# Patient Record
Sex: Female | Born: 1955 | Race: White | Hispanic: No | Marital: Married | Smoking: Never smoker
Health system: Southern US, Community
[De-identification: ages and names within clinical notes are randomized; demographics above are authoritative.]

## PROBLEM LIST (undated history)

## (undated) DIAGNOSIS — R12 Heartburn: Secondary | ICD-10-CM

## (undated) DIAGNOSIS — E785 Hyperlipidemia, unspecified: Secondary | ICD-10-CM

## (undated) DIAGNOSIS — T7840XA Allergy, unspecified, initial encounter: Secondary | ICD-10-CM

## (undated) DIAGNOSIS — F419 Anxiety disorder, unspecified: Secondary | ICD-10-CM

## (undated) DIAGNOSIS — K5790 Diverticulosis of intestine, part unspecified, without perforation or abscess without bleeding: Secondary | ICD-10-CM

## (undated) DIAGNOSIS — F909 Attention-deficit hyperactivity disorder, unspecified type: Secondary | ICD-10-CM

## (undated) DIAGNOSIS — K635 Polyp of colon: Secondary | ICD-10-CM

## (undated) DIAGNOSIS — K219 Gastro-esophageal reflux disease without esophagitis: Secondary | ICD-10-CM

## (undated) HISTORY — PX: UPPER GI ENDOSCOPY: SHX6162

## (undated) HISTORY — DX: Hyperlipidemia, unspecified: E78.5

## (undated) HISTORY — DX: Anxiety disorder, unspecified: F41.9

## (undated) HISTORY — PX: TOE SURGERY: SHX1073

## (undated) HISTORY — DX: Heartburn: R12

## (undated) HISTORY — DX: Gastro-esophageal reflux disease without esophagitis: K21.9

## (undated) HISTORY — PX: COLONOSCOPY: SHX174

## (undated) HISTORY — DX: Allergy, unspecified, initial encounter: T78.40XA

---

## 2005-02-05 ENCOUNTER — Ambulatory Visit: Payer: Self-pay | Admitting: Internal Medicine

## 2006-05-20 ENCOUNTER — Ambulatory Visit: Payer: Self-pay | Admitting: Internal Medicine

## 2007-10-14 ENCOUNTER — Ambulatory Visit: Payer: Self-pay | Admitting: Internal Medicine

## 2008-04-22 ENCOUNTER — Ambulatory Visit: Payer: Self-pay | Admitting: Unknown Physician Specialty

## 2009-10-04 ENCOUNTER — Ambulatory Visit: Payer: Self-pay | Admitting: Internal Medicine

## 2010-06-18 ENCOUNTER — Ambulatory Visit: Payer: Self-pay | Admitting: Unknown Physician Specialty

## 2010-06-20 LAB — PATHOLOGY REPORT

## 2010-11-05 ENCOUNTER — Ambulatory Visit: Payer: Self-pay

## 2011-02-08 ENCOUNTER — Ambulatory Visit: Payer: Self-pay | Admitting: Internal Medicine

## 2011-03-05 ENCOUNTER — Ambulatory Visit: Payer: Self-pay

## 2011-05-31 ENCOUNTER — Ambulatory Visit: Payer: Self-pay | Admitting: Internal Medicine

## 2011-06-03 ENCOUNTER — Ambulatory Visit: Payer: Self-pay | Admitting: Unknown Physician Specialty

## 2012-03-24 ENCOUNTER — Ambulatory Visit: Payer: Self-pay

## 2012-04-01 ENCOUNTER — Ambulatory Visit: Payer: Self-pay

## 2013-09-03 ENCOUNTER — Ambulatory Visit: Payer: Self-pay | Admitting: Internal Medicine

## 2014-01-07 ENCOUNTER — Ambulatory Visit: Payer: Self-pay

## 2014-05-24 ENCOUNTER — Ambulatory Visit: Payer: Self-pay

## 2017-03-17 ENCOUNTER — Other Ambulatory Visit: Payer: Self-pay | Admitting: Nurse Practitioner

## 2017-03-17 DIAGNOSIS — Z1231 Encounter for screening mammogram for malignant neoplasm of breast: Secondary | ICD-10-CM

## 2017-07-02 ENCOUNTER — Other Ambulatory Visit: Payer: Self-pay | Admitting: Nurse Practitioner

## 2017-07-02 ENCOUNTER — Ambulatory Visit: Payer: Managed Care, Other (non HMO) | Admitting: Internal Medicine

## 2017-07-02 DIAGNOSIS — K219 Gastro-esophageal reflux disease without esophagitis: Secondary | ICD-10-CM

## 2017-07-02 MED ORDER — PANTOPRAZOLE SODIUM 40 MG PO TBEC: 40.0000 mg | DELAYED_RELEASE_TABLET | Freq: Every day | ORAL | 3 refills | Status: DC

## 2017-07-02 NOTE — Progress Notes (Signed)
Added pantoprazole 40mg  daily per [facility] prescription. Sent to [facility].

## 2017-07-11 ENCOUNTER — Ambulatory Visit: Payer: Self-pay | Admitting: Nurse Practitioner

## 2017-07-21 ENCOUNTER — Encounter: Payer: Self-pay | Admitting: Nurse Practitioner

## 2017-07-21 ENCOUNTER — Ambulatory Visit (INDEPENDENT_AMBULATORY_CARE_PROVIDER_SITE_OTHER): Payer: Managed Care, Other (non HMO) | Admitting: Nurse Practitioner

## 2017-07-21 VITALS — BP 133/85 | HR 64 | Resp 16 | Ht 62.0 in | Wt 178.0 lb

## 2017-07-21 DIAGNOSIS — N390 Urinary tract infection, site not specified: Secondary | ICD-10-CM

## 2017-07-21 DIAGNOSIS — R41840 Attention and concentration deficit: Secondary | ICD-10-CM

## 2017-07-21 DIAGNOSIS — F411 Generalized anxiety disorder: Secondary | ICD-10-CM | POA: Diagnosis not present

## 2017-07-21 DIAGNOSIS — R319 Hematuria, unspecified: Secondary | ICD-10-CM | POA: Diagnosis not present

## 2017-07-21 DIAGNOSIS — R300 Dysuria: Secondary | ICD-10-CM

## 2017-07-21 DIAGNOSIS — R5383 Other fatigue: Secondary | ICD-10-CM

## 2017-07-21 DIAGNOSIS — M545 Low back pain, unspecified: Secondary | ICD-10-CM

## 2017-07-21 DIAGNOSIS — N3001 Acute cystitis with hematuria: Secondary | ICD-10-CM

## 2017-07-21 DIAGNOSIS — E782 Mixed hyperlipidemia: Secondary | ICD-10-CM

## 2017-07-21 LAB — POCT URINALYSIS DIPSTICK
BILIRUBIN UA: NEGATIVE
GLUCOSE UA: NEGATIVE
KETONES UA: NEGATIVE
Leukocytes, UA: NEGATIVE
Nitrite, UA: NEGATIVE
SPEC GRAV UA: 1.01 (ref 1.010–1.025)
Urobilinogen, UA: 0.2 E.U./dL
pH, UA: 5 (ref 5.0–8.0)

## 2017-07-21 MED ORDER — FENOFIBRATE 54 MG PO TABS: 54.0000 mg | ORAL_TABLET | Freq: Every day | ORAL | 5 refills | Status: DC

## 2017-07-21 MED ORDER — AMPHETAMINE-DEXTROAMPHETAMINE 5 MG PO TABS
5.0000 mg | ORAL_TABLET | Freq: Two times a day (BID) | ORAL | 0 refills | Status: DC
Start: 2017-07-21 — End: 2017-07-21

## 2017-07-21 MED ORDER — ATORVASTATIN CALCIUM 40 MG PO TABS: 40.0000 mg | ORAL_TABLET | Freq: Every day | ORAL | 5 refills | Status: DC

## 2017-07-21 MED ORDER — TRAMADOL HCL 50 MG PO TABS: 50.0000 mg | ORAL_TABLET | Freq: Four times a day (QID) | ORAL | 0 refills | Status: DC | PRN

## 2017-07-21 MED ORDER — ESCITALOPRAM OXALATE 20 MG PO TABS: 20.0000 mg | ORAL_TABLET | Freq: Every day | ORAL | 5 refills | Status: DC

## 2017-07-21 MED ORDER — AMPHETAMINE-DEXTROAMPHETAMINE 5 MG PO TABS: 5.0000 mg | ORAL_TABLET | Freq: Two times a day (BID) | ORAL | 0 refills | Status: DC

## 2017-07-21 MED ORDER — AMOXICILLIN-POT CLAVULANATE 875-125 MG PO TABS: 1.0000 | ORAL_TABLET | Freq: Two times a day (BID) | ORAL | 0 refills | Status: DC

## 2017-07-21 MED ORDER — CLONAZEPAM 0.5 MG PO TABS: 0.5000 mg | ORAL_TABLET | Freq: Two times a day (BID) | ORAL | 2 refills | Status: DC | PRN

## 2017-07-21 NOTE — Progress Notes (Signed)
[facility] [place], [place] [address]  Internal MEDICINE  Office Visit Note  Patient Name: [name]  [id number]  [id number]  Date of Service: 07/30/2017  Chief Complaint  Patient presents with  . Back Pain  . Urinary Tract Infection    Back Pain  This is a recurrent problem. The current episode started more than 1 month ago. The problem occurs constantly. The problem has been gradually worsening since onset. The pain is present in the lumbar spine. The quality of the pain is described as aching. The pain radiates to the left thigh and right thigh. Exacerbated by: exertion. Stiffness is present in the morning. Associated symptoms include dysuria, headaches, leg pain and paresthesias. Pertinent negatives include no abdominal pain, chest pain or numbness. (Bilateral knee/thigh pain) Risk factors include menopause. She has tried NSAIDs for the symptoms. The treatment provided moderate relief.  Urinary Tract Infection   This is a recurrent problem. The current episode started in the past 7 days. The problem occurs intermittently. The problem has been gradually worsening. There has been no fever. She is sexually active. There is no history of pyelonephritis. Associated symptoms include flank pain, frequency, hesitancy and nausea. Pertinent negatives include no chills or vomiting. She has tried nothing for the symptoms. Her past medical history is significant for recurrent UTIs.    Pt is here for routine follow up.    Current Medication: Outpatient Encounter Medications as of 07/21/2017  Medication Sig  . amphetamine-dextroamphetamine (ADDERALL) 5 MG tablet Take 1 tablet (5 mg total) by mouth 2 (two) times daily with a meal.  . atorvastatin (LIPITOR) 40 MG tablet Take 1 tablet (40 mg total) by mouth daily at 6 PM.  . clonazePAM (KLONOPIN) 0.5 MG tablet Take 1 tablet (0.5 mg total) by mouth 2 (two) times daily as needed for anxiety.  . cyclobenzaprine (FLEXERIL)  10 MG tablet cyclobenzaprine 10 mg tablet  1 POQ 8 hrs prn muscle tightness  . escitalopram (LEXAPRO) 20 MG tablet Take 1 tablet (20 mg total) by mouth daily.  . fenofibrate 54 MG tablet Take 1 tablet (54 mg total) by mouth daily.  . fluticasone (FLONASE) 50 MCG/ACT nasal spray fluticasone 50 mcg/actuation nasal spray,suspension  . pantoprazole (PROTONIX) 40 MG tablet Take 1 tablet (40 mg total) by mouth daily.  . [DISCONTINUED] amphetamine-dextroamphetamine (ADDERALL) 5 MG tablet dextroamphetamine-amphetamine 5 mg tablet  . [DISCONTINUED] amphetamine-dextroamphetamine (ADDERALL) 5 MG tablet Take 1 tablet (5 mg total) by mouth 2 (two) times daily with a meal.  . [DISCONTINUED] amphetamine-dextroamphetamine (ADDERALL) 5 MG tablet Take 1 tablet (5 mg total) by mouth 2 (two) times daily with a meal.  . [DISCONTINUED] atorvastatin (LIPITOR) 40 MG tablet atorvastatin 40 mg tablet  . [DISCONTINUED] clonazePAM (KLONOPIN) 0.5 MG tablet clonazepam 0.5 mg tablet  . [DISCONTINUED] escitalopram (LEXAPRO) 20 MG tablet escitalopram 20 mg tablet  . [DISCONTINUED] fenofibrate 54 MG tablet fenofibrate 54 mg tablet  . amoxicillin-clavulanate (AUGMENTIN) 875-125 MG tablet Take 1 tablet by mouth 2 (two) times daily.  . traMADol (ULTRAM) 50 MG tablet Take 1 tablet (50 mg total) by mouth every 6 (six) hours as needed for moderate pain.   No facility-administered encounter medications on file as of 07/21/2017.     Surgical History: Past Surgical History:  Procedure Laterality Date  . TOE SURGERY  2005,2011    Medical History: Past Medical History:  Diagnosis Date  . Allergy   . Heart burn   . Hyperlipidemia  Family History: Family History  Problem Relation Age of Onset  . Lung cancer Mother   . Heart disease Father   . Pancreatic cancer Sister     Social History   Socioeconomic History  . Marital status: Married    Spouse name: Not on file  . Number of children: Not on file  . Years of  education: Not on file  . Highest education level: Not on file  Social Needs  . Financial resource strain: Not on file  . Food insecurity - worry: Not on file  . Food insecurity - inability: Not on file  . Transportation needs - medical: Not on file  . Transportation needs - non-medical: Not on file  Occupational History  . Not on file  Tobacco Use  . Smoking status: Never Smoker  . Smokeless tobacco: Never Used  Substance and Sexual Activity  . Alcohol use: No    Frequency: Never  . Drug use: No  . Sexual activity: Not on file  Other Topics Concern  . Not on file  Social History Narrative  . Not on file      Review of Systems  Constitutional: Positive for activity change. Negative for chills, fatigue and unexpected weight change.  HENT: Negative for congestion, postnasal drip, rhinorrhea, sneezing and sore throat.   Eyes: Negative for redness.  Respiratory: Negative for cough, chest tightness, shortness of breath and wheezing.   Cardiovascular: Negative for chest pain and palpitations.  Gastrointestinal: Positive for nausea. Negative for abdominal pain, constipation, diarrhea and vomiting.  Genitourinary: Positive for dysuria, flank pain, frequency and hesitancy.  Musculoskeletal: Positive for arthralgias and back pain. Negative for joint swelling and neck pain.       Bilateral knee pain   Skin: Negative for rash.  Neurological: Positive for headaches and paresthesias. Negative for tremors and numbness.  Hematological: Negative for adenopathy. Does not bruise/bleed easily.  Psychiatric/Behavioral: Positive for behavioral problems (Depression) and decreased concentration. Negative for sleep disturbance and suicidal ideas. The patient is nervous/anxious.    Today's Vitals   07/21/17 1520  BP: 133/85  Pulse: 64  Resp: 16  SpO2: 95%  Weight: 178 lb (80.7 kg)  Height: 5\' 2"  (1.575 m)   . Physical Exam  Constitutional: She is oriented to person, place, and time. She  appears well-developed and well-nourished. No distress.  HENT:  Head: Normocephalic and atraumatic.  Mouth/Throat: Oropharynx is clear and moist. No oropharyngeal exudate.  Eyes: EOM are normal. Pupils are equal, round, and reactive to light.  Neck: Normal range of motion. Neck supple. No JVD present. No tracheal deviation present. No thyromegaly present.  Cardiovascular: Normal rate, regular rhythm and normal heart sounds. Exam reveals no gallop and no friction rub.  No murmur heard. Pulmonary/Chest: Effort normal and breath sounds normal. No respiratory distress. She has no wheezes. She has no rales. She exhibits no tenderness.  Abdominal: Soft. Bowel sounds are normal. There is no tenderness.  Genitourinary:  Genitourinary Comments: Urine sample positive for trace WBC and moderate blood.  Musculoskeletal:  Moderate lower back pain, more severe with bending and twisting at the waist. Radiates to the hips. No viaible or palpable abnormalities or deformities noted today.  Lymphadenopathy:    She has no cervical adenopathy.  Neurological: She is alert and oriented to person, place, and time. No cranial nerve deficit.  Skin: Skin is warm and dry. She is not diaphoretic.  Psychiatric: Her behavior is normal. Judgment and thought content normal. Her mood appears anxious.  Cognition and memory are normal.  Nursing note and vitals reviewed.  Assessment/Plan: 1. Urinary tract infection with hematuria, site unspecified Start abx bid for 10 days. Send urine for culture and sensitivity and adjust abx as indicated.  - CULTURE, URINE COMPREHENSIVE  2. Acute cystitis with hematuria - amoxicillin-clavulanate (AUGMENTIN) 875-125 MG tablet; Take 1 tablet by mouth 2 (two) times daily.  Dispense: 20 tablet; Refill: 0  3. Low back pain at multiple sites Likely related to UTI. May take tramadol as needed and as prescribed.  - traMADol (ULTRAM) 50 MG tablet; Take 1 tablet (50 mg total) by mouth every 6  (six) hours as needed for moderate pain.  Dispense: 30 tablet; Refill: 0 - CBC with Differential/Platelet  4. Dysuria - POCT Urinalysis Dipstick  5. Mixed hyperlipidemia - atorvastatin (LIPITOR) 40 MG tablet; Take 1 tablet (40 mg total) by mouth daily at 6 PM.  Dispense: 30 tablet; Refill: 5 - fenofibrate 54 MG tablet; Take 1 tablet (54 mg total) by mouth daily.  Dispense: 30 tablet; Refill: 5 - CBC with Differential/Platelet - Comprehensive metabolic panel - T4, free - Lipid panel  6. Attention and concentration deficit May continue adderall 5mg  bid as needed and as prescribed. Three 30 day prescriptions provided today. Dates are 07/21/2017, 08/18/2017, and 4/16/20169 - amphetamine-dextroamphetamine (ADDERALL) 5 MG tablet; Take 1 tablet (5 mg total) by mouth 2 (two) times daily with a meal.  Dispense: 60 tablet; Refill: 0  7. GAD (generalized anxiety disorder) Stable.  - clonazePAM (KLONOPIN) 0.5 MG tablet; Take 1 tablet (0.5 mg total) by mouth 2 (two) times daily as needed for anxiety.  Dispense: 60 tablet; Refill: 2 - escitalopram (LEXAPRO) 20 MG tablet; Take 1 tablet (20 mg total) by mouth daily.  Dispense: 30 tablet; Refill: 5  8. Other fatigue Check labs  - T4, free - TSH  General Counseling: Tempie verbalizes understanding of the findings of todays visit and agrees with plan of treatment. I have discussed any further diagnostic evaluation that may be needed or ordered today. We also reviewed her medications today. she has been encouraged to call the office with any questions or concerns that should arise related to todays visit.   This patient was seen by Leretha Pol, FNP- C in Collaboration with Dr Lavera Guise as a part of collaborative care agreement    Orders Placed This Encounter  Procedures  . CULTURE, URINE COMPREHENSIVE  . CBC with Differential/Platelet  . Comprehensive metabolic panel  . T4, free  . TSH  . Lipid panel  . POCT Urinalysis Dipstick    Meds  ordered this encounter  Medications  . DISCONTD: amphetamine-dextroamphetamine (ADDERALL) 5 MG tablet    Sig: Take 1 tablet (5 mg total) by mouth 2 (two) times daily with a meal.    Dispense:  60 tablet    Refill:  0    Order Specific Question:   Supervising Provider    Answer:   Lavera Guise [place]  . clonazePAM (KLONOPIN) 0.5 MG tablet    Sig: Take 1 tablet (0.5 mg total) by mouth 2 (two) times daily as needed for anxiety.    Dispense:  60 tablet    Refill:  2    Order Specific Question:   Supervising Provider    Answer:   Lavera Guise [[id number]]  . atorvastatin (LIPITOR) 40 MG tablet    Sig: Take 1 tablet (40 mg total) by mouth daily at 6 PM.    Dispense:  30  tablet    Refill:  5    Order Specific Question:   Supervising Provider    Answer:   Lavera Guise [[id number]]  . escitalopram (LEXAPRO) 20 MG tablet    Sig: Take 1 tablet (20 mg total) by mouth daily.    Dispense:  30 tablet    Refill:  5    Order Specific Question:   Supervising Provider    Answer:   Lavera Guise [[id number]]  . fenofibrate 54 MG tablet    Sig: Take 1 tablet (54 mg total) by mouth daily.    Dispense:  30 tablet    Refill:  5    Order Specific Question:   Supervising Provider    Answer:   Lavera Guise [[id number]]  . amoxicillin-clavulanate (AUGMENTIN) 875-125 MG tablet    Sig: Take 1 tablet by mouth 2 (two) times daily.    Dispense:  20 tablet    Refill:  0    Order Specific Question:   Supervising Provider    Answer:   Lavera Guise [[id number]]  . traMADol (ULTRAM) 50 MG tablet    Sig: Take 1 tablet (50 mg total) by mouth every 6 (six) hours as needed for moderate pain.    Dispense:  30 tablet    Refill:  0    Order Specific Question:   Supervising Provider    Answer:   Lavera Guise [[id number]]  . DISCONTD: amphetamine-dextroamphetamine (ADDERALL) 5 MG tablet    Sig: Take 1 tablet (5 mg total) by mouth 2 (two) times daily with a meal.    Dispense:  60 tablet    Refill:  0    Fill after 08/18/2017    Order Specific  Question:   Supervising Provider    Answer:   Lavera Guise [place]  . amphetamine-dextroamphetamine (ADDERALL) 5 MG tablet    Sig: Take 1 tablet (5 mg total) by mouth 2 (two) times daily with a meal.    Dispense:  60 tablet    Refill:  0    Fill after 09/16/2017    Order Specific Question:   Supervising Provider    Answer:   Lavera Guise [[id number]]    Time spent: 25 Minutes     Dr Lavera Guise Internal medicine

## 2017-07-25 LAB — CULTURE, URINE COMPREHENSIVE

## 2017-07-30 ENCOUNTER — Encounter: Payer: Self-pay | Admitting: Nurse Practitioner

## 2017-07-30 DIAGNOSIS — M545 Low back pain, unspecified: Secondary | ICD-10-CM | POA: Insufficient documentation

## 2017-07-30 DIAGNOSIS — F411 Generalized anxiety disorder: Secondary | ICD-10-CM | POA: Insufficient documentation

## 2017-07-30 DIAGNOSIS — N3001 Acute cystitis with hematuria: Secondary | ICD-10-CM | POA: Insufficient documentation

## 2017-07-30 DIAGNOSIS — R300 Dysuria: Secondary | ICD-10-CM | POA: Insufficient documentation

## 2017-07-30 DIAGNOSIS — E782 Mixed hyperlipidemia: Secondary | ICD-10-CM | POA: Insufficient documentation

## 2017-07-30 DIAGNOSIS — R319 Hematuria, unspecified: Secondary | ICD-10-CM

## 2017-07-30 DIAGNOSIS — N390 Urinary tract infection, site not specified: Secondary | ICD-10-CM | POA: Insufficient documentation

## 2017-07-30 DIAGNOSIS — R41840 Attention and concentration deficit: Secondary | ICD-10-CM | POA: Insufficient documentation

## 2017-07-30 DIAGNOSIS — R5383 Other fatigue: Secondary | ICD-10-CM | POA: Insufficient documentation

## 2017-08-26 LAB — COMPREHENSIVE METABOLIC PANEL
ALBUMIN: 4.3 g/dL (ref 3.6–4.8)
ALT: 23 IU/L (ref 0–32)
AST: 20 IU/L (ref 0–40)
Albumin/Globulin Ratio: 2 (ref 1.2–2.2)
Alkaline Phosphatase: 83 IU/L (ref 39–117)
BUN/Creatinine Ratio: 22 (ref 12–28)
BUN: 14 mg/dL (ref 8–27)
CALCIUM: 9.1 mg/dL (ref 8.7–10.3)
CHLORIDE: 104 mmol/L (ref 96–106)
CO2: 22 mmol/L (ref 20–29)
CREATININE: 0.65 mg/dL (ref 0.57–1.00)
GFR, EST AFRICAN AMERICAN: 110 mL/min/{1.73_m2} (ref >59)
GFR, EST NON AFRICAN AMERICAN: 95 mL/min/{1.73_m2} (ref >59)
GLUCOSE: 101 mg/dL — AB (ref 65–99)
Globulin, Total: 2.1 g/dL (ref 1.5–4.5)
Potassium: 4.4 mmol/L (ref 3.5–5.2)
Sodium: 140 mmol/L (ref 134–144)
TOTAL PROTEIN: 6.4 g/dL (ref 6.0–8.5)

## 2017-08-26 LAB — LIPID PANEL
Chol/HDL Ratio: 4.3 ratio (ref 0.0–4.4)
Cholesterol, Total: 188 mg/dL (ref 100–199)
HDL: 44 mg/dL (ref >39)
LDL CALC: 86 mg/dL (ref 0–99)
TRIGLYCERIDES: 291 mg/dL — AB (ref 0–149)
VLDL CHOLESTEROL CAL: 58 mg/dL — AB (ref 5–40)

## 2017-08-26 LAB — CBC WITH DIFFERENTIAL/PLATELET
BASOS ABS: 0 10*3/uL (ref 0.0–0.2)
Basos: 0 %
EOS (ABSOLUTE): 0.2 10*3/uL (ref 0.0–0.4)
Eos: 3 %
HEMOGLOBIN: 14.3 g/dL (ref 11.1–15.9)
Hematocrit: 41.5 % (ref 34.0–46.6)
IMMATURE GRANS (ABS): 0 10*3/uL (ref 0.0–0.1)
IMMATURE GRANULOCYTES: 1 %
LYMPHS: 39 %
Lymphocytes Absolute: 2.6 10*3/uL (ref 0.7–3.1)
MCH: 31.3 pg (ref 26.6–33.0)
MCHC: 34.5 g/dL (ref 31.5–35.7)
MCV: 91 fL (ref 79–97)
MONOCYTES: 8 %
Monocytes Absolute: 0.5 10*3/uL (ref 0.1–0.9)
NEUTROS ABS: 3.4 10*3/uL (ref 1.4–7.0)
NEUTROS PCT: 49 %
PLATELETS: 300 10*3/uL (ref 150–379)
RBC: 4.57 x10E6/uL (ref 3.77–5.28)
RDW: 13.8 % (ref 12.3–15.4)
WBC: 6.8 10*3/uL (ref 3.4–10.8)

## 2017-08-26 LAB — TSH: TSH: 0.978 u[IU]/mL (ref 0.450–4.500)

## 2017-08-26 LAB — T4, FREE: Free T4: 0.93 ng/dL (ref 0.82–1.77)

## 2017-09-02 ENCOUNTER — Other Ambulatory Visit: Payer: Self-pay | Admitting: Nurse Practitioner

## 2017-09-02 DIAGNOSIS — R112 Nausea with vomiting, unspecified: Secondary | ICD-10-CM

## 2017-09-08 ENCOUNTER — Ambulatory Visit: Payer: Managed Care, Other (non HMO)

## 2017-10-20 ENCOUNTER — Ambulatory Visit (INDEPENDENT_AMBULATORY_CARE_PROVIDER_SITE_OTHER): Payer: Managed Care, Other (non HMO) | Admitting: Nurse Practitioner

## 2017-10-20 VITALS — BP 129/85 | HR 66 | Resp 16 | Ht 62.0 in | Wt 179.2 lb

## 2017-10-20 DIAGNOSIS — F411 Generalized anxiety disorder: Secondary | ICD-10-CM | POA: Diagnosis not present

## 2017-10-20 DIAGNOSIS — K219 Gastro-esophageal reflux disease without esophagitis: Secondary | ICD-10-CM

## 2017-10-20 DIAGNOSIS — E782 Mixed hyperlipidemia: Secondary | ICD-10-CM | POA: Diagnosis not present

## 2017-10-20 DIAGNOSIS — R41840 Attention and concentration deficit: Secondary | ICD-10-CM | POA: Diagnosis not present

## 2017-10-20 MED ORDER — ESCITALOPRAM OXALATE 20 MG PO TABS: 20.0000 mg | ORAL_TABLET | Freq: Every day | ORAL | 5 refills | Status: DC

## 2017-10-20 MED ORDER — AMPHETAMINE-DEXTROAMPHETAMINE 5 MG PO TABS: 5.0000 mg | ORAL_TABLET | Freq: Two times a day (BID) | ORAL | 0 refills | Status: DC

## 2017-10-20 MED ORDER — CLONAZEPAM 0.5 MG PO TABS: 0.5000 mg | ORAL_TABLET | Freq: Two times a day (BID) | ORAL | 2 refills | Status: DC | PRN

## 2017-10-20 MED ORDER — ATORVASTATIN CALCIUM 40 MG PO TABS: 40.0000 mg | ORAL_TABLET | Freq: Every day | ORAL | 5 refills | Status: DC

## 2017-10-20 MED ORDER — AMPHETAMINE-DEXTROAMPHETAMINE 5 MG PO TABS
5.0000 mg | ORAL_TABLET | Freq: Two times a day (BID) | ORAL | 0 refills | Status: DC
Start: 2017-10-20 — End: 2018-01-30

## 2017-10-20 NOTE — Progress Notes (Signed)
[facility] [place], [place] [address]  Internal MEDICINE  Office Visit Note  Patient Name: [name]  [id number]  [id number]  Date of Service: 11/12/2017   Pt is here for routine follow up.   Chief Complaint  Patient presents with  . Hyperlipidemia  . Anxiety    The patient has long history of anxiety/panic attacks. She is currently taking lexapro 20mg  daily and will use clonazpeam twice daily as needed for acute anxiety. She takes this mostly in the evening if needed.  She recently had labs done. Her triglycerides were elevated. She admits she has not been taking her medications to control cholesterol  Hyperlipidemia  This is a chronic problem. The current episode started more than 1 year ago. The problem is resistant. Recent lipid tests were reviewed and are high. There are no known factors aggravating her hyperlipidemia. Pertinent negatives include no chest pain or shortness of breath. Current antihyperlipidemic treatment includes fibric acid derivatives and statins. The current treatment provides mild improvement of lipids. Compliance problems include adherence to diet and adherence to exercise.  Risk factors for coronary artery disease include dyslipidemia and post-menopausal.  Anxiety  Presents for follow-up visit. Symptoms include decreased concentration and nervous/anxious behavior. Patient reports no chest pain, nausea, palpitations, shortness of breath or suicidal ideas.         Current Medication: Outpatient Encounter Medications as of 10/20/2017  Medication Sig  . amphetamine-dextroamphetamine (ADDERALL) 5 MG tablet Take 1 tablet (5 mg total) by mouth 2 (two) times daily with a meal.  . atorvastatin (LIPITOR) 40 MG tablet Take 1 tablet (40 mg total) by mouth daily at 6 PM.  . clonazePAM (KLONOPIN) 0.5 MG tablet Take 1 tablet (0.5 mg total) by mouth 2 (two) times daily as needed for anxiety.  Marland Kitchen escitalopram (LEXAPRO) 20 MG tablet Take 1  tablet (20 mg total) by mouth daily.  . fenofibrate 54 MG tablet Take 1 tablet (54 mg total) by mouth daily.  . fluticasone (FLONASE) 50 MCG/ACT nasal spray fluticasone 50 mcg/actuation nasal spray,suspension  . [DISCONTINUED] amphetamine-dextroamphetamine (ADDERALL) 5 MG tablet Take 1 tablet (5 mg total) by mouth 2 (two) times daily with a meal.  . [DISCONTINUED] amphetamine-dextroamphetamine (ADDERALL) 5 MG tablet Take 1 tablet (5 mg total) by mouth 2 (two) times daily with a meal.  . [DISCONTINUED] amphetamine-dextroamphetamine (ADDERALL) 5 MG tablet Take 1 tablet (5 mg total) by mouth 2 (two) times daily with a meal.  . [DISCONTINUED] atorvastatin (LIPITOR) 40 MG tablet Take 1 tablet (40 mg total) by mouth daily at 6 PM.  . [DISCONTINUED] clonazePAM (KLONOPIN) 0.5 MG tablet Take 1 tablet (0.5 mg total) by mouth 2 (two) times daily as needed for anxiety.  . [DISCONTINUED] escitalopram (LEXAPRO) 20 MG tablet Take 1 tablet (20 mg total) by mouth daily.  Marland Kitchen amoxicillin-clavulanate (AUGMENTIN) 875-125 MG tablet Take 1 tablet by mouth 2 (two) times daily. (Patient not taking: Reported on 10/20/2017)  . cyclobenzaprine (FLEXERIL) 10 MG tablet cyclobenzaprine 10 mg tablet  1 POQ 8 hrs prn muscle tightness  . pantoprazole (PROTONIX) 40 MG tablet Take 1 tablet (40 mg total) by mouth daily. (Patient not taking: Reported on 10/20/2017)  . traMADol (ULTRAM) 50 MG tablet Take 1 tablet (50 mg total) by mouth every 6 (six) hours as needed for moderate pain. (Patient not taking: Reported on 10/20/2017)   No facility-administered encounter medications on file as of 10/20/2017.     Surgical History: Past Surgical History:  Procedure  Laterality Date  . TOE SURGERY  2005,2011    Medical History: Past Medical History:  Diagnosis Date  . Allergy   . Anxiety   . GERD (gastroesophageal reflux disease)   . Heart burn   . Hyperlipidemia     Family History: Family History  Problem Relation Age of Onset  .  Lung cancer Mother   . Heart disease Father   . Pancreatic cancer Sister     Social History   Socioeconomic History  . Marital status: Married    Spouse name: Not on file  . Number of children: Not on file  . Years of education: Not on file  . Highest education level: Not on file  Occupational History  . Not on file  Social Needs  . Financial resource strain: Not on file  . Food insecurity:    Worry: Not on file    Inability: Not on file  . Transportation needs:    Medical: Not on file    Non-medical: Not on file  Tobacco Use  . Smoking status: Never Smoker  . Smokeless tobacco: Never Used  Substance and Sexual Activity  . Alcohol use: No    Frequency: Never  . Drug use: No  . Sexual activity: Not on file  Lifestyle  . Physical activity:    Days per week: Not on file    Minutes per session: Not on file  . Stress: Not on file  Relationships  . Social connections:    Talks on phone: Not on file    Gets together: Not on file    Attends religious service: Not on file    Active member of club or organization: Not on file    Attends meetings of clubs or organizations: Not on file    Relationship status: Not on file  . Intimate partner violence:    Fear of current or ex partner: Not on file    Emotionally abused: Not on file    Physically abused: Not on file    Forced sexual activity: Not on file  Other Topics Concern  . Not on file  Social History Narrative  . Not on file      Review of Systems  Constitutional: Negative for activity change, chills, fatigue and unexpected weight change.  HENT: Negative for congestion, postnasal drip, rhinorrhea, sneezing and sore throat.   Eyes: Negative.  Negative for redness.  Respiratory: Negative for cough, chest tightness, shortness of breath and wheezing.   Cardiovascular: Negative for chest pain and palpitations.  Gastrointestinal: Negative for abdominal pain, constipation, diarrhea, nausea and vomiting.  Endocrine:  Negative for cold intolerance, heat intolerance, polydipsia, polyphagia and polyuria.  Genitourinary: Negative for dysuria, flank pain and frequency.  Musculoskeletal: Positive for arthralgias. Negative for back pain, joint swelling and neck pain.       Bilateral knee pain   Skin: Negative for rash.  Allergic/Immunologic: Negative for environmental allergies.  Neurological: Negative for tremors, numbness and headaches.  Hematological: Negative for adenopathy. Does not bruise/bleed easily.  Psychiatric/Behavioral: Positive for behavioral problems (Depression) and decreased concentration. Negative for sleep disturbance and suicidal ideas. The patient is nervous/anxious.     Today's Vitals   10/20/17 1536  BP: 129/85  Pulse: 66  Resp: 16  SpO2: 95%  Weight: 179 lb 3.2 oz (81.3 kg)  Height: 5\' 2"  (1.575 m)    Physical Exam  Constitutional: She is oriented to person, place, and time. She appears well-developed and well-nourished. No distress.  HENT:  Head: Normocephalic and atraumatic.  Mouth/Throat: Oropharynx is clear and moist. No oropharyngeal exudate.  Eyes: Pupils are equal, round, and reactive to light. EOM are normal.  Neck: Normal range of motion. Neck supple. No JVD present. Carotid bruit is not present. No tracheal deviation present. No thyromegaly present.  Cardiovascular: Normal rate, regular rhythm and normal heart sounds. Exam reveals no gallop and no friction rub.  No murmur heard. Pulmonary/Chest: Effort normal and breath sounds normal. No respiratory distress. She has no wheezes. She has no rales. She exhibits no tenderness.  Abdominal: Soft. Bowel sounds are normal. There is no tenderness.  Musculoskeletal:  Moderate lower back pain, more severe with bending and twisting at the waist. Radiates to the hips. No viaible or palpable abnormalities or deformities noted today.  Lymphadenopathy:    She has no cervical adenopathy.  Neurological: She is alert and oriented to  person, place, and time. No cranial nerve deficit.  Skin: Skin is warm and dry. She is not diaphoretic.  Psychiatric: Her behavior is normal. Judgment and thought content normal. Her mood appears anxious. Cognition and memory are normal.  Nursing note and vitals reviewed.  Assessment/Plan: 1. Mixed hyperlipidemia Will have her restart 40mg  every evening. Continue fenofibrate as prescribed. Recheck levels after next visit.  - atorvastatin (LIPITOR) 40 MG tablet; Take 1 tablet (40 mg total) by mouth daily at 6 PM.  Dispense: 30 tablet; Refill: 5  2. Gastroesophageal reflux disease without esophagitis Continue pantoprazole as prescribed   3. Attention and concentration deficit May continue adderall 5mg  twice daily if needed for focus/concentration. Three 30 day prescriptions were sent to her pharmacy. Dates are 10/20/2017, 11/18/2017, and 12/16/2017 - amphetamine-dextroamphetamine (ADDERALL) 5 MG tablet; Take 1 tablet (5 mg total) by mouth 2 (two) times daily with a meal.  Dispense: 60 tablet; Refill: 0  4. GAD (generalized anxiety disorder) Well managed. Continue lexapro 20mg  daily. May use clonazepam 0.5mg  twice daily if needed for acute anxiety.  - clonazePAM (KLONOPIN) 0.5 MG tablet; Take 1 tablet (0.5 mg total) by mouth 2 (two) times daily as needed for anxiety.  Dispense: 60 tablet; Refill: 2 - escitalopram (LEXAPRO) 20 MG tablet; Take 1 tablet (20 mg total) by mouth daily.  Dispense: 30 tablet; Refill: 5  General Counseling: [name] verbalizes understanding of the findings of todays visit and agrees with plan of treatment. I have discussed any further diagnostic evaluation that may be needed or ordered today. We also reviewed her medications today. she has been encouraged to call the office with any questions or concerns that should arise related to todays visit.    Counseling:  This patient was seen by Leretha Pol, FNP- C in Collaboration with Dr Lavera Guise as a part of collaborative  care agreement  Meds ordered this encounter  Medications  . DISCONTD: amphetamine-dextroamphetamine (ADDERALL) 5 MG tablet    Sig: Take 1 tablet (5 mg total) by mouth 2 (two) times daily with a meal.    Dispense:  60 tablet    Refill:  0    Order Specific Question:   Supervising Provider    Answer:   Lavera Guise [place]  . atorvastatin (LIPITOR) 40 MG tablet    Sig: Take 1 tablet (40 mg total) by mouth daily at 6 PM.    Dispense:  30 tablet    Refill:  5    Order Specific Question:   Supervising Provider    Answer:   Lavera Guise [place]  . clonazePAM (  KLONOPIN) 0.5 MG tablet    Sig: Take 1 tablet (0.5 mg total) by mouth 2 (two) times daily as needed for anxiety.    Dispense:  60 tablet    Refill:  2    Order Specific Question:   Supervising Provider    Answer:   Lavera Guise [[id number]]  . escitalopram (LEXAPRO) 20 MG tablet    Sig: Take 1 tablet (20 mg total) by mouth daily.    Dispense:  30 tablet    Refill:  5    Order Specific Question:   Supervising Provider    Answer:   Lavera Guise [[id number]]  . DISCONTD: amphetamine-dextroamphetamine (ADDERALL) 5 MG tablet    Sig: Take 1 tablet (5 mg total) by mouth 2 (two) times daily with a meal.    Dispense:  60 tablet    Refill:  0    Fill after 11/18/2017    Order Specific Question:   Supervising Provider    Answer:   Lavera Guise [place]  . amphetamine-dextroamphetamine (ADDERALL) 5 MG tablet    Sig: Take 1 tablet (5 mg total) by mouth 2 (two) times daily with a meal.    Dispense:  60 tablet    Refill:  0    Fill after 10/16/2017    Order Specific Question:   Supervising Provider    Answer:   Lavera Guise [[id number]]    Time spent: 8 Minutes          Dr Lavera Guise Internal medicine

## 2017-11-12 DIAGNOSIS — K219 Gastro-esophageal reflux disease without esophagitis: Secondary | ICD-10-CM | POA: Insufficient documentation

## 2018-01-16 ENCOUNTER — Other Ambulatory Visit: Payer: Self-pay | Admitting: Nurse Practitioner

## 2018-01-16 DIAGNOSIS — F411 Generalized anxiety disorder: Secondary | ICD-10-CM

## 2018-01-16 MED ORDER — CLONAZEPAM 0.5 MG PO TABS: 0.5000 mg | ORAL_TABLET | Freq: Two times a day (BID) | ORAL | 2 refills | Status: DC | PRN

## 2018-01-30 ENCOUNTER — Ambulatory Visit: Payer: Managed Care, Other (non HMO) | Admitting: Nurse Practitioner

## 2018-01-30 ENCOUNTER — Encounter: Payer: Self-pay | Admitting: Nurse Practitioner

## 2018-01-30 VITALS — BP 132/75 | HR 74 | Resp 16 | Ht 62.0 in | Wt 179.4 lb

## 2018-01-30 DIAGNOSIS — Z1231 Encounter for screening mammogram for malignant neoplasm of breast: Secondary | ICD-10-CM

## 2018-01-30 DIAGNOSIS — E782 Mixed hyperlipidemia: Secondary | ICD-10-CM

## 2018-01-30 DIAGNOSIS — K219 Gastro-esophageal reflux disease without esophagitis: Secondary | ICD-10-CM | POA: Diagnosis not present

## 2018-01-30 DIAGNOSIS — Z1283 Encounter for screening for malignant neoplasm of skin: Secondary | ICD-10-CM

## 2018-01-30 DIAGNOSIS — R41840 Attention and concentration deficit: Secondary | ICD-10-CM

## 2018-01-30 DIAGNOSIS — Z1239 Encounter for other screening for malignant neoplasm of breast: Secondary | ICD-10-CM

## 2018-01-30 MED ORDER — AMPHETAMINE-DEXTROAMPHETAMINE 5 MG PO TABS: 5.0000 mg | ORAL_TABLET | Freq: Two times a day (BID) | ORAL | 0 refills | Status: DC

## 2018-01-30 MED ORDER — AMPHETAMINE-DEXTROAMPHETAMINE 5 MG PO TABS
5.0000 mg | ORAL_TABLET | Freq: Two times a day (BID) | ORAL | 0 refills | Status: DC
Start: 2018-01-30 — End: 2018-01-30

## 2018-01-30 NOTE — Progress Notes (Signed)
[facility] [place], [place] [address]  Internal MEDICINE  Office Visit Note  Patient Name: [name]  [id number]  [id number]  Date of Service: 02/09/2018  Chief Complaint  Patient presents with  . Medical Management of Chronic Issues    routine 3 month follow up  . Hyperlipidemia  . Letter for School/Work    [employer] BMI form    The patient has long history of anxiety/panic attacks. She is currently taking lexapro 20mg  daily and will use clonazpeam twice daily as needed for acute anxiety. She takes this mostly in the evening if needed. Overall, she feels as though her anxiety is well managed.  She also takes adderall 5mg  twice daily to help with focus and concentration while at work. This helps to keep her on track and productive. She has no negative side effects to report, She does need to have refills for her medication today.  The patient states that she has multiple moles and skin tags, widely spread all over the body. She used to see a dermatologist once a year to have a general skin check and would like to have this again.   Hyperlipidemia  This is a chronic problem. The current episode started more than 1 year ago. The problem is resistant. Recent lipid tests were reviewed and are high. There are no known factors aggravating her hyperlipidemia. Pertinent negatives include no chest pain or shortness of breath. Current antihyperlipidemic treatment includes fibric acid derivatives and statins. The current treatment provides mild improvement of lipids. Compliance problems include adherence to diet and adherence to exercise.  Risk factors for coronary artery disease include dyslipidemia and post-menopausal.  Anxiety  Presents for follow-up visit. Symptoms include decreased concentration and nervous/anxious behavior. Patient reports no chest pain, dizziness, nausea, palpitations, shortness of breath or suicidal ideas.         Current  Medication: Outpatient Encounter Medications as of 01/30/2018  Medication Sig  . amphetamine-dextroamphetamine (ADDERALL) 5 MG tablet Take 1 tablet (5 mg total) by mouth 2 (two) times daily with a meal.  . atorvastatin (LIPITOR) 40 MG tablet Take 1 tablet (40 mg total) by mouth daily at 6 PM.  . clonazePAM (KLONOPIN) 0.5 MG tablet Take 1 tablet (0.5 mg total) by mouth 2 (two) times daily as needed for anxiety.  Marland Kitchen escitalopram (LEXAPRO) 20 MG tablet Take 1 tablet (20 mg total) by mouth daily.  Marland Kitchen etodolac (LODINE) 200 MG capsule Take 200 mg by mouth every 8 (eight) hours.  . fenofibrate 54 MG tablet Take 1 tablet (54 mg total) by mouth daily.  . fluticasone (FLONASE) 50 MCG/ACT nasal spray fluticasone 50 mcg/actuation nasal spray,suspension  . methocarbamol (ROBAXIN) 500 MG tablet Take 500 mg by mouth 4 (four) times daily. Take one tablet twice daily  . [DISCONTINUED] amphetamine-dextroamphetamine (ADDERALL) 5 MG tablet Take 1 tablet (5 mg total) by mouth 2 (two) times daily with a meal.  . [DISCONTINUED] amphetamine-dextroamphetamine (ADDERALL) 5 MG tablet Take 1 tablet (5 mg total) by mouth 2 (two) times daily with a meal.  . [DISCONTINUED] amphetamine-dextroamphetamine (ADDERALL) 5 MG tablet Take 1 tablet (5 mg total) by mouth 2 (two) times daily with a meal.  . amoxicillin-clavulanate (AUGMENTIN) 875-125 MG tablet Take 1 tablet by mouth 2 (two) times daily. (Patient not taking: Reported on 10/20/2017)  . cyclobenzaprine (FLEXERIL) 10 MG tablet cyclobenzaprine 10 mg tablet  1 POQ 8 hrs prn muscle tightness  . pantoprazole (PROTONIX) 40 MG tablet Take 1 tablet (40  mg total) by mouth daily. (Patient not taking: Reported on 10/20/2017)  . traMADol (ULTRAM) 50 MG tablet Take 1 tablet (50 mg total) by mouth every 6 (six) hours as needed for moderate pain. (Patient not taking: Reported on 10/20/2017)   No facility-administered encounter medications on file as of 01/30/2018.     Surgical History: Past  Surgical History:  Procedure Laterality Date  . TOE SURGERY  2005,2011    Medical History: Past Medical History:  Diagnosis Date  . Allergy   . Anxiety   . GERD (gastroesophageal reflux disease)   . Heart burn   . Hyperlipidemia     Family History: Family History  Problem Relation Age of Onset  . Lung cancer Mother   . Heart disease Father   . Pancreatic cancer Sister     Social History   Socioeconomic History  . Marital status: Married    Spouse name: Not on file  . Number of children: Not on file  . Years of education: Not on file  . Highest education level: Not on file  Occupational History  . Not on file  Social Needs  . Financial resource strain: Not on file  . Food insecurity:    Worry: Not on file    Inability: Not on file  . Transportation needs:    Medical: Not on file    Non-medical: Not on file  Tobacco Use  . Smoking status: Never Smoker  . Smokeless tobacco: Never Used  Substance and Sexual Activity  . Alcohol use: No    Frequency: Never  . Drug use: No  . Sexual activity: Not on file  Lifestyle  . Physical activity:    Days per week: Not on file    Minutes per session: Not on file  . Stress: Not on file  Relationships  . Social connections:    Talks on phone: Not on file    Gets together: Not on file    Attends religious service: Not on file    Active member of club or organization: Not on file    Attends meetings of clubs or organizations: Not on file    Relationship status: Not on file  . Intimate partner violence:    Fear of current or ex partner: Not on file    Emotionally abused: Not on file    Physically abused: Not on file    Forced sexual activity: Not on file  Other Topics Concern  . Not on file  Social History Narrative  . Not on file      Review of Systems  Constitutional: Negative for activity change, chills, fatigue and unexpected weight change.  HENT: Negative for congestion, postnasal drip, rhinorrhea, sneezing  and sore throat.   Eyes: Negative.  Negative for redness.  Respiratory: Negative for cough, chest tightness, shortness of breath and wheezing.   Cardiovascular: Negative for chest pain and palpitations.  Gastrointestinal: Negative for abdominal pain, constipation, diarrhea, nausea and vomiting.  Endocrine: Negative for cold intolerance, heat intolerance, polydipsia, polyphagia and polyuria.  Genitourinary: Negative for dysuria, flank pain and frequency.  Musculoskeletal: Negative for arthralgias, back pain, joint swelling and neck pain.  Skin: Negative for rash.  Allergic/Immunologic: Negative for environmental allergies.  Neurological: Negative for dizziness, tremors, numbness and headaches.  Hematological: Negative for adenopathy. Does not bruise/bleed easily.  Psychiatric/Behavioral: Positive for behavioral problems (Depression) and decreased concentration. Negative for sleep disturbance and suicidal ideas. The patient is nervous/anxious.     Today's Vitals   01/30/18  1510  BP: 132/75  Pulse: 74  Resp: 16  SpO2: 96%  Weight: 179 lb 6.4 oz (81.4 kg)  Height: 5\' 2"  (1.575 m)    Physical Exam  Constitutional: She is oriented to person, place, and time. She appears well-developed and well-nourished. No distress.  HENT:  Head: Normocephalic and atraumatic.  Nose: Nose normal.  Mouth/Throat: Oropharynx is clear and moist. No oropharyngeal exudate.  Eyes: Pupils are equal, round, and reactive to light. Conjunctivae and EOM are normal.  Neck: Normal range of motion. Neck supple. No JVD present. Carotid bruit is not present. No tracheal deviation present. No thyromegaly present.  Cardiovascular: Normal rate, regular rhythm and normal heart sounds. Exam reveals no gallop and no friction rub.  No murmur heard. Pulmonary/Chest: Effort normal and breath sounds normal. No respiratory distress. She has no wheezes. She has no rales. She exhibits no tenderness.  Abdominal: Soft. Bowel sounds  are normal. There is no tenderness.  Musculoskeletal:  Moderate lower back pain, more severe with bending and twisting at the waist. Radiates to the hips. No viaible or palpable abnormalities or deformities noted today.  Lymphadenopathy:    She has no cervical adenopathy.  Neurological: She is alert and oriented to person, place, and time. No cranial nerve deficit.  Skin: Skin is warm and dry. She is not diaphoretic.  Psychiatric: Her behavior is normal. Judgment and thought content normal. Her mood appears anxious. Cognition and memory are normal.  Nursing note and vitals reviewed.  Assessment/Plan: 1. Gastroesophageal reflux disease without esophagitis Stable. Continue pantoprazole as prescribed.   2. Mixed hyperlipidemia Stable. Continue atorvastatin and fenofibrate as prescribed.   3. Attention and concentration deficit May continue adderall 5mg  twice daily as needed. Three 30 day prescription provided today. Dates are 01/30/2018, 02/28/2018, and 03/28/2018 - amphetamine-dextroamphetamine (ADDERALL) 5 MG tablet; Take 1 tablet (5 mg total) by mouth 2 (two) times daily with a meal.  Dispense: 60 tablet; Refill: 0  4. Screening for breast cancer - MM DIGITAL SCREENING BILATERAL; Future  5. Screening for skin cancer Refer to dermatology for general skin check.  - Ambulatory referral to Dermatology  General Counseling: Chrystian verbalizes understanding of the findings of todays visit and agrees with plan of treatment. I have discussed any further diagnostic evaluation that may be needed or ordered today. We also reviewed her medications today. she has been encouraged to call the office with any questions or concerns that should arise related to todays visit.  This patient was seen by Leretha Pol FNP Collaboration with Dr Lavera Guise as a part of collaborative care agreement  Orders Placed This Encounter  Procedures  . MM DIGITAL SCREENING BILATERAL  . Ambulatory referral to  Dermatology    Meds ordered this encounter  Medications  . DISCONTD: amphetamine-dextroamphetamine (ADDERALL) 5 MG tablet    Sig: Take 1 tablet (5 mg total) by mouth 2 (two) times daily with a meal.    Dispense:  60 tablet    Refill:  0    Order Specific Question:   Supervising Provider    Answer:   Lavera Guise [place]  . DISCONTD: amphetamine-dextroamphetamine (ADDERALL) 5 MG tablet    Sig: Take 1 tablet (5 mg total) by mouth 2 (two) times daily with a meal.    Dispense:  60 tablet    Refill:  0    Fill after 02/28/2018    Order Specific Question:   Supervising Provider    Answer:   Lavera Guise [  [id number]]  . amphetamine-dextroamphetamine (ADDERALL) 5 MG tablet    Sig: Take 1 tablet (5 mg total) by mouth 2 (two) times daily with a meal.    Dispense:  60 tablet    Refill:  0    Fill after 03/28/2018    Order Specific Question:   Supervising Provider    Answer:   Lavera Guise [[id number]]    Time spent: 56 Minutes      Dr Lavera Guise Internal medicine

## 2018-02-09 DIAGNOSIS — Z124 Encounter for screening for malignant neoplasm of cervix: Secondary | ICD-10-CM | POA: Insufficient documentation

## 2018-02-09 DIAGNOSIS — Z1239 Encounter for other screening for malignant neoplasm of breast: Secondary | ICD-10-CM | POA: Insufficient documentation

## 2018-03-16 ENCOUNTER — Other Ambulatory Visit: Payer: Self-pay

## 2018-03-16 DIAGNOSIS — E782 Mixed hyperlipidemia: Secondary | ICD-10-CM

## 2018-03-16 MED ORDER — FENOFIBRATE 54 MG PO TABS: 54.0000 mg | ORAL_TABLET | Freq: Every day | ORAL | 5 refills | Status: DC

## 2018-04-22 ENCOUNTER — Telehealth: Payer: Self-pay

## 2018-04-22 DIAGNOSIS — F411 Generalized anxiety disorder: Secondary | ICD-10-CM

## 2018-04-22 MED ORDER — ESCITALOPRAM OXALATE 20 MG PO TABS: 20.0000 mg | ORAL_TABLET | Freq: Every day | ORAL | 5 refills | Status: DC

## 2018-04-22 NOTE — Telephone Encounter (Signed)
lmom that we can fill her clonazepam on her visit

## 2018-04-27 ENCOUNTER — Other Ambulatory Visit: Payer: Self-pay | Admitting: Nurse Practitioner

## 2018-04-29 ENCOUNTER — Other Ambulatory Visit: Payer: Self-pay | Admitting: Nurse Practitioner

## 2018-04-29 DIAGNOSIS — F411 Generalized anxiety disorder: Secondary | ICD-10-CM

## 2018-04-29 MED ORDER — CLONAZEPAM 0.5 MG PO TABS: 0.5000 mg | ORAL_TABLET | Freq: Two times a day (BID) | ORAL | 0 refills | Status: DC | PRN

## 2018-04-29 NOTE — Progress Notes (Signed)
Refilled #15 clonazepam 0.5mg  twice daily as needed and sent to [facility]

## 2018-05-11 ENCOUNTER — Encounter: Payer: Self-pay | Admitting: Nurse Practitioner

## 2018-05-11 ENCOUNTER — Ambulatory Visit (INDEPENDENT_AMBULATORY_CARE_PROVIDER_SITE_OTHER): Payer: Managed Care, Other (non HMO) | Admitting: Nurse Practitioner

## 2018-05-11 VITALS — BP 125/80 | HR 68 | Resp 16 | Ht 62.5 in | Wt 177.0 lb

## 2018-05-11 DIAGNOSIS — Z0001 Encounter for general adult medical examination with abnormal findings: Secondary | ICD-10-CM | POA: Diagnosis not present

## 2018-05-11 DIAGNOSIS — Z124 Encounter for screening for malignant neoplasm of cervix: Secondary | ICD-10-CM

## 2018-05-11 DIAGNOSIS — M545 Low back pain, unspecified: Secondary | ICD-10-CM

## 2018-05-11 DIAGNOSIS — R41840 Attention and concentration deficit: Secondary | ICD-10-CM

## 2018-05-11 DIAGNOSIS — E782 Mixed hyperlipidemia: Secondary | ICD-10-CM | POA: Diagnosis not present

## 2018-05-11 DIAGNOSIS — F411 Generalized anxiety disorder: Secondary | ICD-10-CM

## 2018-05-11 DIAGNOSIS — Z79899 Other long term (current) drug therapy: Secondary | ICD-10-CM | POA: Diagnosis not present

## 2018-05-11 LAB — POCT URINE DRUG SCREEN
POC AMPHETAMINE UR: NOT DETECTED
POC BARBITURATE UR: NOT DETECTED
POC BENZODIAZEPINES UR: NOT DETECTED
POC Cocaine UR: NOT DETECTED
POC Ecstasy UR: NOT DETECTED
POC METHAMPHETAMINE UR: NOT DETECTED
POC Marijuana UR: POSITIVE — AB
POC Methadone UR: NOT DETECTED
POC Opiate Ur: NOT DETECTED
POC Oxycodone UR: NOT DETECTED
POC PHENCYCLIDINE UR: NOT DETECTED
POC TRICYCLICS UR: NOT DETECTED

## 2018-05-11 MED ORDER — ESCITALOPRAM OXALATE 20 MG PO TABS: 20.0000 mg | ORAL_TABLET | Freq: Every day | ORAL | 5 refills | Status: DC

## 2018-05-11 MED ORDER — BUSPIRONE HCL 5 MG PO TABS: ORAL_TABLET | ORAL | 2 refills | Status: DC

## 2018-05-11 MED ORDER — AMPHETAMINE-DEXTROAMPHETAMINE 5 MG PO TABS: 5.0000 mg | ORAL_TABLET | Freq: Two times a day (BID) | ORAL | 0 refills | Status: DC

## 2018-05-11 MED ORDER — CLONAZEPAM 0.5 MG PO TABS: 0.5000 mg | ORAL_TABLET | Freq: Two times a day (BID) | ORAL | 2 refills | Status: DC | PRN

## 2018-05-11 MED ORDER — ETODOLAC 200 MG PO CAPS: 200.0000 mg | ORAL_CAPSULE | Freq: Three times a day (TID) | ORAL | 2 refills | Status: DC

## 2018-05-11 NOTE — Progress Notes (Signed)
[facility] [place], [place] [address]  Internal MEDICINE  Office Visit Note  Patient Name: [name]  [id number]  [id number]  Date of Service: 05/13/2018  Chief Complaint  Patient presents with  . Annual Exam  . Anxiety  . Hyperlipidemia     Patient arrives to the clinic for her annual wellness check up. She feels more anxious and depressed around the holiday season. She takes her medication for anxiety and depression but wondering if she can use something additional to help her through the holiday season. Patient also reports some stress incontinence with sneezing or coughing. She was advised to perform advised to some Kegel exercises each day. Patient does not want any pharmacological intervention at this point. Patient denies any chest pain, shortness of breath, fever, unexpected weight loss. She does not have additional questions or concerns.the patient had mammogram ordered at her last visit. She still needs to schedule this appointment.   Pt is here for routine health maintenance examination  Current Medication: Outpatient Encounter Medications as of 05/11/2018  Medication Sig  . amphetamine-dextroamphetamine (ADDERALL) 5 MG tablet Take 1 tablet (5 mg total) by mouth 2 (two) times daily with a meal.  . atorvastatin (LIPITOR) 40 MG tablet Take 1 tablet (40 mg total) by mouth daily at 6 PM.  . clonazePAM (KLONOPIN) 0.5 MG tablet Take 1 tablet (0.5 mg total) by mouth 2 (two) times daily as needed for anxiety.  Marland Kitchen escitalopram (LEXAPRO) 20 MG tablet Take 1 tablet (20 mg total) by mouth daily.  Marland Kitchen etodolac (LODINE) 200 MG capsule Take 1 capsule (200 mg total) by mouth every 8 (eight) hours.  . fenofibrate 54 MG tablet Take 1 tablet (54 mg total) by mouth daily.  . fluticasone (FLONASE) 50 MCG/ACT nasal spray fluticasone 50 mcg/actuation nasal spray,suspension  . methocarbamol (ROBAXIN) 500 MG tablet Take 500 mg by mouth 4 (four) times daily. Take one  tablet twice daily  . [DISCONTINUED] amphetamine-dextroamphetamine (ADDERALL) 5 MG tablet Take 1 tablet (5 mg total) by mouth 2 (two) times daily with a meal.  . [DISCONTINUED] amphetamine-dextroamphetamine (ADDERALL) 5 MG tablet Take 1 tablet (5 mg total) by mouth 2 (two) times daily with a meal.  . [DISCONTINUED] clonazePAM (KLONOPIN) 0.5 MG tablet Take 1 tablet (0.5 mg total) by mouth 2 (two) times daily as needed for anxiety.  . [DISCONTINUED] escitalopram (LEXAPRO) 20 MG tablet Take 1 tablet (20 mg total) by mouth daily.  . [DISCONTINUED] etodolac (LODINE) 200 MG capsule Take 200 mg by mouth every 8 (eight) hours.  . busPIRone (BUSPAR) 5 MG tablet Take 1 tablet po BID prn anxiety  . [DISCONTINUED] amoxicillin-clavulanate (AUGMENTIN) 875-125 MG tablet Take 1 tablet by mouth 2 (two) times daily. (Patient not taking: Reported on 10/20/2017)  . [DISCONTINUED] cyclobenzaprine (FLEXERIL) 10 MG tablet cyclobenzaprine 10 mg tablet  1 POQ 8 hrs prn muscle tightness  . [DISCONTINUED] pantoprazole (PROTONIX) 40 MG tablet Take 1 tablet (40 mg total) by mouth daily. (Patient not taking: Reported on 10/20/2017)  . [DISCONTINUED] traMADol (ULTRAM) 50 MG tablet Take 1 tablet (50 mg total) by mouth every 6 (six) hours as needed for moderate pain. (Patient not taking: Reported on 10/20/2017)   No facility-administered encounter medications on file as of 05/11/2018.     Surgical History: Past Surgical History:  Procedure Laterality Date  . TOE SURGERY  2005,2011    Medical History: Past Medical History:  Diagnosis Date  . Allergy   . Anxiety   .  GERD (gastroesophageal reflux disease)   . Heart burn   . Hyperlipidemia     Family History: Family History  Problem Relation Age of Onset  . Lung cancer Mother   . Heart disease Father   . Pancreatic cancer Sister       Review of Systems  Constitutional: Negative for activity change, appetite change, chills, fatigue and unexpected weight change.   HENT: Positive for postnasal drip and sinus pressure. Negative for ear discharge, ear pain, facial swelling, hearing loss, mouth sores, nosebleeds, rhinorrhea, sinus pain, sneezing, sore throat, trouble swallowing and voice change.   Eyes: Positive for itching. Negative for photophobia, pain, discharge, redness and visual disturbance.  Respiratory: Negative for apnea, cough, chest tightness, shortness of breath, wheezing and stridor.   Cardiovascular: Positive for leg swelling. Negative for chest pain and palpitations.       Mild swelling in LE calves  Gastrointestinal: Negative for abdominal distention, abdominal pain, blood in stool, constipation, diarrhea, nausea and vomiting.  Endocrine: Negative for cold intolerance, heat intolerance, polydipsia, polyphagia and polyuria.  Genitourinary: Positive for frequency and urgency. Negative for decreased urine volume, difficulty urinating, dysuria, flank pain, hematuria, pelvic pain, vaginal bleeding, vaginal discharge and vaginal pain.  Musculoskeletal: Positive for back pain and joint swelling.       Chronic back pain  Skin: Negative for color change, pallor, rash and wound.  Allergic/Immunologic: Positive for environmental allergies.  Neurological: Negative for dizziness, tremors, seizures, syncope, facial asymmetry, speech difficulty, weakness, light-headedness, numbness and headaches.  Hematological: Negative for adenopathy. Does not bruise/bleed easily.  Psychiatric/Behavioral: Negative for agitation, behavioral problems, confusion, decreased concentration, dysphoric mood, self-injury and suicidal ideas. The patient is nervous/anxious. The patient is not hyperactive.      Today's Vitals   05/11/18 1123  BP: 125/80  Pulse: 68  Resp: 16  SpO2: 97%  Weight: 177 lb (80.3 kg)  Height: 5' 2.5" (1.588 m)    Physical Exam  Constitutional: She is oriented to person, place, and time. She appears well-developed and well-nourished. No distress.   HENT:  Head: Normocephalic and atraumatic.  Right Ear: External ear normal.  Left Ear: External ear normal.  Nose: Nose normal.  Mouth/Throat: Oropharynx is clear and moist. No oropharyngeal exudate.  Right ear fluid  Eyes: Pupils are equal, round, and reactive to light. Conjunctivae and EOM are normal. Right eye exhibits no discharge. Left eye exhibits no discharge. No scleral icterus.  Neck: Normal range of motion. Neck supple. Carotid bruit is not present. No tracheal deviation present. No thyromegaly present.  Cardiovascular: Normal rate, regular rhythm, normal heart sounds and intact distal pulses. Exam reveals no gallop and no friction rub.  No murmur heard. Pulmonary/Chest: Effort normal and breath sounds normal. No respiratory distress. She has no wheezes. She has no rales. She exhibits no mass, no tenderness, no laceration, no crepitus, no edema, no deformity, no swelling and no retraction. Right breast exhibits no inverted nipple, no mass, no nipple discharge, no skin change and no tenderness. Left breast exhibits no inverted nipple, no mass, no nipple discharge, no skin change and no tenderness. No breast swelling, tenderness, discharge or bleeding. Breasts are symmetrical.  Abdominal: Soft. Bowel sounds are normal. She exhibits no distension. There is no tenderness. There is no guarding.  Genitourinary: Vagina normal and uterus normal. No breast swelling, tenderness, discharge or bleeding. No vaginal discharge found.  Genitourinary Comments: Mild tenderness with insertion of speculum and during bimanual exam.   Musculoskeletal: Normal range of motion.  She exhibits edema and tenderness.  Knee joints  Lymphadenopathy:    She has no cervical adenopathy.  Neurological: She is alert and oriented to person, place, and time.  Skin: Skin is warm and dry. Capillary refill takes less than 2 seconds. No rash noted. She is not diaphoretic. No erythema. No pallor.  Psychiatric: She has a normal  mood and affect. Her behavior is normal. Judgment and thought content normal.  Nursing note and vitals reviewed.    LABS: Recent Results (from the past 2160 hour(s))  UA/M w/rflx Culture, Routine     Status: Abnormal (Preliminary result)   Collection Time: 05/11/18 11:24 AM  Result Value Ref Range   Specific Gravity, UA 1.012 1.005 - 1.030   pH, UA 5.5 5.0 - 7.5   Color, UA Yellow Yellow   Appearance Ur Clear Clear   Leukocytes, UA Trace (A) Negative   Protein, UA Negative Negative/Trace   Glucose, UA Negative Negative   Ketones, UA Negative Negative   RBC, UA Negative Negative   Bilirubin, UA Negative Negative   Urobilinogen, Ur 0.2 0.2 - 1.0 mg/dL   Nitrite, UA Negative Negative   Microscopic Examination See below:     Comment: Microscopic was indicated and was performed.   Urinalysis Reflex Comment     Comment: This specimen has reflexed to a Urine Culture.  Microscopic Examination     Status: None   Collection Time: 05/11/18 11:24 AM  Result Value Ref Range   WBC, UA 0-5 0 - 5 /hpf   RBC, UA None seen 0 - 2 /hpf   Epithelial Cells (non renal) 0-10 0 - 10 /hpf   Casts None seen None seen /lpf   Mucus, UA Present Not Estab.   Bacteria, UA Few None seen/Few  Urine Culture, Reflex     Status: Abnormal (Preliminary result)   Collection Time: 05/11/18 11:24 AM  Result Value Ref Range   Urine Culture, Routine Preliminary report (A)    Organism ID, Bacteria Gram negative rods (A)     Comment: 50,000-100,000 colony forming units per mL  POCT Urine Drug Screen     Status: Abnormal   Collection Time: 05/11/18 12:15 PM  Result Value Ref Range   POC METHAMPHETAMINE UR None Detected None Detected    Comment: Repeat again on next visit    POC Opiate Ur None Detected None Detected   POC Barbiturate UR None Detected None Detected   POC Amphetamine UR None Detected None Detected   POC Oxycodone UR None Detected None Detected   POC Cocaine UR None Detected None Detected   POC  Ecstasy UR None Detected None Detected   POC TRICYCLICS UR None Detected None Detected   POC PHENCYCLIDINE UR None Detected None Detected   POC MARIJUANA UR Positive (A) None Detected   POC METHADONE UR None Detected None Detected   POC BENZODIAZEPINES UR None Detected None Detected   URINE TEMPERATURE     POC DRUG SCREEN OXIDANTS URINE     POC SPECIFIC GRAVITY URINE     POC PH URINE     Methylenedioxyamphetamine      Assessment/Plan: 1. Encounter for general adult medical examination with abnormal findings Annual health maintenance exam today with pap smear.  - UA/M w/rflx Culture, Routine  2. Mixed hyperlipidemia Most recent fasting lipid panel stable. Continue simvastatin and fenofibrate as prescribed. Recheck 08/2018 and adjust medications as indicated.  3. Low back pain at multiple sites Patient may use lodine 200mg  up  to three times daily as needed to reduce pain and inflammation.  - etodolac (LODINE) 200 MG capsule; Take 1 capsule (200 mg total) by mouth every 8 (eight) hours.  Dispense: 90 capsule; Refill: 2  4. GAD (generalized anxiety disorder) Worsening. Add buspirone 5mg  up to twice daily as needed for acute anxiety. Continue lexapro 20mg  daily and may use clonazepam 0.5mg  twice daily if needed for acute anxiety.  - clonazePAM (KLONOPIN) 0.5 MG tablet; Take 1 tablet (0.5 mg total) by mouth 2 (two) times daily as needed for anxiety.  Dispense: 45 tablet; Refill: 2 - escitalopram (LEXAPRO) 20 MG tablet; Take 1 tablet (20 mg total) by mouth daily.  Dispense: 30 tablet; Refill: 5 - busPIRone (BUSPAR) 5 MG tablet; Take 1 tablet po BID prn anxiety  Dispense: 60 tablet; Refill: 2  5. Attention and concentration deficit May continue use of adderall 5mg  tablets up to twice daily as needed for focus and concentration. Three 30 day prescriptions were sent to her pharmacy. Dates are 05/11/2018, 06/09/2018, and 07/08/2018 - amphetamine-dextroamphetamine (ADDERALL) 5 MG tablet; Take 1 tablet  (5 mg total) by mouth 2 (two) times daily with a meal.  Dispense: 60 tablet; Refill: 0  6. Encounter for long-term (current) use of medications - POCT Urine Drug Screen positive only for THC. Discuss about results with patient. States that she did go to concert this past weekend and did smoke weed. I explained the risks of smoking marijuana and taking clonazepam simultaneously. She states that smoking marijuana was truly a social thing only and she would not continue. Will repeat her drug screen at next visit. If it continues ot be positive for [facility] will no longer prescribe clonazepam. She voiced understanding.   7. Routine cervical smear - Pap IG and HPV (high risk) DNA detection   General Counseling: Danaja verbalizes understanding of the findings of todays visit and agrees with plan of treatment. I have discussed any further diagnostic evaluation that may be needed or ordered today. We also reviewed her medications today. she has been encouraged to call the office with any questions or concerns that should arise related to todays visit.    Counseling:  Reviewed risks and possible side effects associated with taking opiates, benzodiazepines and other CNS depressants. Combination of these could cause dizziness and drowsiness. Advised patient not to drive or operate machinery when taking these medications, as patient's and other's life can be at risk and will have consequences. Patient verbalized understanding in this matter. Dependence and abuse for these drugs will be monitored closely. A Controlled substance policy and procedure is on file which allows [facility] to order a urine drug screen test at any visit. Patient understands and agrees with the plan  This patient was seen by Leretha Pol FNP Collaboration with Dr Lavera Guise as a part of collaborative care agreement  Orders Placed This Encounter  Procedures  . Microscopic Examination  . Urine Culture, Reflex  . UA/M  w/rflx Culture, Routine  . POCT Urine Drug Screen    Meds ordered this encounter  Medications  . DISCONTD: amphetamine-dextroamphetamine (ADDERALL) 5 MG tablet    Sig: Take 1 tablet (5 mg total) by mouth 2 (two) times daily with a meal.    Dispense:  60 tablet    Refill:  0    Order Specific Question:   Supervising Provider    Answer:   Lavera Guise [place]  . clonazePAM (KLONOPIN) 0.5 MG tablet    Sig: Take  1 tablet (0.5 mg total) by mouth 2 (two) times daily as needed for anxiety.    Dispense:  45 tablet    Refill:  2    Order Specific Question:   Supervising Provider    Answer:   Lavera Guise [[id number]]  . escitalopram (LEXAPRO) 20 MG tablet    Sig: Take 1 tablet (20 mg total) by mouth daily.    Dispense:  30 tablet    Refill:  5    Order Specific Question:   Supervising Provider    Answer:   Lavera Guise [[id number]]  . busPIRone (BUSPAR) 5 MG tablet    Sig: Take 1 tablet po BID prn anxiety    Dispense:  60 tablet    Refill:  2    Order Specific Question:   Supervising Provider    Answer:   Lavera Guise [[id number]]  . amphetamine-dextroamphetamine (ADDERALL) 5 MG tablet    Sig: Take 1 tablet (5 mg total) by mouth 2 (two) times daily with a meal.    Dispense:  60 tablet    Refill:  0    Fill after 07/04/2018    Order Specific Question:   Supervising Provider    Answer:   Lavera Guise [place]  . etodolac (LODINE) 200 MG capsule    Sig: Take 1 capsule (200 mg total) by mouth every 8 (eight) hours.    Dispense:  90 capsule    Refill:  2    Order Specific Question:   Supervising Provider    Answer:   Lavera Guise [[id number]]    Time spent: [place], MD  Internal Medicine

## 2018-05-13 ENCOUNTER — Other Ambulatory Visit: Payer: Self-pay | Admitting: Nurse Practitioner

## 2018-05-13 DIAGNOSIS — N390 Urinary tract infection, site not specified: Secondary | ICD-10-CM

## 2018-05-13 DIAGNOSIS — R319 Hematuria, unspecified: Principal | ICD-10-CM

## 2018-05-13 MED ORDER — SULFAMETHOXAZOLE-TRIMETHOPRIM 800-160 MG PO TABS: 1.0000 | ORAL_TABLET | Freq: Two times a day (BID) | ORAL | 0 refills | Status: DC

## 2018-05-13 NOTE — Progress Notes (Signed)
Urine sample from physical positive for infection. Started bactrim DS bid for 7 days. New prescription sent to her pharmacy.

## 2018-05-14 LAB — MICROSCOPIC EXAMINATION
Casts: NONE SEEN /lpf
RBC, UA: NONE SEEN /hpf (ref 0–2)

## 2018-05-14 LAB — UA/M W/RFLX CULTURE, ROUTINE
Bilirubin, UA: NEGATIVE
Glucose, UA: NEGATIVE
Ketones, UA: NEGATIVE
Nitrite, UA: NEGATIVE
PH UA: 5.5 (ref 5.0–7.5)
PROTEIN UA: NEGATIVE
RBC, UA: NEGATIVE
Specific Gravity, UA: 1.012 (ref 1.005–1.030)
UUROB: 0.2 mg/dL (ref 0.2–1.0)

## 2018-05-14 LAB — URINE CULTURE, REFLEX

## 2018-05-14 LAB — PAP IG AND HPV HIGH-RISK: HPV, HIGH-RISK: NEGATIVE

## 2018-07-24 ENCOUNTER — Ambulatory Visit: Payer: Self-pay | Admitting: Nurse Practitioner

## 2018-09-22 ENCOUNTER — Other Ambulatory Visit: Payer: Self-pay | Admitting: Nurse Practitioner

## 2018-09-22 DIAGNOSIS — F411 Generalized anxiety disorder: Secondary | ICD-10-CM

## 2018-09-22 MED ORDER — ESCITALOPRAM OXALATE 20 MG PO TABS: 20.0000 mg | ORAL_TABLET | Freq: Every day | ORAL | 0 refills | Status: DC

## 2018-09-22 MED ORDER — FLUTICASONE PROPIONATE 50 MCG/ACT NA SUSP: 2.0000 | Freq: Every day | NASAL | 3 refills | Status: DC

## 2018-09-22 MED ORDER — BUSPIRONE HCL 5 MG PO TABS: ORAL_TABLET | ORAL | 0 refills | Status: DC

## 2018-11-18 ENCOUNTER — Other Ambulatory Visit: Payer: Self-pay

## 2018-11-18 DIAGNOSIS — E782 Mixed hyperlipidemia: Secondary | ICD-10-CM

## 2018-11-18 MED ORDER — ATORVASTATIN CALCIUM 40 MG PO TABS: 40.0000 mg | ORAL_TABLET | Freq: Every day | ORAL | 0 refills | Status: DC

## 2018-11-24 ENCOUNTER — Other Ambulatory Visit: Payer: Self-pay

## 2018-11-24 DIAGNOSIS — F411 Generalized anxiety disorder: Secondary | ICD-10-CM

## 2018-11-24 MED ORDER — ESCITALOPRAM OXALATE 20 MG PO TABS: 20.0000 mg | ORAL_TABLET | Freq: Every day | ORAL | 0 refills | Status: DC

## 2018-11-24 NOTE — Telephone Encounter (Signed)
Spoke with pt need appt for further refills

## 2019-01-01 ENCOUNTER — Other Ambulatory Visit: Payer: Self-pay

## 2019-01-01 ENCOUNTER — Encounter: Payer: Self-pay | Admitting: Nurse Practitioner

## 2019-01-01 ENCOUNTER — Ambulatory Visit: Payer: Managed Care, Other (non HMO) | Admitting: Nurse Practitioner

## 2019-01-01 DIAGNOSIS — F411 Generalized anxiety disorder: Secondary | ICD-10-CM | POA: Diagnosis not present

## 2019-01-01 DIAGNOSIS — M545 Low back pain, unspecified: Secondary | ICD-10-CM

## 2019-01-01 DIAGNOSIS — R41840 Attention and concentration deficit: Secondary | ICD-10-CM | POA: Diagnosis not present

## 2019-01-01 DIAGNOSIS — E782 Mixed hyperlipidemia: Secondary | ICD-10-CM | POA: Diagnosis not present

## 2019-01-01 MED ORDER — BUSPIRONE HCL 5 MG PO TABS: ORAL_TABLET | ORAL | 1 refills | Status: DC

## 2019-01-01 MED ORDER — AMPHETAMINE-DEXTROAMPHETAMINE 5 MG PO TABS: 5.0000 mg | ORAL_TABLET | Freq: Two times a day (BID) | ORAL | 0 refills | Status: DC

## 2019-01-01 MED ORDER — ESCITALOPRAM OXALATE 20 MG PO TABS: 20.0000 mg | ORAL_TABLET | Freq: Every day | ORAL | 3 refills | Status: DC

## 2019-01-01 MED ORDER — ATORVASTATIN CALCIUM 40 MG PO TABS: 40.0000 mg | ORAL_TABLET | Freq: Every day | ORAL | 3 refills | Status: DC

## 2019-01-01 MED ORDER — CLONAZEPAM 0.5 MG PO TABS: 0.5000 mg | ORAL_TABLET | Freq: Two times a day (BID) | ORAL | 3 refills | Status: DC | PRN

## 2019-01-01 MED ORDER — ETODOLAC 200 MG PO CAPS: 200.0000 mg | ORAL_CAPSULE | Freq: Two times a day (BID) | ORAL | 2 refills | Status: DC

## 2019-01-01 MED ORDER — FENOFIBRATE 54 MG PO TABS: 54.0000 mg | ORAL_TABLET | Freq: Every day | ORAL | 3 refills | Status: DC

## 2019-01-01 NOTE — Progress Notes (Signed)
[facility] [place], [place] [address]  Internal MEDICINE  Telephone Visit  Patient Name: [name]  [id number]  [id number]  Date of Service: 01/13/2019  I connected with the patient at 1:46pm by telephone and verified the patients identity using two identifiers.   I discussed the limitations, risks, security and privacy concerns of performing an evaluation and management service by telephone and the availability of in person appointments. I also discussed with the patient that there may be a patient responsible charge related to the service.  The patient expressed understanding and agrees to proceed.    Chief Complaint  Patient presents with  . Telephone Assessment  . Telephone Screen  . Medication Refill    adderall and clonozepam    The patient has been contacted via telephone for follow up visit due to concerns for spread of novel coronavirus. The patient states that she is doing very well. She has started working out with a Clinical research associate. Has been doing this for about 5 weeks. She continues to have anxiety and depression. Does well managing this with her current medications. She also takes adderall 5mg  twice daily while working. She has been taking this ore often, recently, as work demands have increased as have the hours and working days. She is able to take this, stay on track and focused. She has no negative side effects from taking this. She does need to have refills for her routine medications today.       Current Medication: Outpatient Encounter Medications as of 01/01/2019  Medication Sig  . amphetamine-dextroamphetamine (ADDERALL) 5 MG tablet Take 1 tablet (5 mg total) by mouth 2 (two) times daily with a meal.  . atorvastatin (LIPITOR) 40 MG tablet Take 1 tablet (40 mg total) by mouth daily at 6 PM.  . busPIRone (BUSPAR) 5 MG tablet Take 1 tablet po BID prn anxiety  . clonazePAM (KLONOPIN) 0.5 MG tablet Take 1 tablet (0.5 mg total) by mouth 2  (two) times daily as needed for anxiety.  Marland Kitchen escitalopram (LEXAPRO) 20 MG tablet Take 1 tablet (20 mg total) by mouth daily.  Marland Kitchen etodolac (LODINE) 200 MG capsule Take 1 capsule (200 mg total) by mouth 2 (two) times daily.  . fenofibrate 54 MG tablet Take 1 tablet (54 mg total) by mouth daily.  . fluticasone (FLONASE) 50 MCG/ACT nasal spray Place 2 sprays into both nostrils daily.  . methocarbamol (ROBAXIN) 500 MG tablet Take 500 mg by mouth 4 (four) times daily. Take one tablet twice daily  . [DISCONTINUED] amphetamine-dextroamphetamine (ADDERALL) 5 MG tablet Take 1 tablet (5 mg total) by mouth 2 (two) times daily with a meal.  . [DISCONTINUED] amphetamine-dextroamphetamine (ADDERALL) 5 MG tablet Take 1 tablet (5 mg total) by mouth 2 (two) times daily with a meal.  . [DISCONTINUED] amphetamine-dextroamphetamine (ADDERALL) 5 MG tablet Take 1 tablet (5 mg total) by mouth 2 (two) times daily with a meal.  . [DISCONTINUED] atorvastatin (LIPITOR) 40 MG tablet Take 1 tablet (40 mg total) by mouth daily at 6 PM.  . [DISCONTINUED] busPIRone (BUSPAR) 5 MG tablet Take 1 tablet po BID prn anxiety  . [DISCONTINUED] clonazePAM (KLONOPIN) 0.5 MG tablet Take 1 tablet (0.5 mg total) by mouth 2 (two) times daily as needed for anxiety.  . [DISCONTINUED] escitalopram (LEXAPRO) 20 MG tablet Take 1 tablet (20 mg total) by mouth daily.  . [DISCONTINUED] etodolac (LODINE) 200 MG capsule Take 1 capsule (200 mg total) by mouth every 8 (eight) hours.  . [  DISCONTINUED] fenofibrate 54 MG tablet Take 1 tablet (54 mg total) by mouth daily.  Marland Kitchen sulfamethoxazole-trimethoprim (BACTRIM DS,SEPTRA DS) 800-160 MG tablet Take 1 tablet by mouth 2 (two) times daily. (Patient not taking: Reported on 01/01/2019)   No facility-administered encounter medications on file as of 01/01/2019.     Surgical History: Past Surgical History:  Procedure Laterality Date  . TOE SURGERY  2005,2011    Medical History: Past Medical History:  Diagnosis  Date  . Allergy   . Anxiety   . GERD (gastroesophageal reflux disease)   . Heart burn   . Hyperlipidemia     Family History: Family History  Problem Relation Age of Onset  . Lung cancer Mother   . Heart disease Father   . Pancreatic cancer Sister     Social History   Socioeconomic History  . Marital status: Married    Spouse name: Not on file  . Number of children: Not on file  . Years of education: Not on file  . Highest education level: Not on file  Occupational History  . Not on file  Social Needs  . Financial resource strain: Not on file  . Food insecurity    Worry: Not on file    Inability: Not on file  . Transportation needs    Medical: Not on file    Non-medical: Not on file  Tobacco Use  . Smoking status: Never Smoker  . Smokeless tobacco: Never Used  Substance and Sexual Activity  . Alcohol use: No    Frequency: Never  . Drug use: No  . Sexual activity: Not on file  Lifestyle  . Physical activity    Days per week: Not on file    Minutes per session: Not on file  . Stress: Not on file  Relationships  . Social Herbalist on phone: Not on file    Gets together: Not on file    Attends religious service: Not on file    Active member of club or organization: Not on file    Attends meetings of clubs or organizations: Not on file    Relationship status: Not on file  . Intimate partner violence    Fear of current or ex partner: Not on file    Emotionally abused: Not on file    Physically abused: Not on file    Forced sexual activity: Not on file  Other Topics Concern  . Not on file  Social History Narrative  . Not on file      Review of Systems  Constitutional: Positive for fatigue. Negative for activity change, chills and unexpected weight change.  HENT: Negative for congestion, postnasal drip, rhinorrhea, sneezing and sore throat.   Respiratory: Negative for cough, chest tightness, shortness of breath and wheezing.   Cardiovascular:  Negative for chest pain and palpitations.  Gastrointestinal: Negative for abdominal pain, constipation, diarrhea, nausea and vomiting.  Endocrine: Negative for cold intolerance, heat intolerance, polydipsia and polyuria.  Musculoskeletal: Positive for back pain. Negative for arthralgias, joint swelling and neck pain.       Chronic low back pain with muscle aches.   Skin: Negative for rash.  Allergic/Immunologic: Negative for environmental allergies.  Neurological: Negative for dizziness, tremors, numbness and headaches.  Hematological: Negative for adenopathy. Does not bruise/bleed easily.  Psychiatric/Behavioral: Positive for behavioral problems (Depression) and decreased concentration. Negative for sleep disturbance and suicidal ideas. The patient is nervous/anxious.     Today's Vitals   01/01/19 1339  Weight: 175 lb (79.4 kg)  Height: 5\' 2"  (1.575 m)   Body mass index is 32.01 kg/m.  Observation/Objective:   The patient is alert and oriented. She is pleasant and answers all questions appropriately. Breathing is non-labored. She is in no acute distress at this time.    Assessment/Plan:  1. Mixed hyperlipidemia Check fasting labs. Adjust dosing of lipitor and fenofibrate as indicated.  - atorvastatin (LIPITOR) 40 MG tablet; Take 1 tablet (40 mg total) by mouth daily at 6 PM.  Dispense: 90 tablet; Refill: 3 - fenofibrate 54 MG tablet; Take 1 tablet (54 mg total) by mouth daily.  Dispense: 90 tablet; Refill: 3  2. Low back pain at multiple sites May continue totake etodolac 200mg  twice daily if needed for pain/inflammation.  - etodolac (LODINE) 200 MG capsule; Take 1 capsule (200 mg total) by mouth 2 (two) times daily.  Dispense: 60 capsule; Refill: 2  3. GAD (generalized anxiety disorder) Well managed. Continue lexapro 20mg  daily. May take buspirone 5mg  twice daily as needed for acute anxiety. Clonazepam 0.5mg  may be taken twice daily as needed for acute anxiety if not working or  going to bed.  - clonazePAM (KLONOPIN) 0.5 MG tablet; Take 1 tablet (0.5 mg total) by mouth 2 (two) times daily as needed for anxiety.  Dispense: 45 tablet; Refill: 3 - busPIRone (BUSPAR) 5 MG tablet; Take 1 tablet po BID prn anxiety  Dispense: 180 tablet; Refill: 1 - escitalopram (LEXAPRO) 20 MG tablet; Take 1 tablet (20 mg total) by mouth daily.  Dispense: 90 tablet; Refill: 3  4. Attention and concentration deficit May continue to take adderall 5mg  tablets twice daily if needed for focus and concentration. Single, thirty day prescription sent to her pharmacy . - amphetamine-dextroamphetamine (ADDERALL) 5 MG tablet; Take 1 tablet (5 mg total) by mouth 2 (two) times daily with a meal.  Dispense: 60 tablet; Refill: 0  General Counseling: Kamdyn verbalizes understanding of the findings of today's phone visit and agrees with plan of treatment. I have discussed any further diagnostic evaluation that may be needed or ordered today. We also reviewed her medications today. she has been encouraged to call the office with any questions or concerns that should arise related to todays visit.   This patient was seen by [place] with Dr Lavera Guise as a part of collaborative care agreement  Meds ordered this encounter  Medications  . DISCONTD: amphetamine-dextroamphetamine (ADDERALL) 5 MG tablet    Sig: Take 1 tablet (5 mg total) by mouth 2 (two) times daily with a meal.    Dispense:  60 tablet    Refill:  0    Order Specific Question:   Supervising Provider    Answer:   Lavera Guise [place]  . clonazePAM (KLONOPIN) 0.5 MG tablet    Sig: Take 1 tablet (0.5 mg total) by mouth 2 (two) times daily as needed for anxiety.    Dispense:  45 tablet    Refill:  3    Order Specific Question:   Supervising Provider    Answer:   Lavera Guise [[id number]]  . DISCONTD: amphetamine-dextroamphetamine (ADDERALL) 5 MG tablet    Sig: Take 1 tablet (5 mg total) by mouth 2 (two) times daily with a  meal.    Dispense:  60 tablet    Refill:  0    Fill after 01/30/2019    Order Specific Question:   Supervising Provider    Answer:   Clayborn Bigness  M [[id number]]  . amphetamine-dextroamphetamine (ADDERALL) 5 MG tablet    Sig: Take 1 tablet (5 mg total) by mouth 2 (two) times daily with a meal.    Dispense:  60 tablet    Refill:  0    Fill after 02/28/2019    Order Specific Question:   Supervising Provider    Answer:   Lavera Guise [place]  . atorvastatin (LIPITOR) 40 MG tablet    Sig: Take 1 tablet (40 mg total) by mouth daily at 6 PM.    Dispense:  90 tablet    Refill:  3    Pt needs appointment for further refills    Order Specific Question:   Supervising Provider    Answer:   Lavera Guise [place]  . busPIRone (BUSPAR) 5 MG tablet    Sig: Take 1 tablet po BID prn anxiety    Dispense:  180 tablet    Refill:  1    Order Specific Question:   Supervising Provider    Answer:   Lavera Guise [[id number]]  . escitalopram (LEXAPRO) 20 MG tablet    Sig: Take 1 tablet (20 mg total) by mouth daily.    Dispense:  90 tablet    Refill:  3    Pt needs follow up before anymore refills    Order Specific Question:   Supervising Provider    Answer:   Lavera Guise [[id number]]  . fenofibrate 54 MG tablet    Sig: Take 1 tablet (54 mg total) by mouth daily.    Dispense:  90 tablet    Refill:  3    Order Specific Question:   Supervising Provider    Answer:   Lavera Guise [[id number]]  . etodolac (LODINE) 200 MG capsule    Sig: Take 1 capsule (200 mg total) by mouth 2 (two) times daily.    Dispense:  60 capsule    Refill:  2    Order Specific Question:   Supervising Provider    Answer:   Lavera Guise [[id number]]    Time spent: 75 Minutes    Dr Lavera Guise Internal medicine

## 2019-02-25 ENCOUNTER — Ambulatory Visit: Payer: Managed Care, Other (non HMO) | Admitting: Adult Health

## 2019-03-30 ENCOUNTER — Encounter: Payer: Self-pay | Admitting: Nurse Practitioner

## 2019-03-30 ENCOUNTER — Other Ambulatory Visit: Payer: Self-pay

## 2019-03-30 ENCOUNTER — Ambulatory Visit: Payer: Managed Care, Other (non HMO) | Admitting: Nurse Practitioner

## 2019-03-30 VITALS — BP 141/84 | HR 74 | Temp 97.6°F | Resp 16 | Ht 62.0 in | Wt 174.0 lb

## 2019-03-30 DIAGNOSIS — Z79899 Other long term (current) drug therapy: Secondary | ICD-10-CM

## 2019-03-30 DIAGNOSIS — R41840 Attention and concentration deficit: Secondary | ICD-10-CM | POA: Diagnosis not present

## 2019-03-30 DIAGNOSIS — E782 Mixed hyperlipidemia: Secondary | ICD-10-CM

## 2019-03-30 DIAGNOSIS — R300 Dysuria: Secondary | ICD-10-CM | POA: Diagnosis not present

## 2019-03-30 LAB — POCT URINE DRUG SCREEN
POC Amphetamine UR: NOT DETECTED
POC BENZODIAZEPINES UR: NOT DETECTED
POC Barbiturate UR: NOT DETECTED
POC Cocaine UR: NOT DETECTED
POC Ecstasy UR: NOT DETECTED
POC Marijuana UR: POSITIVE — AB
POC Methadone UR: NOT DETECTED
POC Methamphetamine UR: NOT DETECTED
POC Opiate Ur: NOT DETECTED
POC Oxycodone UR: NOT DETECTED
POC PHENCYCLIDINE UR: NOT DETECTED
POC TRICYCLICS UR: NOT DETECTED

## 2019-03-30 LAB — POCT URINALYSIS DIPSTICK
Bilirubin, UA: NEGATIVE
Blood, UA: NEGATIVE
Glucose, UA: NEGATIVE
Ketones, UA: NEGATIVE
Leukocytes, UA: NEGATIVE
Nitrite, UA: NEGATIVE
Protein, UA: NEGATIVE
Spec Grav, UA: 1.01 (ref 1.010–1.025)
Urobilinogen, UA: 0.2 E.U./dL
pH, UA: 6.5 (ref 5.0–8.0)

## 2019-03-30 MED ORDER — AMPHETAMINE-DEXTROAMPHETAMINE 5 MG PO TABS: 5.0000 mg | ORAL_TABLET | Freq: Two times a day (BID) | ORAL | 0 refills | Status: DC

## 2019-03-30 NOTE — Progress Notes (Signed)
[facility] [place], [place] [address]  Internal MEDICINE  Office Visit Note  Patient Name: [name]  [id number]  [id number]  Date of Service: 04/17/2019  Chief Complaint  Patient presents with  . ADD  . Hyperlipidemia  . Gastroesophageal Reflux    The patient is here for routine follow up. The patient states that she is doing very well. She recently got a promotion at work. Is working longer hours and has increased responsibility.  Does well managing this with her current medications. She also takes adderall 5mg  twice daily while working. She has been taking this ore often, recently, as work demands have increased as have the hours and working days. She is able to take this, stay on track and focused. She has no negative side effects from taking this. She does need to have refills for her routine medications today. She admits that her UDS will likely be positive for THC. She recently participated in social use of marijuana.        Current Medication: Outpatient Encounter Medications as of 03/30/2019  Medication Sig  . amphetamine-dextroamphetamine (ADDERALL) 5 MG tablet Take 1 tablet (5 mg total) by mouth 2 (two) times daily with a meal.  . atorvastatin (LIPITOR) 40 MG tablet Take 1 tablet (40 mg total) by mouth daily at 6 PM.  . busPIRone (BUSPAR) 5 MG tablet Take 1 tablet po BID prn anxiety  . clonazePAM (KLONOPIN) 0.5 MG tablet Take 1 tablet (0.5 mg total) by mouth 2 (two) times daily as needed for anxiety.  Marland Kitchen escitalopram (LEXAPRO) 20 MG tablet Take 1 tablet (20 mg total) by mouth daily.  Marland Kitchen etodolac (LODINE) 200 MG capsule Take 1 capsule (200 mg total) by mouth 2 (two) times daily.  . fenofibrate 54 MG tablet Take 1 tablet (54 mg total) by mouth daily.  . fluticasone (FLONASE) 50 MCG/ACT nasal spray Place 2 sprays into both nostrils daily.  . methocarbamol (ROBAXIN) 500 MG tablet Take 500 mg by mouth 4 (four) times daily. Take one tablet twice  daily  . [DISCONTINUED] amphetamine-dextroamphetamine (ADDERALL) 5 MG tablet Take 1 tablet (5 mg total) by mouth 2 (two) times daily with a meal.  . [DISCONTINUED] amphetamine-dextroamphetamine (ADDERALL) 5 MG tablet Take 1 tablet (5 mg total) by mouth 2 (two) times daily with a meal.  . [DISCONTINUED] amphetamine-dextroamphetamine (ADDERALL) 5 MG tablet Take 1 tablet (5 mg total) by mouth 2 (two) times daily with a meal.  . [DISCONTINUED] sulfamethoxazole-trimethoprim (BACTRIM DS,SEPTRA DS) 800-160 MG tablet Take 1 tablet by mouth 2 (two) times daily. (Patient not taking: Reported on 03/30/2019)   No facility-administered encounter medications on file as of 03/30/2019.     Surgical History: Past Surgical History:  Procedure Laterality Date  . TOE SURGERY  2005,2011    Medical History: Past Medical History:  Diagnosis Date  . Allergy   . Anxiety   . GERD (gastroesophageal reflux disease)   . Heart burn   . Hyperlipidemia     Family History: Family History  Problem Relation Age of Onset  . Lung cancer Mother   . Heart disease Father   . Pancreatic cancer Sister     Social History   Socioeconomic History  . Marital status: Married    Spouse name: Not on file  . Number of children: Not on file  . Years of education: Not on file  . Highest education level: Not on file  Occupational History  . Not on file  Social Needs  . Financial resource strain: Not on file  . Food insecurity    Worry: Not on file    Inability: Not on file  . Transportation needs    Medical: Not on file    Non-medical: Not on file  Tobacco Use  . Smoking status: Never Smoker  . Smokeless tobacco: Never Used  Substance and Sexual Activity  . Alcohol use: Yes    Frequency: Never    Comment: ocassionally   . Drug use: No  . Sexual activity: Not on file  Lifestyle  . Physical activity    Days per week: Not on file    Minutes per session: Not on file  . Stress: Not on file  Relationships  .  Social Herbalist on phone: Not on file    Gets together: Not on file    Attends religious service: Not on file    Active member of club or organization: Not on file    Attends meetings of clubs or organizations: Not on file    Relationship status: Not on file  . Intimate partner violence    Fear of current or ex partner: Not on file    Emotionally abused: Not on file    Physically abused: Not on file    Forced sexual activity: Not on file  Other Topics Concern  . Not on file  Social History Narrative  . Not on file      Review of Systems  Constitutional: Negative for activity change, chills, fatigue and unexpected weight change.  HENT: Negative for congestion, postnasal drip, rhinorrhea, sneezing and sore throat.   Respiratory: Negative for cough, chest tightness, shortness of breath and wheezing.   Cardiovascular: Negative for chest pain and palpitations.  Gastrointestinal: Negative for abdominal pain, constipation, diarrhea, nausea and vomiting.  Endocrine: Negative for cold intolerance, heat intolerance, polydipsia and polyuria.  Musculoskeletal: Positive for back pain. Negative for arthralgias, joint swelling and neck pain.       Chronic low back pain with muscle aches.   Skin: Negative for rash.  Allergic/Immunologic: Negative for environmental allergies.  Neurological: Negative for dizziness, tremors, numbness and headaches.  Hematological: Negative for adenopathy. Does not bruise/bleed easily.  Psychiatric/Behavioral: Positive for behavioral problems (Depression) and decreased concentration. Negative for sleep disturbance and suicidal ideas. The patient is nervous/anxious.        Well managed with current medication.    Today's Vitals   03/30/19 1617  BP: (!) 141/84  Pulse: 74  Resp: 16  Temp: 97.6 F (36.4 C)  SpO2: 97%  Weight: 174 lb (78.9 kg)  Height: 5\' 2"  (1.575 m)   Body mass index is 31.83 kg/m.  Physical Exam Vitals signs and nursing note  reviewed.  Constitutional:      General: She is not in acute distress.    Appearance: Normal appearance. She is well-developed. She is not diaphoretic.  HENT:     Head: Normocephalic and atraumatic.     Nose: Nose normal.     Mouth/Throat:     Pharynx: No oropharyngeal exudate.  Eyes:     Conjunctiva/sclera: Conjunctivae normal.     Pupils: Pupils are equal, round, and reactive to light.  Neck:     Musculoskeletal: Normal range of motion and neck supple.     Thyroid: No thyromegaly.     Vascular: No carotid bruit or JVD.     Trachea: No tracheal deviation.  Cardiovascular:     Rate and Rhythm:  Normal rate and regular rhythm.     Heart sounds: Normal heart sounds. No murmur. No friction rub. No gallop.   Pulmonary:     Effort: Pulmonary effort is normal. No respiratory distress.     Breath sounds: Normal breath sounds. No wheezing or rales.  Chest:     Chest wall: No tenderness.  Abdominal:     Palpations: Abdomen is soft.  Musculoskeletal:     Comments: Moderate lower back pain, more severe with bending and twisting at the waist. Radiates to the hips. No viaible or palpable abnormalities or deformities noted today.  Lymphadenopathy:     Cervical: No cervical adenopathy.  Skin:    General: Skin is warm and dry.  Neurological:     Mental Status: She is alert and oriented to person, place, and time.     Cranial Nerves: No cranial nerve deficit.  Psychiatric:        Mood and Affect: Mood is anxious.        Behavior: Behavior normal.        Thought Content: Thought content normal.        Judgment: Judgment normal.   Assessment/Plan: 1. Mixed hyperlipidemia Continue cholesterol medication as prescribed   2. Attention and concentration deficit May continue to take adderall 5mg  twice daily if needed for focus. Three 30 day prescriptions sent to her pharmacy. Dates are 03/30/2019, 04/28/2019, and 05/26/2019 - amphetamine-dextroamphetamine (ADDERALL) 5 MG tablet; Take 1 tablet  (5 mg total) by mouth 2 (two) times daily with a meal.  Dispense: 60 tablet; Refill: 0  3. Encounter for long-term (current) use of medications - POCT Urine Drug Screen positive for THC. Discussed this with patient and she reports social use of marijuana. This is rare. Advised her I would not be able to prescribe clonazepam as long as UDS is positive for THC. She voices understanding.    4. Dysuria - POCT Urinalysis Dipstick negative for infection or other abnormalities.   General Counseling: Mikeyla verbalizes understanding of the findings of todays visit and agrees with plan of treatment. I have discussed any further diagnostic evaluation that may be needed or ordered today. We also reviewed her medications today. she has been encouraged to call the office with any questions or concerns that should arise related to todays visit.  Refilled Controlled medications today. Reviewed risks and possible side effects associated with taking Stimulants. Combination of these drugs with other psychotropic medications could cause dizziness and drowsiness. Pt needs to Monitor symptoms and exercise caution in driving and operating heavy machinery to avoid damages to oneself, to others and to the surroundings. Patient verbalized understanding in this matter. Dependence and abuse for these drugs will be monitored closely. A Controlled substance policy and procedure is on file which allows [facility] to order a urine drug screen test at any visit. Patient understands and agrees with the plan..  This patient was seen by Leretha Pol FNP Collaboration with Dr Lavera Guise as a part of collaborative care agreement  Orders Placed This Encounter  Procedures  . POCT Urinalysis Dipstick  . POCT Urine Drug Screen    Meds ordered this encounter  Medications  . DISCONTD: amphetamine-dextroamphetamine (ADDERALL) 5 MG tablet    Sig: Take 1 tablet (5 mg total) by mouth 2 (two) times daily with a meal.     Dispense:  60 tablet    Refill:  0    Order Specific Question:   Supervising Provider    Answer:  KHAN, FOZIA M [id number]  . DISCONTD: amphetamine-dextroamphetamine (ADDERALL) 5 MG tablet    Sig: Take 1 tablet (5 mg total) by mouth 2 (two) times daily with a meal.    Dispense:  60 tablet    Refill:  0    Fill after 04/28/2019    Order Specific Question:   Supervising Provider    Answer:   Lavera Guise [place]  . amphetamine-dextroamphetamine (ADDERALL) 5 MG tablet    Sig: Take 1 tablet (5 mg total) by mouth 2 (two) times daily with a meal.    Dispense:  60 tablet    Refill:  0    Fill after 05/26/2019    Order Specific Question:   Supervising Provider    Answer:   Lavera Guise [[id number]]    Time spent: 25 Minutes      Dr Lavera Guise Internal medicine

## 2019-04-17 DIAGNOSIS — Z79899 Other long term (current) drug therapy: Secondary | ICD-10-CM | POA: Insufficient documentation

## 2019-05-11 ENCOUNTER — Telehealth: Payer: Self-pay

## 2019-05-11 NOTE — Telephone Encounter (Signed)
LMOM FOR PATIENT TO CONFIRM 05-13-19 OV.

## 2019-05-13 ENCOUNTER — Encounter: Payer: Managed Care, Other (non HMO) | Admitting: Nurse Practitioner

## 2019-05-14 ENCOUNTER — Other Ambulatory Visit: Payer: Self-pay

## 2019-05-14 ENCOUNTER — Ambulatory Visit (INDEPENDENT_AMBULATORY_CARE_PROVIDER_SITE_OTHER): Payer: Managed Care, Other (non HMO) | Admitting: Nurse Practitioner

## 2019-05-14 ENCOUNTER — Encounter: Payer: Self-pay | Admitting: Nurse Practitioner

## 2019-05-14 VITALS — BP 142/94 | HR 75 | Temp 97.5°F | Resp 16 | Ht 62.0 in | Wt 175.6 lb

## 2019-05-14 DIAGNOSIS — K219 Gastro-esophageal reflux disease without esophagitis: Secondary | ICD-10-CM

## 2019-05-14 DIAGNOSIS — E782 Mixed hyperlipidemia: Secondary | ICD-10-CM

## 2019-05-14 DIAGNOSIS — Z1231 Encounter for screening mammogram for malignant neoplasm of breast: Secondary | ICD-10-CM

## 2019-05-14 DIAGNOSIS — R300 Dysuria: Secondary | ICD-10-CM

## 2019-05-14 DIAGNOSIS — Z0001 Encounter for general adult medical examination with abnormal findings: Secondary | ICD-10-CM | POA: Diagnosis not present

## 2019-05-14 DIAGNOSIS — F411 Generalized anxiety disorder: Secondary | ICD-10-CM | POA: Diagnosis not present

## 2019-05-14 MED ORDER — CLONAZEPAM 0.5 MG PO TABS: 0.5000 mg | ORAL_TABLET | Freq: Two times a day (BID) | ORAL | 2 refills | Status: DC | PRN

## 2019-05-14 NOTE — Progress Notes (Signed)
[facility] [place], [place] [address]  Internal MEDICINE  Office Visit Note  Patient Name: [name]  [id number]  [id number]  Date of Service: 05/16/2019   Pt is here for routine health maintenance examination  Chief Complaint  Patient presents with  . Annual Exam  . Hyperlipidemia  . Gastroesophageal Reflux     The patient is here for health maintenance exam. She does not have concerns or complaints. She is due to have routine, fasting labs. She does have lab slip, just needs to ave labs drawn. She is also due to have screening mammogram. She needs to have a flu shot.     Current Medication: Outpatient Encounter Medications as of 05/14/2019  Medication Sig  . amphetamine-dextroamphetamine (ADDERALL) 5 MG tablet Take 1 tablet (5 mg total) by mouth 2 (two) times daily with a meal.  . atorvastatin (LIPITOR) 40 MG tablet Take 1 tablet (40 mg total) by mouth daily at 6 PM.  . busPIRone (BUSPAR) 5 MG tablet Take 1 tablet po BID prn anxiety  . clonazePAM (KLONOPIN) 0.5 MG tablet Take 1 tablet (0.5 mg total) by mouth 2 (two) times daily as needed for anxiety.  Marland Kitchen escitalopram (LEXAPRO) 20 MG tablet Take 1 tablet (20 mg total) by mouth daily.  Marland Kitchen etodolac (LODINE) 200 MG capsule Take 1 capsule (200 mg total) by mouth 2 (two) times daily.  . fenofibrate 54 MG tablet Take 1 tablet (54 mg total) by mouth daily.  . fluticasone (FLONASE) 50 MCG/ACT nasal spray Place 2 sprays into both nostrils daily.  . methocarbamol (ROBAXIN) 500 MG tablet Take 500 mg by mouth 4 (four) times daily. Take one tablet twice daily  . [DISCONTINUED] clonazePAM (KLONOPIN) 0.5 MG tablet Take 1 tablet (0.5 mg total) by mouth 2 (two) times daily as needed for anxiety.   No facility-administered encounter medications on file as of 05/14/2019.    Surgical History: Past Surgical History:  Procedure Laterality Date  . TOE SURGERY  2005,2011    Medical History: Past Medical  History:  Diagnosis Date  . Allergy   . Anxiety   . GERD (gastroesophageal reflux disease)   . Heart burn   . Hyperlipidemia     Family History: Family History  Problem Relation Age of Onset  . Lung cancer Mother   . Heart disease Father   . Pancreatic cancer Sister       Review of Systems  Constitutional: Negative for activity change, chills, fatigue and unexpected weight change.  HENT: Negative for congestion, postnasal drip, rhinorrhea, sneezing and sore throat.   Respiratory: Negative for cough, chest tightness, shortness of breath and wheezing.   Cardiovascular: Negative for chest pain and palpitations.  Gastrointestinal: Negative for abdominal pain, constipation, diarrhea, nausea and vomiting.  Endocrine: Negative for cold intolerance, heat intolerance, polydipsia and polyuria.  Musculoskeletal: Positive for back pain. Negative for arthralgias, joint swelling and neck pain.       Chronic low back pain with muscle aches.   Skin: Negative for rash.  Allergic/Immunologic: Negative for environmental allergies.  Neurological: Negative for dizziness, tremors, numbness and headaches.  Hematological: Negative for adenopathy. Does not bruise/bleed easily.  Psychiatric/Behavioral: Positive for behavioral problems (Depression) and decreased concentration. Negative for sleep disturbance and suicidal ideas. The patient is nervous/anxious.        Well managed with current medication.     Today's Vitals   05/14/19 1118 05/14/19 1133  BP: (!) 135/102 (!) 142/94  Pulse: 77 75  Resp: 16   Temp: (!) 97.5 F (36.4 C)   SpO2: 98%   Weight: 175 lb 9.6 oz (79.7 kg)   Height: 5\' 2"  (1.575 m)    Body mass index is 32.12 kg/m.  Physical Exam Vitals and nursing note reviewed.  Constitutional:      General: She is not in acute distress.    Appearance: Normal appearance. She is well-developed. She is not diaphoretic.  HENT:     Head: Normocephalic and atraumatic.     Nose: Nose  normal.     Mouth/Throat:     Pharynx: No oropharyngeal exudate.  Eyes:     Pupils: Pupils are equal, round, and reactive to light.  Neck:     Thyroid: No thyromegaly.     Vascular: No carotid bruit or JVD.     Trachea: No tracheal deviation.  Cardiovascular:     Rate and Rhythm: Normal rate and regular rhythm.     Pulses: Normal pulses.     Heart sounds: Normal heart sounds. No murmur. No friction rub. No gallop.   Pulmonary:     Effort: Pulmonary effort is normal. No respiratory distress.     Breath sounds: Normal breath sounds. No wheezing or rales.  Chest:     Chest wall: No tenderness.     Breasts:        Right: Normal. No swelling, bleeding, inverted nipple, mass, nipple discharge, skin change or tenderness.        Left: Normal. No swelling, bleeding, inverted nipple, mass, nipple discharge, skin change or tenderness.  Abdominal:     General: Bowel sounds are normal.     Palpations: Abdomen is soft.     Tenderness: There is no abdominal tenderness.  Musculoskeletal:        General: Normal range of motion.     Cervical back: Normal range of motion and neck supple.  Lymphadenopathy:     Cervical: No cervical adenopathy.     Upper Body:     Right upper body: No axillary adenopathy.     Left upper body: No axillary adenopathy.  Skin:    General: Skin is warm and dry.  Neurological:     Mental Status: She is alert and oriented to person, place, and time.     Cranial Nerves: No cranial nerve deficit.  Psychiatric:        Mood and Affect: Mood normal.        Behavior: Behavior normal.        Thought Content: Thought content normal.        Judgment: Judgment normal.    LABS: Recent Results (from the past 2160 hour(s))  POCT Urinalysis Dipstick     Status: None   Collection Time: 03/30/19  4:33 PM  Result Value Ref Range   Color, UA     Clarity, UA     Glucose, UA Negative Negative   Bilirubin, UA Negative    Ketones, UA Negative    Spec Grav, UA 1.010 1.010 -  1.025   Blood, UA Negative    pH, UA 6.5 5.0 - 8.0   Protein, UA Negative Negative   Urobilinogen, UA 0.2 0.2 or 1.0 E.U./dL   Nitrite, UA Negative    Leukocytes, UA Negative Negative   Appearance     Odor    POCT Urine Drug Screen     Status: Abnormal   Collection Time: 03/30/19  4:33 PM  Result Value Ref Range   POC METHAMPHETAMINE UR  None Detected None Detected   POC Opiate Ur None Detected None Detected   POC Barbiturate UR None Detected None Detected   POC Amphetamine UR None Detected None Detected   POC Oxycodone UR None Detected None Detected   POC Cocaine UR None Detected None Detected   POC Ecstasy UR None Detected None Detected   POC TRICYCLICS UR None Detected None Detected   POC PHENCYCLIDINE UR None Detected None Detected   POC MARIJUANA UR Positive (A) None Detected   POC METHADONE UR None Detected None Detected   POC BENZODIAZEPINES UR None Detected None Detected   URINE TEMPERATURE     POC DRUG SCREEN OXIDANTS URINE     POC SPECIFIC GRAVITY URINE     POC PH URINE     Methylenedioxyamphetamine    UA/M w/rflx Culture, Routine     Status: Abnormal   Collection Time: 05/14/19 11:20 AM   Specimen: Urine   URINE  Result Value Ref Range   Specific Gravity, UA 1.023 1.005 - 1.030   pH, UA 5.5 5.0 - 7.5   Color, UA Yellow Yellow   Appearance Ur Clear Clear   Leukocytes,UA Negative Negative   Protein,UA Negative Negative/Trace   Glucose, UA Negative Negative   Ketones, UA Trace (A) Negative   RBC, UA Negative Negative   Bilirubin, UA Negative Negative   Urobilinogen, Ur 1.0 0.2 - 1.0 mg/dL   Nitrite, UA Negative Negative   Microscopic Examination Comment     Comment: Microscopic follows if indicated.   Microscopic Examination See below:     Comment: Microscopic was indicated and was performed.   Urinalysis Reflex Comment     Comment: This specimen will not reflex to a Urine Culture.  Microscopic Examination     Status: None   Collection Time: 05/14/19 11:20  AM   URINE  Result Value Ref Range   WBC, UA 0-5 0 - 5 /hpf   RBC 0-2 0 - 2 /hpf   Epithelial Cells (non renal) 0-10 0 - 10 /hpf   Casts None seen None seen /lpf   Mucus, UA Present Not Estab.   Bacteria, UA None seen None seen/Few    Assessment/Plan:  1. Encounter for general adult medical examination with abnormal findings Annual health maintenance exam today.   2. Gastroesophageal reflux disease without esophagitis May take OTC pepcid as needed and as indicated.   3. Mixed hyperlipidemia Fasting lipid panel ordered. Adjust cholesterol lowering medication as indicated.   4. GAD (generalized anxiety disorder) Continue lexapro daily. May take clonazepam 0.5mg  as needed and as prescribed. A new prescription was sent to her pharamcy today.  - clonazePAM (KLONOPIN) 0.5 MG tablet; Take 1 tablet (0.5 mg total) by mouth 2 (two) times daily as needed for anxiety.  Dispense: 45 tablet; Refill: 2  5. Encounter for screening mammogram for malignant neoplasm of breast - scrrening mammo; Future  6. Dysuria - UA/M w/rflx Culture, Routine   General Counseling: Lyzbeth verbalizes understanding of the findings of todays visit and agrees with plan of treatment. I have discussed any further diagnostic evaluation that may be needed or ordered today. We also reviewed her medications today. she has been encouraged to call the office with any questions or concerns that should arise related to todays visit.    Counseling:  This patient was seen by Leretha Pol FNP Collaboration with Dr Lavera Guise as a part of collaborative care agreement  Orders Placed This Encounter  Procedures  . Microscopic  Examination  . scrrening mammo  . UA/M w/rflx Culture, Routine    Meds ordered this encounter  Medications  . clonazePAM (KLONOPIN) 0.5 MG tablet    Sig: Take 1 tablet (0.5 mg total) by mouth 2 (two) times daily as needed for anxiety.    Dispense:  45 tablet    Refill:  2    Order Specific  Question:   Supervising Provider    Answer:   Lavera Guise [id number]    Time spent: Leona, MD  Internal Medicine

## 2019-05-15 LAB — MICROSCOPIC EXAMINATION
Bacteria, UA: NONE SEEN
Casts: NONE SEEN /lpf

## 2019-05-15 LAB — UA/M W/RFLX CULTURE, ROUTINE
Bilirubin, UA: NEGATIVE
Glucose, UA: NEGATIVE
Leukocytes,UA: NEGATIVE
Nitrite, UA: NEGATIVE
Protein,UA: NEGATIVE
RBC, UA: NEGATIVE
Specific Gravity, UA: 1.023 (ref 1.005–1.030)
Urobilinogen, Ur: 1 mg/dL (ref 0.2–1.0)
pH, UA: 5.5 (ref 5.0–7.5)

## 2019-05-16 DIAGNOSIS — Z0001 Encounter for general adult medical examination with abnormal findings: Secondary | ICD-10-CM | POA: Insufficient documentation

## 2019-06-16 ENCOUNTER — Other Ambulatory Visit: Payer: Self-pay | Admitting: Nurse Practitioner

## 2019-06-16 DIAGNOSIS — Z1231 Encounter for screening mammogram for malignant neoplasm of breast: Secondary | ICD-10-CM

## 2019-06-17 ENCOUNTER — Ambulatory Visit
Admission: RE | Admit: 2019-06-17 | Discharge: 2019-06-17 | Disposition: A | Discharge status: Home / Self Care | Payer: Managed Care, Other (non HMO) | Source: Ambulatory Visit | Attending: Nurse Practitioner | Admitting: Nurse Practitioner

## 2019-06-17 DIAGNOSIS — Z1231 Encounter for screening mammogram for malignant neoplasm of breast: Secondary | ICD-10-CM | POA: Diagnosis present

## 2019-06-17 NOTE — Progress Notes (Signed)
Negative mammogram

## 2019-07-05 ENCOUNTER — Ambulatory Visit: Payer: Managed Care, Other (non HMO) | Admitting: Nurse Practitioner

## 2019-07-15 ENCOUNTER — Other Ambulatory Visit: Payer: Self-pay

## 2019-07-15 DIAGNOSIS — M545 Low back pain, unspecified: Secondary | ICD-10-CM

## 2019-07-15 MED ORDER — ETODOLAC 200 MG PO CAPS: 200.0000 mg | ORAL_CAPSULE | Freq: Two times a day (BID) | ORAL | 2 refills | Status: DC

## 2019-08-09 ENCOUNTER — Other Ambulatory Visit: Payer: Self-pay | Admitting: Nurse Practitioner

## 2019-08-10 LAB — TSH: TSH: 1.25 u[IU]/mL (ref 0.450–4.500)

## 2019-08-10 LAB — COMPREHENSIVE METABOLIC PANEL
ALT: 17 IU/L (ref 0–32)
AST: 23 IU/L (ref 0–40)
Albumin/Globulin Ratio: 1.9 (ref 1.2–2.2)
Albumin: 4.4 g/dL (ref 3.8–4.8)
Alkaline Phosphatase: 77 IU/L (ref 39–117)
BUN/Creatinine Ratio: 18 (ref 12–28)
BUN: 14 mg/dL (ref 8–27)
Bilirubin Total: 0.3 mg/dL (ref 0.0–1.2)
CO2: 23 mmol/L (ref 20–29)
Calcium: 9.3 mg/dL (ref 8.7–10.3)
Chloride: 102 mmol/L (ref 96–106)
Creatinine, Ser: 0.76 mg/dL (ref 0.57–1.00)
GFR calc Af Amer: 96 mL/min/{1.73_m2} (ref >59)
GFR calc non Af Amer: 83 mL/min/{1.73_m2} (ref >59)
Globulin, Total: 2.3 g/dL (ref 1.5–4.5)
Glucose: 100 mg/dL — ABNORMAL HIGH (ref 65–99)
Potassium: 4.4 mmol/L (ref 3.5–5.2)
Sodium: 139 mmol/L (ref 134–144)
Total Protein: 6.7 g/dL (ref 6.0–8.5)

## 2019-08-10 LAB — CBC
Hematocrit: 40.1 % (ref 34.0–46.6)
Hemoglobin: 13.9 g/dL (ref 11.1–15.9)
MCH: 31.7 pg (ref 26.6–33.0)
MCHC: 34.7 g/dL (ref 31.5–35.7)
MCV: 91 fL (ref 79–97)
Platelets: 299 10*3/uL (ref 150–450)
RBC: 4.39 x10E6/uL (ref 3.77–5.28)
RDW: 12.4 % (ref 11.7–15.4)
WBC: 6.4 10*3/uL (ref 3.4–10.8)

## 2019-08-10 LAB — LIPID PANEL W/O CHOL/HDL RATIO
Cholesterol, Total: 168 mg/dL (ref 100–199)
HDL: 51 mg/dL (ref >39)
LDL Chol Calc (NIH): 76 mg/dL (ref 0–99)
Triglycerides: 253 mg/dL — ABNORMAL HIGH (ref 0–149)
VLDL Cholesterol Cal: 41 mg/dL — ABNORMAL HIGH (ref 5–40)

## 2019-08-10 LAB — VITAMIN D 25 HYDROXY (VIT D DEFICIENCY, FRACTURES): Vit D, 25-Hydroxy: 22 ng/mL — ABNORMAL LOW (ref 30.0–100.0)

## 2019-08-10 LAB — T4, FREE: Free T4: 0.98 ng/dL (ref 0.82–1.77)

## 2019-08-12 ENCOUNTER — Telehealth: Payer: Self-pay

## 2019-08-12 NOTE — Telephone Encounter (Signed)
LMOM FOR PATIENT TO CONFIRM AND SCREEN FOR 08-16-19 OV. 

## 2019-08-16 ENCOUNTER — Encounter: Payer: Self-pay | Admitting: Nurse Practitioner

## 2019-08-16 ENCOUNTER — Ambulatory Visit: Payer: Managed Care, Other (non HMO) | Admitting: Nurse Practitioner

## 2019-08-16 ENCOUNTER — Other Ambulatory Visit: Payer: Self-pay

## 2019-08-16 VITALS — BP 144/78 | HR 63 | Temp 97.2°F | Resp 16 | Ht 62.0 in | Wt 177.6 lb

## 2019-08-16 DIAGNOSIS — E782 Mixed hyperlipidemia: Secondary | ICD-10-CM | POA: Diagnosis not present

## 2019-08-16 DIAGNOSIS — F411 Generalized anxiety disorder: Secondary | ICD-10-CM | POA: Diagnosis not present

## 2019-08-16 DIAGNOSIS — J300 Vasomotor rhinitis: Secondary | ICD-10-CM

## 2019-08-16 DIAGNOSIS — R41840 Attention and concentration deficit: Secondary | ICD-10-CM | POA: Diagnosis not present

## 2019-08-16 MED ORDER — AMPHETAMINE-DEXTROAMPHETAMINE 5 MG PO TABS: 5.0000 mg | ORAL_TABLET | Freq: Two times a day (BID) | ORAL | 0 refills | Status: DC

## 2019-08-16 MED ORDER — FLUTICASONE PROPIONATE 50 MCG/ACT NA SUSP: 2.0000 | Freq: Every day | NASAL | 3 refills | Status: DC

## 2019-08-16 MED ORDER — CLONAZEPAM 0.5 MG PO TABS: 0.5000 mg | ORAL_TABLET | Freq: Two times a day (BID) | ORAL | 2 refills | Status: DC | PRN

## 2019-08-16 NOTE — Progress Notes (Signed)
[facility] [place], [place] [address]  Internal MEDICINE  Office Visit Note  Patient Name: [name]  [id number]  [id number]  Date of Service: 08/22/2019  Chief Complaint  Patient presents with  . Hyperlipidemia  . Gastroesophageal Reflux  . ADD  . Follow-up    review labs     he patient is here for routine follow up. The patient states that she is doing very well. She recently got a promotion at work. Is working longer hours and has increased responsibility.  Does well managing this with her current medications. She also takes adderall 5mg  twice daily while working. She has been taking this ore often, recently, as work demands have increased as have the hours and working days. She is able to take this, stay on track and focused. She has no negative side effects from taking this. She does need to have refills for her routine medications today. She did have routine, fasting labs done prior to this visit. She has moderate elevation of triglyceride levels. Her other labs were unremarkable. Screening mammogram was done 06/17/2019 which was negative.       Current Medication: Outpatient Encounter Medications as of 08/16/2019  Medication Sig  . amphetamine-dextroamphetamine (ADDERALL) 5 MG tablet Take 1 tablet (5 mg total) by mouth 2 (two) times daily with a meal.  . atorvastatin (LIPITOR) 40 MG tablet Take 1 tablet (40 mg total) by mouth daily at 6 PM.  . busPIRone (BUSPAR) 5 MG tablet Take 1 tablet po BID prn anxiety  . clonazePAM (KLONOPIN) 0.5 MG tablet Take 1 tablet (0.5 mg total) by mouth 2 (two) times daily as needed for anxiety.  Marland Kitchen escitalopram (LEXAPRO) 20 MG tablet Take 1 tablet (20 mg total) by mouth daily.  . fenofibrate 54 MG tablet Take 1 tablet (54 mg total) by mouth daily.  . fluticasone (FLONASE) 50 MCG/ACT nasal spray Place 2 sprays into both nostrils daily.  . meloxicam (MOBIC) 15 MG tablet Take 15 mg by mouth daily.  . methocarbamol  (ROBAXIN) 500 MG tablet Take 500 mg by mouth 4 (four) times daily. Take one tablet twice daily  . [DISCONTINUED] amphetamine-dextroamphetamine (ADDERALL) 5 MG tablet Take 1 tablet (5 mg total) by mouth 2 (two) times daily with a meal.  . [DISCONTINUED] amphetamine-dextroamphetamine (ADDERALL) 5 MG tablet Take 1 tablet (5 mg total) by mouth 2 (two) times daily with a meal.  . [DISCONTINUED] amphetamine-dextroamphetamine (ADDERALL) 5 MG tablet Take 1 tablet (5 mg total) by mouth 2 (two) times daily with a meal.  . [DISCONTINUED] clonazePAM (KLONOPIN) 0.5 MG tablet Take 1 tablet (0.5 mg total) by mouth 2 (two) times daily as needed for anxiety.  . [DISCONTINUED] fluticasone (FLONASE) 50 MCG/ACT nasal spray Place 2 sprays into both nostrils daily.  . [DISCONTINUED] etodolac (LODINE) 200 MG capsule Take 1 capsule (200 mg total) by mouth 2 (two) times daily. (Patient not taking: Reported on 08/16/2019)   No facility-administered encounter medications on file as of 08/16/2019.    Surgical History: Past Surgical History:  Procedure Laterality Date  . TOE SURGERY  2005,2011    Medical History: Past Medical History:  Diagnosis Date  . Allergy   . Anxiety   . GERD (gastroesophageal reflux disease)   . Heart burn   . Hyperlipidemia     Family History: Family History  Problem Relation Age of Onset  . Lung cancer Mother   . Heart disease Father   . Pancreatic cancer Sister  Social History   Socioeconomic History  . Marital status: Married    Spouse name: Not on file  . Number of children: Not on file  . Years of education: Not on file  . Highest education level: Not on file  Occupational History  . Not on file  Tobacco Use  . Smoking status: Never Smoker  . Smokeless tobacco: Never Used  Substance and Sexual Activity  . Alcohol use: Yes    Comment: ocassionally   . Drug use: Yes    Types: Marijuana  . Sexual activity: Not on file  Other Topics Concern  . Not on file  Social  History Narrative  . Not on file   Social Determinants of Health   Financial Resource Strain:   . Difficulty of Paying Living Expenses:   Food Insecurity:   . Worried About Charity fundraiser in the Last Year:   . Arboriculturist in the Last Year:   Transportation Needs:   . Film/video editor (Medical):   Marland Kitchen Lack of Transportation (Non-Medical):   Physical Activity:   . Days of Exercise per Week:   . Minutes of Exercise per Session:   Stress:   . Feeling of Stress :   Social Connections:   . Frequency of Communication with Friends and Family:   . Frequency of Social Gatherings with Friends and Family:   . Attends Religious Services:   . Active Member of Clubs or Organizations:   . Attends Archivist Meetings:   Marland Kitchen Marital Status:   Intimate Partner Violence:   . Fear of Current or Ex-Partner:   . Emotionally Abused:   Marland Kitchen Physically Abused:   . Sexually Abused:       Review of Systems  Constitutional: Negative for activity change, chills, fatigue and unexpected weight change.  HENT: Negative for congestion, postnasal drip, rhinorrhea, sneezing and sore throat.   Respiratory: Negative for cough, chest tightness, shortness of breath and wheezing.   Cardiovascular: Negative for chest pain and palpitations.  Gastrointestinal: Negative for abdominal pain, constipation, diarrhea, nausea and vomiting.  Endocrine: Negative for cold intolerance, heat intolerance, polydipsia and polyuria.  Musculoskeletal: Positive for back pain. Negative for arthralgias, joint swelling and neck pain.       Chronic low back pain with muscle aches.   Skin: Negative for rash.  Allergic/Immunologic: Negative for environmental allergies.  Neurological: Negative for dizziness, tremors, numbness and headaches.  Hematological: Negative for adenopathy. Does not bruise/bleed easily.  Psychiatric/Behavioral: Positive for behavioral problems (Depression) and decreased concentration. Negative for  sleep disturbance and suicidal ideas. The patient is nervous/anxious.        Well managed with current medication.    Today's Vitals   08/16/19 1044  BP: (!) 144/78  Pulse: 63  Resp: 16  Temp: (!) 97.2 F (36.2 C)  SpO2: 99%  Weight: 177 lb 9.6 oz (80.6 kg)  Height: 5\' 2"  (1.575 m)   Body mass index is 32.48 kg/m.  Physical Exam Vitals and nursing note reviewed.  Constitutional:      General: She is not in acute distress.    Appearance: Normal appearance. She is well-developed. She is not diaphoretic.  HENT:     Head: Normocephalic and atraumatic.     Nose: Nose normal.     Mouth/Throat:     Pharynx: No oropharyngeal exudate.  Eyes:     Conjunctiva/sclera: Conjunctivae normal.     Pupils: Pupils are equal, round, and reactive to light.  Neck:     Thyroid: No thyromegaly.     Vascular: No carotid bruit or JVD.     Trachea: No tracheal deviation.  Cardiovascular:     Rate and Rhythm: Normal rate and regular rhythm.     Heart sounds: Normal heart sounds. No murmur. No friction rub. No gallop.   Pulmonary:     Effort: Pulmonary effort is normal. No respiratory distress.     Breath sounds: Normal breath sounds. No wheezing or rales.  Chest:     Chest wall: No tenderness.  Abdominal:     Palpations: Abdomen is soft.  Musculoskeletal:     Cervical back: Normal range of motion and neck supple.     Comments: Moderate lower back pain, more severe with bending and twisting at the waist. Radiates to the hips. No viaible or palpable abnormalities or deformities noted today.  Lymphadenopathy:     Cervical: No cervical adenopathy.  Skin:    General: Skin is warm and dry.  Neurological:     Mental Status: She is alert and oriented to person, place, and time.     Cranial Nerves: No cranial nerve deficit.  Psychiatric:        Mood and Affect: Mood is anxious.        Behavior: Behavior normal.        Thought Content: Thought content normal.        Judgment: Judgment normal.    Assessment/Plan: 1. Mixed hyperlipidemia Moderate elevation of triglyceride levels, though improved from most recent check. Continue cholesterol lowering medication as prescribed. Heart healthy diet recommended   2. Vasomotor rhinitis Add back flonase nasal spray. Use two sprays in both nostrils daily.  - fluticasone (FLONASE) 50 MCG/ACT nasal spray; Place 2 sprays into both nostrils daily.  Dispense: 48 g; Refill: 3  3. GAD (generalized anxiety disorder) Continue lexapro daily. May take clonazepam 0.5mg  twice daily as needed for acute anxiety. New prescription sent to her pharmacy today.  - clonazePAM (KLONOPIN) 0.5 MG tablet; Take 1 tablet (0.5 mg total) by mouth 2 (two) times daily as needed for anxiety.  Dispense: 45 tablet; Refill: 2  4. Attention and concentration deficit May continue adderall 5mg  twice daily as needed for focus and concentration. Three 30 day prescriptions sent to her pharmacy today. Dates arer 08/16/2019, 09/14/2019, and 11/12/2019 - amphetamine-dextroamphetamine (ADDERALL) 5 MG tablet; Take 1 tablet (5 mg total) by mouth 2 (two) times daily with a meal.  Dispense: 60 tablet; Refill: 0  General Counseling: Paulette verbalizes understanding of the findings of todays visit and agrees with plan of treatment. I have discussed any further diagnostic evaluation that may be needed or ordered today. We also reviewed her medications today. she has been encouraged to call the office with any questions or concerns that should arise related to todays visit.   Refilled Controlled medications today. Reviewed risks and possible side effects associated with taking Stimulants. Combination of these drugs with other psychotropic medications could cause dizziness and drowsiness. Pt needs to Monitor symptoms and exercise caution in driving and operating heavy machinery to avoid damages to oneself, to others and to the surroundings. Patient verbalized understanding in this matter. Dependence and  abuse for these drugs will be monitored closely. A Controlled substance policy and procedure is on file which allows [facility] to order a urine drug screen test at any visit. Patient understands and agrees with the plan..  This patient was seen by Leretha Pol FNP Collaboration with Dr  Lavera Guise as a part of collaborative care agreement  Meds ordered this encounter  Medications  . DISCONTD: amphetamine-dextroamphetamine (ADDERALL) 5 MG tablet    Sig: Take 1 tablet (5 mg total) by mouth 2 (two) times daily with a meal.    Dispense:  60 tablet    Refill:  0    Order Specific Question:   Supervising Provider    Answer:   Lavera Guise [place]  . DISCONTD: amphetamine-dextroamphetamine (ADDERALL) 5 MG tablet    Sig: Take 1 tablet (5 mg total) by mouth 2 (two) times daily with a meal.    Dispense:  60 tablet    Refill:  0    Fill after 09/14/2019    Order Specific Question:   Supervising Provider    Answer:   Lavera Guise [place]  . amphetamine-dextroamphetamine (ADDERALL) 5 MG tablet    Sig: Take 1 tablet (5 mg total) by mouth 2 (two) times daily with a meal.    Dispense:  60 tablet    Refill:  0    Fill after 10/12/2019    Order Specific Question:   Supervising Provider    Answer:   Lavera Guise [place]  . fluticasone (FLONASE) 50 MCG/ACT nasal spray    Sig: Place 2 sprays into both nostrils daily.    Dispense:  48 g    Refill:  3    This is for 90 day prescription.    Order Specific Question:   Supervising Provider    Answer:   Lavera Guise [id number]  . clonazePAM (KLONOPIN) 0.5 MG tablet    Sig: Take 1 tablet (0.5 mg total) by mouth 2 (two) times daily as needed for anxiety.    Dispense:  45 tablet    Refill:  2    Order Specific Question:   Supervising Provider    Answer:   Lavera Guise [id number]    Total time spent: 30 Minutes  Time spent includes review of chart, medications, test results, and follow up plan with the patient.      Dr Lavera Guise Internal medicine

## 2019-08-16 NOTE — Progress Notes (Signed)
Reviewed with patient during visit

## 2019-08-22 DIAGNOSIS — J300 Vasomotor rhinitis: Secondary | ICD-10-CM | POA: Insufficient documentation

## 2019-10-27 ENCOUNTER — Other Ambulatory Visit: Payer: Self-pay

## 2019-10-27 DIAGNOSIS — E782 Mixed hyperlipidemia: Secondary | ICD-10-CM

## 2019-10-27 MED ORDER — FENOFIBRATE 54 MG PO TABS: 54.0000 mg | ORAL_TABLET | Freq: Every day | ORAL | 3 refills | Status: DC

## 2019-11-12 ENCOUNTER — Other Ambulatory Visit: Payer: Self-pay | Admitting: Specialist

## 2019-11-12 ENCOUNTER — Telehealth: Payer: Self-pay

## 2019-11-12 NOTE — Telephone Encounter (Signed)
Lmom to confirm and screen for 11-16-19 ov. 

## 2019-11-15 ENCOUNTER — Other Ambulatory Visit: Payer: Self-pay

## 2019-11-15 ENCOUNTER — Encounter
Admission: RE | Admit: 2019-11-15 | Discharge: 2019-11-15 | Disposition: A | Discharge status: Home / Self Care | Payer: Managed Care, Other (non HMO) | Source: Ambulatory Visit | Attending: Specialist | Admitting: Specialist

## 2019-11-15 HISTORY — DX: Diverticulosis of intestine, part unspecified, without perforation or abscess without bleeding: K57.90

## 2019-11-15 HISTORY — DX: Attention-deficit hyperactivity disorder, unspecified type: F90.9

## 2019-11-15 HISTORY — DX: Polyp of colon: K63.5

## 2019-11-15 NOTE — Patient Instructions (Addendum)
COVID TESTING Date: Thursday, June 17 Testing site:  [facility] [address] Thru Hours:  0:93 am - 1:00 pm Once you are tested, you are asked to stay quarantined (avoiding public places) until after your surgery.   Your procedure is scheduled on:  Monday, June 21 Report to Day Surgery on the 2nd floor of the [facility]. To find out your arrival time, please call [phone] between 1PM - 3PM on: Friday, June 18  REMEMBER: Instructions that are not followed completely may result in serious medical risk, up to and including death; or upon the discretion of your surgeon and anesthesiologist your surgery may need to be rescheduled.  Do not eat food after midnight the night before surgery.  No gum chewing, lozengers or hard candies.  You may however, drink CLEAR liquids up to 2 hours before you are scheduled to arrive for your surgery. Do not drink anything within 2 hours of your scheduled arrival time.  Clear liquids include: - water  - apple juice without pulp - gatorade (not RED) - black coffee or tea (Do NOT add milk or creamers to the coffee or tea) Do NOT drink anything that is not on this list.  TAKE THESE MEDICATIONS THE MORNING OF SURGERY WITH A SIP OF WATER:  1.  Lexapro 2.  Flonase nasal spray 3.  Omeprazole - (take one the night before and one on the morning of surgery - helps to prevent nausea after surgery.)  Stop Anti-inflammatories (NSAIDS) such as Advil, Aleve, Ibuprofen, Motrin, Naproxen, Naprosyn and Aspirin based products such as Excedrin, Goodys Powder, BC Powder. (May take Tylenol or Acetaminophen if needed.)  Stop ANY OVER THE COUNTER supplements until after surgery.  No Alcohol for 24 hours before or after surgery.  Do not use any "recreational" drugs for at least a week prior to your surgery.  Please be advised that the combination of cocaine and anesthesia may have negative outcomes, up to and including death. If you  test positive for cocaine, your surgery will be cancelled.  On the morning of surgery brush your teeth with toothpaste and water, you may rinse your mouth with mouthwash if you wish. Do not swallow any toothpaste or mouthwash.  Do not wear jewelry, make-up, hairpins, clips or nail polish.  Do not wear lotions, powders, or perfumes.   Do not shave 48 hours prior to surgery.   Do not bring valuables to the hospital. [facility] is not responsible for any missing/lost belongings or valuables.   Use CHG Soap as directed on instruction sheet.  Notify your doctor if there is any change in your medical condition (cold, fever, infection).  Wear comfortable clothing (specific to your surgery type) to the hospital.  Plan for stool softeners for home use; pain medications have a tendency to cause constipation. You can also help prevent constipation by eating foods high in fiber such as fruits and vegetables and drinking plenty of fluids as your diet allows.  After surgery, you can help prevent lung complications by doing breathing exercises.  Take deep breaths and cough every 1-2 hours. Your doctor may order a device called an Incentive Spirometer to help you take deep breaths.  If you are being discharged the day of surgery, you will not be allowed to drive home. You will need a responsible adult (18 years or older) to drive you home and stay with you that night.   If you are taking public transportation, you will need  to have a responsible adult (18 years or older) with you. Please confirm with your physician that it is acceptable to use public transportation.   Please call the [facility] Dept. at [phone] if you have any questions about these instructions.  Visitation Policy:  Patients undergoing a surgery or procedure may have one family member or support person with them as long as that person is not COVID-19 positive or experiencing its symptoms.  That person may  remain in the waiting area during the procedure.  As a reminder, masks are still required for all [facility] team members, patients and visitors in all [facility] facilities.   Systemwide, no visitors 17 or younger.

## 2019-11-16 ENCOUNTER — Ambulatory Visit: Payer: Managed Care, Other (non HMO) | Admitting: Nurse Practitioner

## 2019-11-18 ENCOUNTER — Other Ambulatory Visit
Admission: RE | Admit: 2019-11-18 | Discharge: 2019-11-18 | Disposition: A | Discharge status: Home / Self Care | Payer: Managed Care, Other (non HMO) | Source: Ambulatory Visit | Attending: Specialist | Admitting: Specialist

## 2019-11-18 ENCOUNTER — Other Ambulatory Visit: Payer: Self-pay

## 2019-11-18 DIAGNOSIS — Z20822 Contact with and (suspected) exposure to covid-19: Secondary | ICD-10-CM | POA: Insufficient documentation

## 2019-11-18 DIAGNOSIS — Z01812 Encounter for preprocedural laboratory examination: Secondary | ICD-10-CM | POA: Diagnosis present

## 2019-11-18 LAB — SARS CORONAVIRUS 2 (TAT 6-24 HRS): SARS Coronavirus 2: NEGATIVE

## 2019-11-22 ENCOUNTER — Ambulatory Visit: Payer: Managed Care, Other (non HMO) | Admitting: Certified Registered Nurse Anesthetist

## 2019-11-22 ENCOUNTER — Other Ambulatory Visit: Payer: Self-pay

## 2019-11-22 ENCOUNTER — Ambulatory Visit
Admission: RE | Admit: 2019-11-22 | Discharge: 2019-11-22 | Disposition: A | Discharge status: Home / Self Care | Payer: Managed Care, Other (non HMO) | Source: Ambulatory Visit | Attending: Specialist | Admitting: Specialist

## 2019-11-22 ENCOUNTER — Encounter
Admission: RE | Disposition: A | Discharge status: Home / Self Care | Payer: Self-pay | Source: Ambulatory Visit | Attending: Specialist

## 2019-11-22 ENCOUNTER — Encounter: Payer: Self-pay | Admitting: Specialist

## 2019-11-22 DIAGNOSIS — F419 Anxiety disorder, unspecified: Secondary | ICD-10-CM | POA: Diagnosis not present

## 2019-11-22 DIAGNOSIS — K219 Gastro-esophageal reflux disease without esophagitis: Secondary | ICD-10-CM | POA: Diagnosis not present

## 2019-11-22 DIAGNOSIS — G5601 Carpal tunnel syndrome, right upper limb: Secondary | ICD-10-CM | POA: Insufficient documentation

## 2019-11-22 HISTORY — PX: CARPAL TUNNEL RELEASE: SHX101

## 2019-11-22 LAB — URINE DRUG SCREEN, QUALITATIVE (ARMC ONLY)
Amphetamines, Ur Screen: NOT DETECTED
Barbiturates, Ur Screen: NOT DETECTED
Benzodiazepine, Ur Scrn: NOT DETECTED
Cannabinoid 50 Ng, Ur ~~LOC~~: POSITIVE — AB
Cocaine Metabolite,Ur ~~LOC~~: NOT DETECTED
MDMA (Ecstasy)Ur Screen: NOT DETECTED
Methadone Scn, Ur: NOT DETECTED
Opiate, Ur Screen: NOT DETECTED
Phencyclidine (PCP) Ur S: NOT DETECTED
Tricyclic, Ur Screen: NOT DETECTED

## 2019-11-22 SURGERY — CARPAL TUNNEL RELEASE
Anesthesia: General | Laterality: Right

## 2019-11-22 MED ORDER — MELOXICAM 7.5 MG PO TABS: 15.0000 mg | ORAL_TABLET | ORAL | Status: AC

## 2019-11-22 MED ORDER — FENTANYL CITRATE (PF) 100 MCG/2ML IJ SOLN
INTRAMUSCULAR | Status: AC
  Filled 2019-11-22: qty 2

## 2019-11-22 MED ORDER — FENTANYL CITRATE (PF) 100 MCG/2ML IJ SOLN
INTRAMUSCULAR | Status: DC | PRN
  Administered 2019-11-22 (×3): 25 ug via INTRAVENOUS

## 2019-11-22 MED ORDER — DEXAMETHASONE SODIUM PHOSPHATE 10 MG/ML IJ SOLN
INTRAMUSCULAR | Status: DC | PRN
  Administered 2019-11-22: 10 mg via INTRAVENOUS

## 2019-11-22 MED ORDER — ONDANSETRON HCL 4 MG/2ML IJ SOLN: 4.0000 mg | Freq: Once | INTRAMUSCULAR | Status: DC | PRN

## 2019-11-22 MED ORDER — CHLORHEXIDINE GLUCONATE CLOTH 2 % EX PADS: 6.0000 | MEDICATED_PAD | Freq: Once | CUTANEOUS | Status: DC

## 2019-11-22 MED ORDER — CLINDAMYCIN PHOSPHATE 600 MG/50ML IV SOLN
600.0000 mg | INTRAVENOUS | Status: AC
  Administered 2019-11-22: 600 mg via INTRAVENOUS

## 2019-11-22 MED ORDER — GABAPENTIN 400 MG PO CAPS
400.0000 mg | ORAL_CAPSULE | Freq: Three times a day (TID) | ORAL | 3 refills | Status: DC
Start: 2019-11-22 — End: 2020-11-20

## 2019-11-22 MED ORDER — FENTANYL CITRATE (PF) 100 MCG/2ML IJ SOLN: 25.0000 ug | INTRAMUSCULAR | Status: DC | PRN

## 2019-11-22 MED ORDER — CHLORHEXIDINE GLUCONATE 0.12 % MT SOLN: 15.0000 mL | Freq: Once | OROMUCOSAL | Status: AC

## 2019-11-22 MED ORDER — CLINDAMYCIN PHOSPHATE 600 MG/50ML IV SOLN
INTRAVENOUS | Status: AC
  Filled 2019-11-22: qty 50

## 2019-11-22 MED ORDER — LACTATED RINGERS IV SOLN: INTRAVENOUS | Status: DC

## 2019-11-22 MED ORDER — GABAPENTIN 300 MG PO CAPS
ORAL_CAPSULE | ORAL | Status: AC
  Administered 2019-11-22: 300 mg via ORAL
  Filled 2019-11-22: qty 1

## 2019-11-22 MED ORDER — GABAPENTIN 300 MG PO CAPS: 300.0000 mg | ORAL_CAPSULE | ORAL | Status: AC

## 2019-11-22 MED ORDER — BUPIVACAINE HCL (PF) 0.5 % IJ SOLN
INTRAMUSCULAR | Status: AC
  Filled 2019-11-22: qty 30

## 2019-11-22 MED ORDER — HYDRALAZINE HCL 20 MG/ML IJ SOLN
INTRAMUSCULAR | Status: AC
  Filled 2019-11-22: qty 1

## 2019-11-22 MED ORDER — ONDANSETRON HCL 4 MG/2ML IJ SOLN
INTRAMUSCULAR | Status: DC | PRN
  Administered 2019-11-22: 4 mg via INTRAVENOUS

## 2019-11-22 MED ORDER — MIDAZOLAM HCL 2 MG/2ML IJ SOLN
INTRAMUSCULAR | Status: DC | PRN
  Administered 2019-11-22: 2 mg via INTRAVENOUS

## 2019-11-22 MED ORDER — LIDOCAINE HCL (CARDIAC) PF 100 MG/5ML IV SOSY
PREFILLED_SYRINGE | INTRAVENOUS | Status: DC | PRN
  Administered 2019-11-22: 80 mg via INTRAVENOUS

## 2019-11-22 MED ORDER — HYDROCODONE-ACETAMINOPHEN 5-325 MG PO TABS
ORAL_TABLET | ORAL | Status: AC
  Filled 2019-11-22: qty 1

## 2019-11-22 MED ORDER — CEFAZOLIN SODIUM-DEXTROSE 2-4 GM/100ML-% IV SOLN
2.0000 g | INTRAVENOUS | Status: AC
  Administered 2019-11-22: 2 g via INTRAVENOUS

## 2019-11-22 MED ORDER — MELOXICAM 15 MG PO TABS
15.0000 mg | ORAL_TABLET | Freq: Every day | ORAL | 3 refills | Status: DC
Start: 2019-11-22 — End: 2020-03-24

## 2019-11-22 MED ORDER — PROPOFOL 10 MG/ML IV BOLUS
INTRAVENOUS | Status: DC | PRN
  Administered 2019-11-22: 50 mg via INTRAVENOUS

## 2019-11-22 MED ORDER — MELOXICAM 7.5 MG PO TABS
ORAL_TABLET | ORAL | Status: AC
  Administered 2019-11-22: 15 mg via ORAL
  Filled 2019-11-22: qty 2

## 2019-11-22 MED ORDER — MIDAZOLAM HCL 2 MG/2ML IJ SOLN
INTRAMUSCULAR | Status: AC
  Filled 2019-11-22: qty 2

## 2019-11-22 MED ORDER — PROPOFOL 10 MG/ML IV BOLUS
INTRAVENOUS | Status: AC
  Filled 2019-11-22: qty 20

## 2019-11-22 MED ORDER — ORAL CARE MOUTH RINSE: 15.0000 mL | Freq: Once | OROMUCOSAL | Status: AC

## 2019-11-22 MED ORDER — HYDROCODONE-ACETAMINOPHEN 5-325 MG PO TABS
1.0000 | ORAL_TABLET | Freq: Once | ORAL | Status: AC
  Administered 2019-11-22: 1 via ORAL

## 2019-11-22 MED ORDER — HYDRALAZINE HCL 20 MG/ML IJ SOLN
10.0000 mg | Freq: Once | INTRAMUSCULAR | Status: AC
  Administered 2019-11-22: 10 mg via INTRAVENOUS

## 2019-11-22 MED ORDER — CEFAZOLIN SODIUM-DEXTROSE 2-4 GM/100ML-% IV SOLN
INTRAVENOUS | Status: AC
  Filled 2019-11-22: qty 100

## 2019-11-22 MED ORDER — HYDROCODONE-ACETAMINOPHEN 5-325 MG PO TABS: 1.0000 | ORAL_TABLET | Freq: Four times a day (QID) | ORAL | 0 refills | Status: DC | PRN

## 2019-11-22 MED ORDER — BUPIVACAINE HCL 0.5 % IJ SOLN
INTRAMUSCULAR | Status: DC | PRN
  Administered 2019-11-22: 17 mL

## 2019-11-22 MED ORDER — CHLORHEXIDINE GLUCONATE 0.12 % MT SOLN
OROMUCOSAL | Status: AC
  Administered 2019-11-22: 15 mL via OROMUCOSAL
  Filled 2019-11-22: qty 15

## 2019-11-22 SURGICAL SUPPLY — 28 items
BLADE SURG MINI STRL (BLADE) ×3 IMPLANT
BNDG ESMARK 4X12 TAN STRL LF (GAUZE/BANDAGES/DRESSINGS) ×3 IMPLANT
CANISTER SUCT 1200ML W/VALVE (MISCELLANEOUS) ×3 IMPLANT
CHLORAPREP W/TINT 26 (MISCELLANEOUS) ×3 IMPLANT
COVER WAND RF STERILE (DRAPES) ×3 IMPLANT
CUFF TOURN SGL QUICK 18X4 (TOURNIQUET CUFF) ×3 IMPLANT
DRSG GAUZE FLUFF 36X18 (GAUZE/BANDAGES/DRESSINGS) ×6 IMPLANT
ELECT REM PT RETURN 9FT ADLT (ELECTROSURGICAL) ×3
ELECTRODE REM PT RTRN 9FT ADLT (ELECTROSURGICAL) ×1 IMPLANT
GAUZE XEROFORM 1X8 LF (GAUZE/BANDAGES/DRESSINGS) ×3 IMPLANT
GLOVE BIO SURGEON STRL SZ8 (GLOVE) ×3 IMPLANT
GOWN STRL REUS W/ TWL LRG LVL3 (GOWN DISPOSABLE) ×1 IMPLANT
GOWN STRL REUS W/TWL LRG LVL3 (GOWN DISPOSABLE) ×2
GOWN STRL REUS W/TWL LRG LVL4 (GOWN DISPOSABLE) ×3 IMPLANT
KIT TURNOVER KIT A (KITS) ×3 IMPLANT
NS IRRIG 500ML POUR BTL (IV SOLUTION) ×3 IMPLANT
PACK EXTREMITY (MISCELLANEOUS) ×3 IMPLANT
PAD PREP 24X41 OB/GYN DISP (PERSONAL CARE ITEMS) ×3 IMPLANT
PADDING CAST 4IN STRL (MISCELLANEOUS) ×2
PADDING CAST BLEND 4X4 STRL (MISCELLANEOUS) ×1 IMPLANT
SPLINT CAST 1 STEP 3X12 (MISCELLANEOUS) ×3 IMPLANT
STOCKINETTE BIAS CUT 4 980044 (GAUZE/BANDAGES/DRESSINGS) ×3 IMPLANT
STOCKINETTE STRL 4IN 9604848 (GAUZE/BANDAGES/DRESSINGS) ×3 IMPLANT
STOCKINETTE STRL 6IN 960660 (GAUZE/BANDAGES/DRESSINGS) ×3 IMPLANT
SUT ETHILON 4-0 (SUTURE)
SUT ETHILON 4-0 FS2 18XMFL BLK (SUTURE)
SUT ETHILON 5-0 FS-2 18 BLK (SUTURE) ×3 IMPLANT
SUTURE ETHLN 4-0 FS2 18XMF BLK (SUTURE) IMPLANT

## 2019-11-22 NOTE — Anesthesia Postprocedure Evaluation (Signed)
Anesthesia Post Note  Patient: [name]  Procedure(s) Performed: CARPAL TUNNEL RELEASE (Right )  Patient location during evaluation: PACU Anesthesia Type: General Level of consciousness: awake and alert Pain management: pain level controlled Vital Signs Assessment: post-procedure vital signs reviewed and stable Respiratory status: spontaneous breathing and respiratory function stable Cardiovascular status: stable Anesthetic complications: no   No complications documented.   Last Vitals:  Vitals:   11/22/19 0851 11/22/19 1133  BP: (!) 158/113 (!) 205/108  Pulse: 70 (!) 58  Resp: 18 14  Temp: 36.6 C (!) 36.1 C  SpO2: 100% 99%    Last Pain:  Vitals:   11/22/19 1133  TempSrc:   PainSc: Asleep                 Kayra Crowell K

## 2019-11-22 NOTE — Anesthesia Procedure Notes (Signed)
Procedure Name: LMA Insertion Date/Time: 11/22/2019 10:48 AM Performed by: Willette Alma, CRNA Pre-anesthesia Checklist: Patient identified, Patient being monitored, Timeout performed, Emergency Drugs available and Suction available Patient Re-evaluated:Patient Re-evaluated prior to induction Oxygen Delivery Method: Circle system utilized Preoxygenation: Pre-oxygenation with 100% oxygen Induction Type: IV induction Ventilation: Mask ventilation without difficulty LMA: LMA inserted LMA Size: 5.0 Tube type: Oral Number of attempts: 1 Placement Confirmation: positive ETCO2 and breath sounds checked- equal and bilateral Tube secured with: Tape Dental Injury: Teeth and Oropharynx as per pre-operative assessment

## 2019-11-22 NOTE — Op Note (Signed)
11/22/2019  11:26 AM  PATIENT:  [name]    PRE-OPERATIVE DIAGNOSIS: RIGHT CARPAL TUNNEL SYNDROME POST-OPERATIVE DIAGNOSIS: RIGHT CARPAL TUNNEL SYNDROME  PROCEDURE:  RIGHT CARPAL TUNNEL RELEASE  SURGEON: Park Breed, MD    ANESTHESIA:   General  TOURNIQUET TIME: 41   MIN  PREOPERATIVE INDICATIONS:  [name] is a  64 y.o. female with a diagnosis of right carpal tunnel syndrome who failed conservative measures and elected for surgical management.    The risks benefits and alternatives were discussed with the patient preoperatively including but not limited to the risks of infection, bleeding, nerve injury, incomplete relief of symptoms, pillar pain, cardiopulmonary complications, the need for revision surgery, among others, and the patient was willing to proceed.  OPERATIVE FINDINGS: Thickened volar ligament and nerve compression.  OPERATIVE PROCEDURE: The patient is brought to the operating room placed in the supine position. General anesthesia was administered. The right upper extremity was prepped and draped in usual sterile fashion. Time out was performed. The arm was elevated and exsanguinated and the tourniquet was inflated. Incision was made in line with the radial border of the ring finger. The carpal tunnel transverse fascia was identified, cleaned, and incised sharply. The common sensory branches were visualized along with the superficial palmar arch and protected.  The median nerve was protected below  A Kelly clamp was  placed underneath the transverse carpal ligament, protecting the nerve. I released the ligament completely, and then released the proximal distal volar forearm fascia. The nerve was identified, and visualized, and protected throughout the case. The motor branch was intact upon inspection.  No masses or abnormalities were identified in ulnar bursa.  The wounds were irrigated copiously, and the skin closed with nylon. The wound was injected with 1/2%  marcaine followed by a sterile dressing and  volar splint .  Tourniquet was deflated with good return of blood flow to all fingers. Sponge and needle counts were correct.  The patient tolerated this well, with no complications. The patient was awakened and taken to recovery in good condition.

## 2019-11-22 NOTE — H&P (Signed)
THE PATIENT WAS SEEN PRIOR TO SURGERY TODAY.  HISTORY, ALLERGIES, HOME MEDICATIONS AND OPERATIVE PROCEDURE WERE REVIEWED. RISKS AND BENEFITS OF SURGERY DISCUSSED WITH PATIENT AGAIN.  NO CHANGES FROM INITIAL HISTORY AND PHYSICAL NOTED.    

## 2019-11-22 NOTE — Discharge Instructions (Signed)

## 2019-11-22 NOTE — Anesthesia Preprocedure Evaluation (Addendum)
Anesthesia Evaluation  Patient identified by MRN, date of birth, ID band Patient awake    Reviewed: Allergy & Precautions, NPO status , Patient's Chart, lab work & pertinent test results  History of Anesthesia Complications Negative for: history of anesthetic complications  Airway Mallampati: II       Dental   Pulmonary neg sleep apnea, neg COPD, Not current smoker,           Cardiovascular (-) hypertension(-) Past MI and (-) CHF (-) dysrhythmias (-) Valvular Problems/Murmurs     Neuro/Psych neg Seizures Anxiety    GI/Hepatic GERD  Medicated,(+)     substance abuse  marijuana use,   Endo/Other  neg diabetes  Renal/GU negative Renal ROS     Musculoskeletal   Abdominal   Peds  Hematology   Anesthesia Other Findings   Reproductive/Obstetrics                            Anesthesia Physical Anesthesia Plan  ASA: II  Anesthesia Plan: General   Post-op Pain Management:    Induction: Intravenous  PONV Risk Score and Plan: 3 and Ondansetron  Airway Management Planned: LMA  Additional Equipment:   Intra-op Plan:   Post-operative Plan:   Informed Consent: I have reviewed the patients History and Physical, chart, labs and discussed the procedure including the risks, benefits and alternatives for the proposed anesthesia with the patient or authorized representative who has indicated his/her understanding and acceptance.       Plan Discussed with:   Anesthesia Plan Comments:        Anesthesia Quick Evaluation

## 2019-11-22 NOTE — Transfer of Care (Signed)
Immediate Anesthesia Transfer of Care Note  Patient: [name]  Procedure(s) Performed: CARPAL TUNNEL RELEASE (Right )  Patient Location: PACU  Anesthesia Type:General  Level of Consciousness: awake, alert  and oriented  Airway & Oxygen Therapy: Patient Spontanous Breathing and Patient connected to face mask oxygen  Post-op Assessment: Report given to RN and Post -op Vital signs reviewed and stable  Post vital signs: Reviewed and stable  Last Vitals:  Vitals Value Taken Time  BP 205/108 11/22/19 1132  Temp    Pulse 67 11/22/19 1134  Resp 16 11/22/19 1134  SpO2 98 % 11/22/19 1134  Vitals shown include unvalidated device data.  Last Pain:  Vitals:   11/22/19 0851  TempSrc: Oral  PainSc: 0-No pain         Complications: No complications documented.

## 2019-11-23 ENCOUNTER — Encounter: Payer: Self-pay | Admitting: Specialist

## 2019-11-25 ENCOUNTER — Telehealth: Payer: Self-pay

## 2019-11-25 NOTE — Telephone Encounter (Signed)
LEFT MESSAGE REGARDING MISSED APPT 11/16/19

## 2019-12-28 ENCOUNTER — Other Ambulatory Visit: Payer: Self-pay | Admitting: Student

## 2019-12-28 DIAGNOSIS — R110 Nausea: Secondary | ICD-10-CM

## 2020-01-31 ENCOUNTER — Telehealth: Payer: Self-pay

## 2020-01-31 NOTE — Telephone Encounter (Signed)
Authorization approved for Amphetamine-Dextroamphetamine 5mg  tablets from 01/31/2020 through 08/30/2022SL

## 2020-02-28 ENCOUNTER — Telehealth: Payer: Self-pay

## 2020-02-28 ENCOUNTER — Other Ambulatory Visit: Payer: Self-pay

## 2020-02-28 DIAGNOSIS — F411 Generalized anxiety disorder: Secondary | ICD-10-CM

## 2020-02-28 NOTE — Telephone Encounter (Signed)
lmom pt need appt for further refills

## 2020-03-01 ENCOUNTER — Other Ambulatory Visit: Payer: Self-pay

## 2020-03-01 DIAGNOSIS — F411 Generalized anxiety disorder: Secondary | ICD-10-CM

## 2020-03-01 MED ORDER — ESCITALOPRAM OXALATE 20 MG PO TABS: 20.0000 mg | ORAL_TABLET | Freq: Every day | ORAL | 0 refills | Status: DC

## 2020-03-24 ENCOUNTER — Other Ambulatory Visit: Payer: Self-pay

## 2020-03-24 ENCOUNTER — Encounter: Payer: Self-pay | Admitting: Nurse Practitioner

## 2020-03-24 ENCOUNTER — Ambulatory Visit (INDEPENDENT_AMBULATORY_CARE_PROVIDER_SITE_OTHER): Payer: Managed Care, Other (non HMO) | Admitting: Nurse Practitioner

## 2020-03-24 VITALS — BP 136/93 | HR 82 | Temp 97.5°F | Resp 16 | Ht 62.0 in | Wt 177.8 lb

## 2020-03-24 DIAGNOSIS — Z23 Encounter for immunization: Secondary | ICD-10-CM | POA: Diagnosis not present

## 2020-03-24 DIAGNOSIS — E782 Mixed hyperlipidemia: Secondary | ICD-10-CM

## 2020-03-24 DIAGNOSIS — R41840 Attention and concentration deficit: Secondary | ICD-10-CM

## 2020-03-24 DIAGNOSIS — F411 Generalized anxiety disorder: Secondary | ICD-10-CM

## 2020-03-24 DIAGNOSIS — Z6832 Body mass index (BMI) 32.0-32.9, adult: Secondary | ICD-10-CM | POA: Diagnosis not present

## 2020-03-24 DIAGNOSIS — M545 Low back pain, unspecified: Secondary | ICD-10-CM | POA: Diagnosis not present

## 2020-03-24 DIAGNOSIS — Z0001 Encounter for general adult medical examination with abnormal findings: Secondary | ICD-10-CM

## 2020-03-24 DIAGNOSIS — R300 Dysuria: Secondary | ICD-10-CM

## 2020-03-24 MED ORDER — ESCITALOPRAM OXALATE 20 MG PO TABS: 20.0000 mg | ORAL_TABLET | Freq: Every day | ORAL | 1 refills | Status: DC

## 2020-03-24 MED ORDER — MELOXICAM 15 MG PO TABS: 15.0000 mg | ORAL_TABLET | Freq: Every day | ORAL | 1 refills | Status: DC

## 2020-03-24 MED ORDER — AMPHETAMINE-DEXTROAMPHETAMINE 5 MG PO TABS: 5.0000 mg | ORAL_TABLET | Freq: Two times a day (BID) | ORAL | 0 refills | Status: DC

## 2020-03-24 NOTE — Progress Notes (Signed)
[facility] [place], [place] [address]  Internal MEDICINE  Office Visit Note  Patient Name: [name]  [id number]  [id number]  Date of Service: 03/24/2020   Pt is here for routine health maintenance examination  Chief Complaint  Patient presents with   Annual Exam   Gastroesophageal Reflux   Hyperlipidemia   Quality Metric Gaps    flu,tetnaus   controlled substance form    reviewed with PT   Eye Drainage    crusty more in the morning     The patient is here for health maintenance exam. She has brought biometric screening form to be completed and returned to her so she can avoid an increase in her insurance premiums due to BMI higher than desired. Today, her BMI is 32.52 and the desired BMI is 29.9. the patient will limit her calorie intake to 1200-1500 calories per day and gradually incorporate exercise into her daily routine. She will check in routinely to monitor progress.  The patient had routine, fasting labs done, however, she would like to recheck fasting labs. A new order slip given today. She had screening mammogram 06/07/2019 and it was negative.  The patient does take a low dose of adderall at 5mg  up to twice daily if needed to help with focus and concentration. Today, she was given Conner's self assessment for adult onset attention deficit disorder. She did score 4/6 indicating she has strong tendency for attention deficit disorder. She does have generalized anxiety disorder. She takes lexapro every day and continues to see a Social worker. She needs to have refills for this today. She would also like to  Get her flu shot today.     Current Medication: Outpatient Encounter Medications as of 03/24/2020  Medication Sig   amphetamine-dextroamphetamine (ADDERALL) 5 MG tablet Take 1 tablet (5 mg total) by mouth 2 (two) times daily with a meal.   atorvastatin (LIPITOR) 40 MG tablet Take 1 tablet (40 mg total) by mouth daily at 6 PM.    busPIRone (BUSPAR) 5 MG tablet Take 1 tablet po BID prn anxiety (Patient taking differently: Take 5 mg by mouth 2 (two) times daily as needed (anxiety). )   clonazePAM (KLONOPIN) 0.5 MG tablet Take 1 tablet (0.5 mg total) by mouth 2 (two) times daily as needed for anxiety.   escitalopram (LEXAPRO) 20 MG tablet Take 1 tablet (20 mg total) by mouth daily.   fenofibrate 54 MG tablet Take 1 tablet (54 mg total) by mouth daily.   fluticasone (FLONASE) 50 MCG/ACT nasal spray Place 2 sprays into both nostrils daily.   gabapentin (NEURONTIN) 400 MG capsule Take 1 capsule (400 mg total) by mouth 3 (three) times daily.   HYDROcodone-acetaminophen (NORCO) 5-325 MG tablet Take 1-2 tablets by mouth every 6 (six) hours as needed.   meloxicam (MOBIC) 15 MG tablet Take 1 tablet (15 mg total) by mouth at bedtime.   omeprazole (PRILOSEC) 40 MG capsule Take 40 mg by mouth every morning.   [DISCONTINUED] amphetamine-dextroamphetamine (ADDERALL) 5 MG tablet Take 1 tablet (5 mg total) by mouth 2 (two) times daily with a meal. (Patient taking differently: Take 5 mg by mouth daily. )   [DISCONTINUED] escitalopram (LEXAPRO) 20 MG tablet Take 1 tablet (20 mg total) by mouth daily.   [DISCONTINUED] meloxicam (MOBIC) 15 MG tablet Take 15 mg by mouth at bedtime.    [DISCONTINUED] meloxicam (MOBIC) 15 MG tablet Take 1 tablet (15 mg total) by mouth daily.   No facility-administered  encounter medications on file as of 03/24/2020.    Surgical History: Past Surgical History:  Procedure Laterality Date   CARPAL TUNNEL RELEASE Right 11/22/2019   Procedure: CARPAL TUNNEL RELEASE;  Surgeon: Earnestine Leys, MD;  Location: [facility] ORS;  Service: Orthopedics;  Laterality: Right;   COLONOSCOPY     TOE SURGERY  2005,2011   ingrown toenails in doctors office   UPPER GI ENDOSCOPY      Medical History: Past Medical History:  Diagnosis Date   ADHD    Allergy    Anxiety    Colon polyp    Diverticulosis    GERD  (gastroesophageal reflux disease)    Hyperlipidemia     Family History: Family History  Problem Relation Age of Onset   Lung cancer Mother    Heart disease Father    Pancreatic cancer Sister       Review of Systems  Constitutional: Negative for activity change, chills, fatigue and unexpected weight change.  HENT: Negative for congestion, postnasal drip, rhinorrhea, sneezing and sore throat.   Respiratory: Negative for cough, chest tightness, shortness of breath and wheezing.   Cardiovascular: Negative for chest pain and palpitations.  Gastrointestinal: Negative for abdominal pain, constipation, diarrhea, nausea and vomiting.  Endocrine: Negative for cold intolerance, heat intolerance, polydipsia and polyuria.  Genitourinary: Negative for dysuria, frequency and urgency.  Musculoskeletal: Positive for back pain. Negative for arthralgias, joint swelling and neck pain.       Chronic low back pain with muscle aches.   Skin: Negative for rash.  Allergic/Immunologic: Negative for environmental allergies.  Neurological: Negative for dizziness, tremors, numbness and headaches.  Hematological: Negative for adenopathy. Does not bruise/bleed easily.  Psychiatric/Behavioral: Positive for behavioral problems (Depression) and decreased concentration. Negative for sleep disturbance and suicidal ideas. The patient is nervous/anxious.        Well managed with current medication.     Today's Vitals   03/24/20 0834  BP: (!) 136/93  Pulse: 82  Resp: 16  Temp: (!) 97.5 F (36.4 C)  SpO2: 99%  Weight: 177 lb 12.8 oz (80.6 kg)  Height: 5\' 2"  (1.575 m)   Body mass index is 32.52 kg/m.  Physical Exam Vitals and nursing note reviewed.  Constitutional:      General: She is not in acute distress.    Appearance: Normal appearance. She is well-developed. She is obese. She is not diaphoretic.  HENT:     Head: Normocephalic and atraumatic.     Nose: Nose normal.     Mouth/Throat:      Pharynx: No oropharyngeal exudate.  Eyes:     Pupils: Pupils are equal, round, and reactive to light.  Neck:     Thyroid: No thyromegaly.     Vascular: No carotid bruit or JVD.     Trachea: No tracheal deviation.  Cardiovascular:     Rate and Rhythm: Normal rate and regular rhythm.     Pulses: Normal pulses.     Heart sounds: Normal heart sounds. No murmur heard.  No friction rub. No gallop.   Pulmonary:     Effort: Pulmonary effort is normal. No respiratory distress.     Breath sounds: Normal breath sounds. No wheezing or rales.  Chest:     Chest wall: No tenderness.  Abdominal:     General: Bowel sounds are normal.     Palpations: Abdomen is soft.     Tenderness: There is no abdominal tenderness.  Musculoskeletal:        General:  Normal range of motion.     Cervical back: Normal range of motion and neck supple.  Lymphadenopathy:     Cervical: No cervical adenopathy.  Skin:    General: Skin is warm and dry.  Neurological:     General: No focal deficit present.     Mental Status: She is alert and oriented to person, place, and time.     Cranial Nerves: No cranial nerve deficit.  Psychiatric:        Mood and Affect: Mood normal.        Behavior: Behavior normal.        Thought Content: Thought content normal.        Judgment: Judgment normal.     Comments: Patient scored 4/6 on Conner's self assessment for adult attention deficit disorder.     Assessment/Plan: 1. Encounter for general adult medical examination with abnormal findings Annual health maintenance exam today. Order slip given to check routine, fasting labs.   2. Mixed hyperlipidemia Check fasting lipid panel and adjust cholesterol lowering medications as indicated   3. Low back pain at multiple sites May take meloxicam 15mg  daily as needed.  - meloxicam (MOBIC) 15 MG tablet; Take 1 tablet (15 mg total) by mouth at bedtime.  Dispense: 90 tablet; Refill: 1  4. BMI 32.0-32.9,adult Biometric exemption form  completed and returned to patient during visit. She will limit claories to 1200-1500 calories per day and gradually incorporate exercise into her daily routine.   5. GAD (generalized anxiety disorder) Stable. Continue lexapro 20mg  daily.  - escitalopram (LEXAPRO) 20 MG tablet; Take 1 tablet (20 mg total) by mouth daily.  Dispense: 90 tablet; Refill: 1  6. Attention and concentration deficit Scored 4/6 on Conner's self-assessment today. May continue to take adderall 5mg  tablets up to twice daily as needed for focus and concentration. A single 30 day prescription sent to her pharmacy.  - amphetamine-dextroamphetamine (ADDERALL) 5 MG tablet; Take 1 tablet (5 mg total) by mouth 2 (two) times daily with a meal.  Dispense: 60 tablet; Refill: 0  7. Dysuria - UA/M w/rflx Culture, Routine  8. Needs flu shot Flu vaccine administered in the office today.  - Flu Vaccine MDCK QUAD PF  General Counseling: Retha verbalizes understanding of the findings of todays visit and agrees with plan of treatment. I have discussed any further diagnostic evaluation that may be needed or ordered today. We also reviewed her medications today. she has been encouraged to call the office with any questions or concerns that should arise related to todays visit.    Counseling:  Obesity Counseling: Risk Assessment: An assessment of behavioral risk factors was made today and includes lack of exercise sedentary lifestyle, lack of portion control and poor dietary habits.  Risk Modification Advice: She was counseled on portion control guidelines. Restricting daily caloric intake to 1200-1500. The detrimental long term effects of obesity on her health and ongoing poor compliance was also discussed with the patient.  This patient was seen by Leretha Pol FNP Collaboration with Dr Lavera Guise as a part of collaborative care agreement  Orders Placed This Encounter  Procedures   Flu Vaccine MDCK QUAD PF   UA/M w/rflx  Culture, Routine    Meds ordered this encounter  Medications   amphetamine-dextroamphetamine (ADDERALL) 5 MG tablet    Sig: Take 1 tablet (5 mg total) by mouth 2 (two) times daily with a meal.    Dispense:  60 tablet    Refill:  0  Order Specific Question:   Supervising Provider    Answer:   Lavera Guise [[id number]]   escitalopram (LEXAPRO) 20 MG tablet    Sig: Take 1 tablet (20 mg total) by mouth daily.    Dispense:  90 tablet    Refill:  1    Pt needs follow up before anymore refills    Order Specific Question:   Supervising Provider    Answer:   Lavera Guise [[id number]]   meloxicam (MOBIC) 15 MG tablet    Sig: Take 1 tablet (15 mg total) by mouth at bedtime.    Dispense:  90 tablet    Refill:  1    Order Specific Question:   Supervising Provider    Answer:   Lavera Guise [[id number]]    Total time spent: 49 Minutes  Time spent includes review of chart, medications, test results, and follow up plan with the patient.     Lavera Guise, MD  Internal Medicine

## 2020-03-25 LAB — UA/M W/RFLX CULTURE, ROUTINE
Bilirubin, UA: NEGATIVE
Glucose, UA: NEGATIVE
Ketones, UA: NEGATIVE
Leukocytes,UA: NEGATIVE
Nitrite, UA: NEGATIVE
Protein,UA: NEGATIVE
RBC, UA: NEGATIVE
Specific Gravity, UA: 1.019 (ref 1.005–1.030)
Urobilinogen, Ur: 1 mg/dL (ref 0.2–1.0)
pH, UA: 6 (ref 5.0–7.5)

## 2020-03-25 LAB — MICROSCOPIC EXAMINATION
Bacteria, UA: NONE SEEN
Casts: NONE SEEN /lpf
Epithelial Cells (non renal): NONE SEEN /hpf (ref 0–10)
WBC, UA: NONE SEEN /hpf (ref 0–5)

## 2020-03-28 ENCOUNTER — Other Ambulatory Visit: Payer: Self-pay

## 2020-04-03 ENCOUNTER — Other Ambulatory Visit: Payer: Self-pay | Admitting: Nurse Practitioner

## 2020-04-03 DIAGNOSIS — F411 Generalized anxiety disorder: Secondary | ICD-10-CM

## 2020-04-03 MED ORDER — CLONAZEPAM 0.5 MG PO TABS: 0.5000 mg | ORAL_TABLET | Freq: Every evening | ORAL | 1 refills | Status: DC | PRN

## 2020-05-05 ENCOUNTER — Other Ambulatory Visit: Payer: Self-pay | Admitting: Internal Medicine

## 2020-05-05 ENCOUNTER — Ambulatory Visit: Payer: Managed Care, Other (non HMO) | Attending: Internal Medicine

## 2020-05-05 DIAGNOSIS — Z23 Encounter for immunization: Secondary | ICD-10-CM

## 2020-05-05 NOTE — Progress Notes (Signed)
   Covid-19 Vaccination Clinic  Name:  [name]    MRN: [id number] DOB: Apr 05, 1956  05/05/2020  [name] was observed post Covid-19 immunization for 15 minutes without incident. She was provided with Vaccine Information Sheet and instruction to access the V-Safe system.   [name] was instructed to call 911 with any severe reactions post vaccine: Marland Kitchen Difficulty breathing  . Swelling of face and throat  . A fast heartbeat  . A bad rash all over body  . Dizziness and weakness   Immunizations Administered    Name Date Dose VIS Date Route   Pfizer COVID-19 Vaccine 05/05/2020  2:02 PM 0.3 mL 03/22/2020 Intramuscular   Manufacturer: [place]   Lot: [id number]   Faith: [id number]

## 2020-05-25 ENCOUNTER — Encounter: Payer: Self-pay | Admitting: Nurse Practitioner

## 2020-05-25 ENCOUNTER — Other Ambulatory Visit: Payer: Self-pay

## 2020-05-25 ENCOUNTER — Ambulatory Visit: Payer: Managed Care, Other (non HMO) | Admitting: Nurse Practitioner

## 2020-05-25 VITALS — BP 152/86 | HR 79 | Temp 97.4°F | Resp 16 | Ht 62.0 in | Wt 178.2 lb

## 2020-05-25 DIAGNOSIS — F411 Generalized anxiety disorder: Secondary | ICD-10-CM

## 2020-05-25 DIAGNOSIS — R41840 Attention and concentration deficit: Secondary | ICD-10-CM

## 2020-05-25 DIAGNOSIS — Z79899 Other long term (current) drug therapy: Secondary | ICD-10-CM | POA: Diagnosis not present

## 2020-05-25 DIAGNOSIS — G4719 Other hypersomnia: Secondary | ICD-10-CM | POA: Diagnosis not present

## 2020-05-25 DIAGNOSIS — E782 Mixed hyperlipidemia: Secondary | ICD-10-CM | POA: Diagnosis not present

## 2020-05-25 DIAGNOSIS — R0683 Snoring: Secondary | ICD-10-CM

## 2020-05-25 DIAGNOSIS — K219 Gastro-esophageal reflux disease without esophagitis: Secondary | ICD-10-CM

## 2020-05-25 LAB — POCT URINE DRUG SCREEN
Methylenedioxyamphetamine: NOT DETECTED
POC Amphetamine UR: NOT DETECTED
POC BENZODIAZEPINES UR: NOT DETECTED
POC Barbiturate UR: NOT DETECTED
POC Cocaine UR: NOT DETECTED
POC Ecstasy UR: NOT DETECTED
POC Marijuana UR: POSITIVE — AB
POC Methadone UR: NOT DETECTED
POC Methamphetamine UR: NOT DETECTED
POC Opiate Ur: NOT DETECTED
POC Oxycodone UR: NOT DETECTED
POC PHENCYCLIDINE UR: NOT DETECTED
POC TRICYCLICS UR: NOT DETECTED

## 2020-05-25 MED ORDER — OMEPRAZOLE 40 MG PO CPDR: 40.0000 mg | DELAYED_RELEASE_CAPSULE | Freq: Every morning | ORAL | 1 refills | Status: DC

## 2020-05-25 MED ORDER — AMPHETAMINE-DEXTROAMPHETAMINE 5 MG PO TABS: 5.0000 mg | ORAL_TABLET | Freq: Two times a day (BID) | ORAL | 0 refills | Status: DC

## 2020-05-25 MED ORDER — OMEPRAZOLE 40 MG PO CPDR: 40.0000 mg | DELAYED_RELEASE_CAPSULE | Freq: Every morning | ORAL | 3 refills | Status: DC

## 2020-05-25 NOTE — Progress Notes (Signed)
[facility] [place], [place] [address]  Internal MEDICINE  Office Visit Note  Patient Name: [name]  [id number]  [id number]  Date of Service: 06/21/2020  Chief Complaint  Patient presents with  . Follow-up  . Gastroesophageal Reflux  . Hyperlipidemia  . controlled substance form    reviewed    The patient is here for follow up visit.  -believes she may have sleep apnea. Was told by friends and her husband that she snores very loudly. Sometimes she stops breathing. Feels very fatigued all the time.  -does take adderall 5mg  on as needed basis. Helps to keep her awake and focused while at work. Does not take everyday. Was given Conner's self assessment for adult onset attention deficit disorder on 03/24/2020. She did score 4/6 indicating she has strong tendency for attention deficit disorder.       Current Medication: Outpatient Encounter Medications as of 05/25/2020  Medication Sig  . atorvastatin (LIPITOR) 40 MG tablet Take 1 tablet (40 mg total) by mouth daily at 6 PM.  . busPIRone (BUSPAR) 5 MG tablet Take 1 tablet po BID prn anxiety (Patient taking differently: Take 5 mg by mouth 2 (two) times daily as needed (anxiety).)  . clonazePAM (KLONOPIN) 0.5 MG tablet Take 1 tablet (0.5 mg total) by mouth at bedtime as needed for anxiety.  Marland Kitchen escitalopram (LEXAPRO) 20 MG tablet Take 1 tablet (20 mg total) by mouth daily.  . fenofibrate 54 MG tablet Take 1 tablet (54 mg total) by mouth daily.  . fluticasone (FLONASE) 50 MCG/ACT nasal spray Place 2 sprays into both nostrils daily.  Marland Kitchen gabapentin (NEURONTIN) 400 MG capsule Take 1 capsule (400 mg total) by mouth 3 (three) times daily.  Marland Kitchen HYDROcodone-acetaminophen (NORCO) 5-325 MG tablet Take 1-2 tablets by mouth every 6 (six) hours as needed.  . meloxicam (MOBIC) 15 MG tablet Take 1 tablet (15 mg total) by mouth at bedtime.  . [DISCONTINUED] amphetamine-dextroamphetamine (ADDERALL) 5 MG tablet Take 1 tablet  (5 mg total) by mouth 2 (two) times daily with a meal.  . [DISCONTINUED] omeprazole (PRILOSEC) 40 MG capsule Take 40 mg by mouth every morning.  Marland Kitchen amphetamine-dextroamphetamine (ADDERALL) 5 MG tablet Take 1 tablet (5 mg total) by mouth 2 (two) times daily with a meal.  . omeprazole (PRILOSEC) 40 MG capsule Take 1 capsule (40 mg total) by mouth every morning.  . [DISCONTINUED] amphetamine-dextroamphetamine (ADDERALL) 5 MG tablet Take 1 tablet (5 mg total) by mouth 2 (two) times daily with a meal.  . [DISCONTINUED] omeprazole (PRILOSEC) 40 MG capsule Take 1 capsule (40 mg total) by mouth every morning.   No facility-administered encounter medications on file as of 05/25/2020.    Surgical History: Past Surgical History:  Procedure Laterality Date  . CARPAL TUNNEL RELEASE Right 11/22/2019   Procedure: CARPAL TUNNEL RELEASE;  Surgeon: Earnestine Leys, MD;  Location: [facility] ORS;  Service: Orthopedics;  Laterality: Right;  . COLONOSCOPY    . TOE SURGERY  2005,2011   ingrown toenails in doctors office  . UPPER GI ENDOSCOPY      Medical History: Past Medical History:  Diagnosis Date  . ADHD   . Allergy   . Anxiety   . Colon polyp   . Diverticulosis   . GERD (gastroesophageal reflux disease)   . Hyperlipidemia     Family History: Family History  Problem Relation Age of Onset  . Lung cancer Mother   . Heart disease Father   . Pancreatic cancer  Sister     Social History   Socioeconomic History  . Marital status: Married    Spouse name: Not on file  . Number of children: Not on file  . Years of education: Not on file  . Highest education level: Not on file  Occupational History  . Not on file  Tobacco Use  . Smoking status: Never Smoker  . Smokeless tobacco: Never Used  Vaping Use  . Vaping Use: Never used  Substance and Sexual Activity  . Alcohol use: Yes    Comment: ocassionally   . Drug use: Yes    Types: Marijuana    Comment: daily use  . Sexual activity: Not on file   Other Topics Concern  . Not on file  Social History Narrative  . Not on file   Social Determinants of Health   Financial Resource Strain: Not on file  Food Insecurity: Not on file  Transportation Needs: Not on file  Physical Activity: Not on file  Stress: Not on file  Social Connections: Not on file  Intimate Partner Violence: Not on file      Review of Systems  Constitutional: Positive for fatigue. Negative for activity change, chills and unexpected weight change.  HENT: Negative for congestion, postnasal drip, rhinorrhea, sneezing and sore throat.        Loud snoring reported by friends and husband.   Respiratory: Negative for cough, chest tightness, shortness of breath and wheezing.   Cardiovascular: Negative for chest pain and palpitations.  Gastrointestinal: Negative for abdominal pain, constipation, diarrhea, nausea and vomiting.  Endocrine: Negative for cold intolerance, heat intolerance, polydipsia and polyuria.  Musculoskeletal: Positive for back pain. Negative for arthralgias, joint swelling and neck pain.       Chronic low back pain with muscle aches.   Skin: Negative for rash.  Allergic/Immunologic: Negative for environmental allergies.  Neurological: Negative for dizziness, tremors, numbness and headaches.  Hematological: Negative for adenopathy. Does not bruise/bleed easily.  Psychiatric/Behavioral: Positive for behavioral problems (Depression) and decreased concentration. Negative for sleep disturbance and suicidal ideas. The patient is nervous/anxious.        Well managed with current medication. Was given Conner's self assessment for adult onset attention deficit disorder on 03/24/2020. She did score 4/6 indicating she has strong tendency for attention deficit disorder.    Vital Signs: BP (!) 152/86   Pulse 79   Temp (!) 97.4 F (36.3 C)   Resp 16   Ht 5\' 2"  (1.575 m)   Wt 178 lb 3.2 oz (80.8 kg)   SpO2 99%   BMI 32.59 kg/m    Physical Exam Vitals  and nursing note reviewed.  Constitutional:      General: She is not in acute distress.    Appearance: Normal appearance. She is well-developed and well-nourished. She is not diaphoretic.  HENT:     Head: Normocephalic and atraumatic.     Mouth/Throat:     Mouth: Oropharynx is clear and moist.     Pharynx: No oropharyngeal exudate.  Eyes:     Extraocular Movements: EOM normal.     Pupils: Pupils are equal, round, and reactive to light.  Neck:     Thyroid: No thyromegaly.     Vascular: No carotid bruit or JVD.     Trachea: No tracheal deviation.  Cardiovascular:     Rate and Rhythm: Normal rate and regular rhythm.     Heart sounds: Normal heart sounds. No murmur heard. No friction rub. No gallop.  Pulmonary:     Effort: Pulmonary effort is normal. No respiratory distress.     Breath sounds: Normal breath sounds. No wheezing or rales.  Chest:     Chest wall: No tenderness.  Abdominal:     Palpations: Abdomen is soft.  Musculoskeletal:        General: Normal range of motion.     Cervical back: Normal range of motion and neck supple.  Lymphadenopathy:     Cervical: No cervical adenopathy.  Skin:    General: Skin is warm and dry.  Neurological:     General: No focal deficit present.     Mental Status: She is alert and oriented to person, place, and time.     Cranial Nerves: No cranial nerve deficit.  Psychiatric:        Mood and Affect: Mood and affect and mood normal.        Behavior: Behavior normal.        Thought Content: Thought content normal.        Judgment: Judgment normal.    Assessment/Plan: 1. Gastroesophageal reflux disease without esophagitis conitnue omeprazole as prescribed  - omeprazole (PRILOSEC) 40 MG capsule; Take 1 capsule (40 mg total) by mouth every morning.  Dispense: 90 capsule; Refill: 3  2. Mixed hyperlipidemia Unable to tolerate statins. Does take fenofibrate every evening.   3. Excessive daytime sleepiness Will get baseline sleep study  for further evaluation.  - [facility] Sleep Study; Future  4. Loud snoring Will get baseline sleep study for further evaluation.  - [facility] Sleep Study; Future  5. Attention and concentration deficit May continue to take adderall 5mg  up to twice daily as needed for focus and concentration. Two 30 day prescriptions were sent to her pharmacy. Dates are 05/25/2020 and 06/23/2020 - amphetamine-dextroamphetamine (ADDERALL) 5 MG tablet; Take 1 tablet (5 mg total) by mouth 2 (two) times daily with a meal.  Dispense: 60 tablet; Refill: 0  6. GAD (generalized anxiety disorder) Continue escitalopram at current dose. No new prescription for clonazepam given as UDS positive for THC.   7. Encounter for long-term (current) use of medications - POCT Urine Drug Screen continues to be positive for THC. Discussed risks of mixing THC with medication such as clonazepam. No new prescription for clonazepam given today. Patient voiced understanding.   General Counseling: Syrina verbalizes understanding of the findings of todays visit and agrees with plan of treatment. I have discussed any further diagnostic evaluation that may be needed or ordered today. We also reviewed her medications today. she has been encouraged to call the office with any questions or concerns that should arise related to todays visit.  Patient has sign and symptoms of OSA ( disturbed sleep, excessive fatigue during the day, uncontrolled bp and abnormal BMI). Baseline sleep study is ordered to further look into this. Long term complications of OSA was addressed with the patient.  This patient was seen by Leretha Pol FNP Collaboration with Dr Lavera Guise as a part of collaborative care agreement  Orders Placed This Encounter  Procedures  . POCT Urine Drug Screen  . PSG Sleep Study    Meds ordered this encounter  Medications  . DISCONTD: omeprazole (PRILOSEC) 40 MG capsule    Sig: Take 1 capsule (40 mg total) by mouth every morning.     Dispense:  30 capsule    Refill:  1    Order Specific Question:   Supervising Provider    Answer:   Lavera Guise [  [id number]]  . omeprazole (PRILOSEC) 40 MG capsule    Sig: Take 1 capsule (40 mg total) by mouth every morning.    Dispense:  90 capsule    Refill:  3    Order Specific Question:   Supervising Provider    Answer:   Lavera Guise [id number]  . DISCONTD: amphetamine-dextroamphetamine (ADDERALL) 5 MG tablet    Sig: Take 1 tablet (5 mg total) by mouth 2 (two) times daily with a meal.    Dispense:  60 tablet    Refill:  0    Order Specific Question:   Supervising Provider    Answer:   Lavera Guise [place]  . amphetamine-dextroamphetamine (ADDERALL) 5 MG tablet    Sig: Take 1 tablet (5 mg total) by mouth 2 (two) times daily with a meal.    Dispense:  60 tablet    Refill:  0    Fill after 06/24/2019    Order Specific Question:   Supervising Provider    Answer:   Lavera Guise [id number]    Total time spent: 30 Minutes   Time spent includes review of chart, medications, test results, and follow up plan with the patient.      Dr Lavera Guise Internal medicine

## 2020-05-29 ENCOUNTER — Telehealth: Payer: Self-pay

## 2020-05-29 NOTE — Telephone Encounter (Signed)
All documents for [facility] signed and placed in [facility] folder behind Tat

## 2020-06-21 DIAGNOSIS — R0683 Snoring: Secondary | ICD-10-CM | POA: Insufficient documentation

## 2020-06-21 DIAGNOSIS — G4719 Other hypersomnia: Secondary | ICD-10-CM | POA: Insufficient documentation

## 2020-06-26 ENCOUNTER — Ambulatory Visit: Payer: Managed Care, Other (non HMO) | Admitting: Nurse Practitioner

## 2020-06-26 ENCOUNTER — Telehealth: Payer: Self-pay

## 2020-06-26 NOTE — Telephone Encounter (Signed)
Sleep study is scheduled for 06-27-20. LMOM to schedule Sleep study f/u

## 2020-06-27 ENCOUNTER — Encounter (INDEPENDENT_AMBULATORY_CARE_PROVIDER_SITE_OTHER): Payer: Managed Care, Other (non HMO) | Admitting: Internal Medicine

## 2020-06-27 ENCOUNTER — Telehealth: Payer: Self-pay

## 2020-06-27 DIAGNOSIS — G4733 Obstructive sleep apnea (adult) (pediatric): Secondary | ICD-10-CM | POA: Diagnosis not present

## 2020-06-27 NOTE — Telephone Encounter (Signed)
LMOM for pt to schedule her sleep study f/u. Sleep study is scheduled on 06/27/20

## 2020-06-28 ENCOUNTER — Telehealth: Payer: Self-pay

## 2020-06-28 NOTE — Procedures (Signed)
[facility] Report Part I                                                               Phone: [phone] Fax: [phone]  Patient Name: Sabrina, [name] Acquisition Number: [id number]  Date of Birth: April 23, 1956 Acquisition Date: 06/27/2020  Referring Physician: Theodoro Grist, NP     History: The patient is a 65 year old female who was referred for evaluation of possible sleep apnea. Medical History: ADHD, allergies, anxiety,  diverticulosis, GERD, hyperlipidemia.  Medications: Adderall, Lipitor, Buspar, Klonipin, Lexapro, fenofibrate, Flonase, Neurontin, Norco, Mobic, Prilosec.  Procedure: This home-based sleep study was performed on the [place] [facility] using the standard diagnostic protocol. This included 6 channels of EEG, 2 channels of EOG, chin EMG, bilateral anterior tibialis EMG, nasal/oral thermistor, PTAF (nasal pressure transducer), chest and abdominal wall movements, EKG, and pulse oximetry.  Description: The total recording time was 395.8 minutes. The total sleep time was 324.7 minutes. There were a total of 41.5 minutes of wakefulness after sleep onset for a?reduced?sleep efficiency of 82.0%. The latency to sleep onset was within normal limits at 29.6 minutes. The R sleep onset latency was??prolonged at 232.5 minutes. Sleep parameters, as a percentage of the total sleep time, demonstrated 5.9% of sleep was in N1 sleep, 73.4% N2, 7.4% N3 and 13.4% R sleep. There were a total of 27 arousals for an arousal index of 5.0 arousals per hour of sleep that was normal.???  Respiratory monitoring demonstrated intermittent mild to moderate degree of snoring in all positions. There were 113 apneas and hypopneas for an Apnea Hypopnea Index of 20.9 apneas and hypopneas per hour of sleep. The REM related apnea hypopnea index was 67.6/hr of REM sleep compared to a NREM AHI of 13.4/hr. The Respiratory Disturbance Index, which includes 2 respiratory effort  related arousals (RERAs), was 21.3 respiratory events per hour of sleep.  The average duration of the respiratory events was 26.0 seconds with a maximum duration of 68.5 seconds. The respiratory events occurred almost exclusively in the supine position with an AHI of 29.8. The respiratory events were associated with peripheral oxygen desaturations on the average to 86%. The lowest oxygen desaturation associated with a respiratory event was 79%. Additionally, the baseline oxygen saturation during wakefulness was 93%, during NREM sleep averaged 92%, and during REM sleep averaged 92%. The total duration of oxygen < 90% was 21.9 minutes and <80% was 0.1 minutes.  Cardiac monitoring- did not demonstrate transient cardiac decelerations associated with the apneas. There were no significant cardiac rhythm irregularities.   Periodic limb movement monitoring- did not demonstrate periodic limb movements.   Impression: ???This routine home-based sleep study demonstrated significant REM and position-dependent obstructive sleep apnea with an overall Apnea Hypopnea Index of 20.9 apneas and hypopneas per hour of sleep, which increased to 67.6 in REM and 29.8 in the supine position. As REM percentage was reduced, the findings likely underestimate the severity of the sleep apnea.  There was a reduced sleep efficiency with??increased awakenings?and reduced percentages of REM and slow wave sleep. These findings would appear to be due to the obstructive sleep apnea.???????  ??Recommendations:    1. A CPAP titration would be  recommended due to the severity of the sleep apnea. Some supine sleep should be ensured to optimize the titration. 2. Additionally, would recommend weight loss in a patient with a BMI of 32.0.     Allyne Gee, MD, [facility] Diplomate ABMS-Pulmonary, Critical Care and Sleep Medicine  Electronically reviewed and digitally signed

## 2020-06-28 NOTE — Telephone Encounter (Signed)
Called pt and left a message and asked pt to call back to verify follow up after sleep study with [facility] on 06/27/20. Lamoine Magallon

## 2020-07-07 ENCOUNTER — Encounter
Admission: RE | Admit: 2020-07-07 | Discharge: 2020-07-07 | Disposition: A | Discharge status: Home / Self Care | Payer: Managed Care, Other (non HMO) | Source: Ambulatory Visit | Attending: Student | Admitting: Student

## 2020-07-07 ENCOUNTER — Other Ambulatory Visit: Payer: Self-pay

## 2020-07-07 DIAGNOSIS — R110 Nausea: Secondary | ICD-10-CM

## 2020-07-07 MED ORDER — TECHNETIUM TC 99M SULFUR COLLOID
2.0000 | Freq: Once | INTRAVENOUS | Status: AC | PRN
  Administered 2020-07-07: 2.03 via ORAL

## 2020-07-10 ENCOUNTER — Telehealth: Payer: Self-pay

## 2020-07-10 ENCOUNTER — Ambulatory Visit: Payer: Managed Care, Other (non HMO) | Admitting: Internal Medicine

## 2020-07-10 ENCOUNTER — Encounter: Payer: Self-pay | Admitting: Internal Medicine

## 2020-07-10 VITALS — BP 120/86 | HR 72 | Temp 98.2°F | Resp 16 | Ht 62.0 in | Wt 175.6 lb

## 2020-07-10 DIAGNOSIS — F1210 Cannabis abuse, uncomplicated: Secondary | ICD-10-CM

## 2020-07-10 DIAGNOSIS — Z6832 Body mass index (BMI) 32.0-32.9, adult: Secondary | ICD-10-CM | POA: Diagnosis not present

## 2020-07-10 DIAGNOSIS — G4733 Obstructive sleep apnea (adult) (pediatric): Secondary | ICD-10-CM

## 2020-07-10 NOTE — Progress Notes (Signed)
[facility] [place], [place] [address]  Pulmonary Sleep Medicine   Office Visit Note  Patient Name: [name] DOB: Nov 28, 1955 MRN [id number]  Date of Service: 07/10/2020  Complaints/HPI: OSA MODERATE with an overall AHI of 21 per hour.  I discussed with her the pathophysiology of sleep apnea and the importance of compliance with CPAP therapy.  She will need to have a titration because of the severity of her obstructive sleep apnea and comorbid conditions.  In addition importance of weight loss was discussed.  Her BMI right now is 32.12  ROS  General: (-) fever, (-) chills, (-) night sweats, (-) weakness Skin: (-) rashes, (-) itching,. Eyes: (-) visual changes, (-) redness, (-) itching. Nose and Sinuses: (-) nasal stuffiness or itchiness, (-) postnasal drip, (-) nosebleeds, (-) sinus trouble. Mouth and Throat: (-) sore throat, (-) hoarseness. Neck: (-) swollen glands, (-) enlarged thyroid, (-) neck pain. Respiratory: + cough, (-) bloody sputum, + shortness of breath, - wheezing. Cardiovascular: - ankle swelling, (-) chest pain. Lymphatic: (-) lymph node enlargement. Neurologic: (-) numbness, (-) tingling. Psychiatric: (-) anxiety, (-) depression   Current Medication: Outpatient Encounter Medications as of 07/10/2020  Medication Sig  . amphetamine-dextroamphetamine (ADDERALL) 5 MG tablet Take 1 tablet (5 mg total) by mouth 2 (two) times daily with a meal.  . atorvastatin (LIPITOR) 40 MG tablet Take 1 tablet (40 mg total) by mouth daily at 6 PM.  . busPIRone (BUSPAR) 5 MG tablet Take 1 tablet po BID prn anxiety (Patient taking differently: Take 5 mg by mouth 2 (two) times daily as needed (anxiety).)  . clonazePAM (KLONOPIN) 0.5 MG tablet Take 1 tablet (0.5 mg total) by mouth at bedtime as needed for anxiety.  Marland Kitchen escitalopram (LEXAPRO) 20 MG tablet Take 1 tablet (20 mg total) by mouth daily.  . fenofibrate 54 MG tablet Take 1 tablet (54 mg total) by mouth  daily.  . fluticasone (FLONASE) 50 MCG/ACT nasal spray Place 2 sprays into both nostrils daily.  Marland Kitchen gabapentin (NEURONTIN) 400 MG capsule Take 1 capsule (400 mg total) by mouth 3 (three) times daily.  Marland Kitchen HYDROcodone-acetaminophen (NORCO) 5-325 MG tablet Take 1-2 tablets by mouth every 6 (six) hours as needed.  . meloxicam (MOBIC) 15 MG tablet Take 1 tablet (15 mg total) by mouth at bedtime.  Marland Kitchen omeprazole (PRILOSEC) 40 MG capsule Take 1 capsule (40 mg total) by mouth every morning.   No facility-administered encounter medications on file as of 07/10/2020.    Surgical History: Past Surgical History:  Procedure Laterality Date  . CARPAL TUNNEL RELEASE Right 11/22/2019   Procedure: CARPAL TUNNEL RELEASE;  Surgeon: Earnestine Leys, MD;  Location: [facility] ORS;  Service: Orthopedics;  Laterality: Right;  . COLONOSCOPY    . TOE SURGERY  2005,2011   ingrown toenails in doctors office  . UPPER GI ENDOSCOPY      Medical History: Past Medical History:  Diagnosis Date  . ADHD   . Allergy   . Anxiety   . Colon polyp   . Diverticulosis   . GERD (gastroesophageal reflux disease)   . Hyperlipidemia     Family History: Family History  Problem Relation Age of Onset  . Lung cancer Mother   . Heart disease Father   . Pancreatic cancer Sister     Social History: Social History   Socioeconomic History  . Marital status: Married    Spouse name: Not on file  . Number of children: Not on file  .  Years of education: Not on file  . Highest education level: Not on file  Occupational History  . Not on file  Tobacco Use  . Smoking status: Never Smoker  . Smokeless tobacco: Never Used  Vaping Use  . Vaping Use: Never used  Substance and Sexual Activity  . Alcohol use: Yes    Comment: ocassionally   . Drug use: Yes    Types: Marijuana    Comment: daily use  . Sexual activity: Not on file  Other Topics Concern  . Not on file  Social History Narrative  . Not on file   Social Determinants of  Health   Financial Resource Strain: Not on file  Food Insecurity: Not on file  Transportation Needs: Not on file  Physical Activity: Not on file  Stress: Not on file  Social Connections: Not on file  Intimate Partner Violence: Not on file    Vital Signs: Blood pressure 120/86, pulse 72, temperature 98.2 F (36.8 C), resp. rate 16, height 5\' 2"  (1.575 m), weight 175 lb 9.6 oz (79.7 kg), SpO2 98 %.  Examination: General Appearance: The patient is well-developed, well-nourished, and in no distress. Skin: Gross inspection of skin unremarkable. Head: normocephalic, no gross deformities. Eyes: no gross deformities noted. ENT: ears appear grossly normal no exudates. Neck: Supple. No thyromegaly. No LAD. Respiratory: no rhonchi noted at this time. Cardiovascular: Normal S1 and S2 without murmur or rub. Extremities: No cyanosis. pulses are equal. Neurologic: Alert and oriented. No involuntary movements.  LABS: Recent Results (from the past 2160 hour(s))  POCT Urine Drug Screen     Status: Abnormal   Collection Time: 05/25/20 11:59 AM  Result Value Ref Range   POC Methamphetamine UR None Detected None Detected   POC Opiate Ur None Detected None Detected   POC Barbiturate UR None Detected None Detected   POC Amphetamine UR None Detected None Detected   POC Oxycodone UR None Detected None Detected   POC Cocaine UR None Detected None Detected   POC Ecstasy UR None Detected None Detected   POC TRICYCLICS UR None Detected None Detected   POC PHENCYCLIDINE UR None Detected None Detected   POC Marijuana UR Positive (A) None Detected   POC Methadone UR None Detected None Detected   POC BENZODIAZEPINES UR None Detected None Detected   URINE TEMPERATURE     POC DRUG SCREEN OXIDANTS URINE     POC SPECIFIC GRAVITY URINE     POC PH URINE     Methylenedioxyamphetamine None Detected None Detected    Radiology: NM GASTRIC EMPTYING  Result Date: 07/07/2020 CLINICAL DATA:  Chronic nausea  since the 1990s off and on, worse in past year, some vomiting, nausea worse in morning EXAM: NUCLEAR MEDICINE GASTRIC EMPTYING SCAN TECHNIQUE: After oral ingestion of radiolabeled meal, sequential abdominal images were obtained for 4 hours. Percentage of activity emptying the stomach was calculated at 1 hour, 2 hour, 3 hour, and 4 hours. RADIOPHARMACEUTICALS:  2.03 mCi Tc-23m sulfur colloid in standardized meal COMPARISON:  None FINDINGS: Expected location of the stomach in the left upper quadrant. Ingested meal empties the stomach gradually over the course of the study. 37% emptied at 1 hr ( normal >= 10%) 71% emptied at 2 hr ( normal >= 40%) 86% emptied at 3 hr ( normal >= 70%) 95% emptied at 4 hr ( normal >= 90%) IMPRESSION: Normal gastric emptying study. Electronically Signed   By: Ulyses Southward M.D.   On: 07/07/2020 17:19  NM GASTRIC EMPTYING  Result Date: 07/07/2020 CLINICAL DATA:  Chronic nausea since the 1990s off and on, worse in past year, some vomiting, nausea worse in morning EXAM: NUCLEAR MEDICINE GASTRIC EMPTYING SCAN TECHNIQUE: After oral ingestion of radiolabeled meal, sequential abdominal images were obtained for 4 hours. Percentage of activity emptying the stomach was calculated at 1 hour, 2 hour, 3 hour, and 4 hours. RADIOPHARMACEUTICALS:  2.03 mCi Tc-17m sulfur colloid in standardized meal COMPARISON:  None FINDINGS: Expected location of the stomach in the left upper quadrant. Ingested meal empties the stomach gradually over the course of the study. 37% emptied at 1 hr ( normal >= 10%) 71% emptied at 2 hr ( normal >= 40%) 86% emptied at 3 hr ( normal >= 70%) 95% emptied at 4 hr ( normal >= 90%) IMPRESSION: Normal gastric emptying study. Electronically Signed   By: Lavonia Dana M.D.   On: 07/07/2020 17:19    NM GASTRIC EMPTYING  Result Date: 07/07/2020 CLINICAL DATA:  Chronic nausea since the 1990s off and on, worse in past year, some vomiting, nausea worse in morning EXAM: NUCLEAR MEDICINE  GASTRIC EMPTYING SCAN TECHNIQUE: After oral ingestion of radiolabeled meal, sequential abdominal images were obtained for 4 hours. Percentage of activity emptying the stomach was calculated at 1 hour, 2 hour, 3 hour, and 4 hours. RADIOPHARMACEUTICALS:  2.03 mCi Tc-64m sulfur colloid in standardized meal COMPARISON:  None FINDINGS: Expected location of the stomach in the left upper quadrant. Ingested meal empties the stomach gradually over the course of the study. 37% emptied at 1 hr ( normal >= 10%) 71% emptied at 2 hr ( normal >= 40%) 86% emptied at 3 hr ( normal >= 70%) 95% emptied at 4 hr ( normal >= 90%) IMPRESSION: Normal gastric emptying study. Electronically Signed   By: Lavonia Dana M.D.   On: 07/07/2020 17:19      Assessment and Plan: Patient Active Problem List   Diagnosis Date Noted  . Excessive daytime sleepiness 06/21/2020  . Loud snoring 06/21/2020  . BMI 32.0-32.9,adult 03/24/2020  . Needs flu shot 03/24/2020  . Vasomotor rhinitis 08/22/2019  . Encounter for general adult medical examination with abnormal findings 05/16/2019  . Encounter for long-term (current) use of medications 04/17/2019  . Screening for breast cancer 02/09/2018  . Routine cervical smear 02/09/2018  . Gastroesophageal reflux disease without esophagitis 11/12/2017  . Urinary tract infection with hematuria 07/30/2017  . Acute cystitis with hematuria 07/30/2017  . Low back pain at multiple sites 07/30/2017  . Dysuria 07/30/2017  . Mixed hyperlipidemia 07/30/2017  . Attention and concentration deficit 07/30/2017  . GAD (generalized anxiety disorder) 07/30/2017  . Other fatigue 07/30/2017      1. OSA (obstructive sleep apnea) MODERATE OSA noted on the sleep study needs to have CPAP titration  2. Obesity, morbid ([place]) Obesity Counseling: Had a lengthy discussion regarding patients BMI and weight issues. Patient was instructed on portion control as well as increased activity. Also discussed caloric  restrictions with trying to maintain intake less than 2000 Kcal. Discussions were made in accordance with the 5As of weight management. Simple actions such as not eating late and if able to, taking a walk is suggested.  3. BMI 32.0-32.9,adult Weight loss as above  4. Cannabis abuse without complication Counseled about prolonged use and dependency   General Counseling: I have discussed the findings of the evaluation and examination with Lucielle.  I have also discussed any further diagnostic evaluation thatmay be needed or ordered  today. Sabrea verbalizes understanding of the findings of todays visit. We also reviewed her medications today and discussed drug interactions and side effects including but not limited excessive drowsiness and altered mental states. We also discussed that there is always a risk not just to her but also people around her. she has been encouraged to call the office with any questions or concerns that should arise related to todays visit.  Orders Placed This Encounter  Procedures  . Cpap titration    Standing Status:   Future    Standing Expiration Date:   07/10/2021    Order Specific Question:   Where should this test be performed:    Answer:   [facility]     Time spent: 30  I have personally obtained a history, examined the patient, evaluated laboratory and imaging results, formulated the assessment and plan and placed orders.    Allyne Gee, MD [facility] Pulmonary and Critical Care Sleep medicine

## 2020-07-10 NOTE — Patient Instructions (Addendum)

## 2020-07-10 NOTE — Telephone Encounter (Signed)
Gave FG orders for sleep study. br

## 2020-08-03 ENCOUNTER — Other Ambulatory Visit: Payer: Self-pay | Admitting: Nurse Practitioner

## 2020-08-03 DIAGNOSIS — M545 Low back pain, unspecified: Secondary | ICD-10-CM

## 2020-08-03 DIAGNOSIS — F411 Generalized anxiety disorder: Secondary | ICD-10-CM

## 2020-08-07 ENCOUNTER — Other Ambulatory Visit: Payer: Self-pay | Admitting: Nurse Practitioner

## 2020-08-07 DIAGNOSIS — F411 Generalized anxiety disorder: Secondary | ICD-10-CM

## 2020-08-07 DIAGNOSIS — M545 Low back pain, unspecified: Secondary | ICD-10-CM

## 2020-08-08 ENCOUNTER — Other Ambulatory Visit: Payer: Self-pay

## 2020-08-08 DIAGNOSIS — F411 Generalized anxiety disorder: Secondary | ICD-10-CM

## 2020-08-08 DIAGNOSIS — E782 Mixed hyperlipidemia: Secondary | ICD-10-CM

## 2020-08-08 DIAGNOSIS — M545 Low back pain, unspecified: Secondary | ICD-10-CM

## 2020-08-08 MED ORDER — ATORVASTATIN CALCIUM 40 MG PO TABS: 40.0000 mg | ORAL_TABLET | Freq: Every day | ORAL | 1 refills | Status: DC

## 2020-08-08 MED ORDER — MELOXICAM 15 MG PO TABS: 15.0000 mg | ORAL_TABLET | Freq: Every day | ORAL | 1 refills | Status: DC

## 2020-08-08 MED ORDER — ESCITALOPRAM OXALATE 20 MG PO TABS: 20.0000 mg | ORAL_TABLET | Freq: Every day | ORAL | 1 refills | Status: DC

## 2020-08-21 ENCOUNTER — Encounter: Payer: Self-pay | Admitting: Internal Medicine

## 2020-08-21 ENCOUNTER — Ambulatory Visit: Payer: Managed Care, Other (non HMO) | Admitting: Internal Medicine

## 2020-08-21 ENCOUNTER — Other Ambulatory Visit: Payer: Self-pay

## 2020-08-21 VITALS — BP 132/80 | HR 80 | Temp 98.1°F | Resp 16 | Ht 62.0 in | Wt 172.4 lb

## 2020-08-21 DIAGNOSIS — G4733 Obstructive sleep apnea (adult) (pediatric): Secondary | ICD-10-CM

## 2020-08-21 DIAGNOSIS — Z6831 Body mass index (BMI) 31.0-31.9, adult: Secondary | ICD-10-CM | POA: Diagnosis not present

## 2020-08-21 DIAGNOSIS — F1210 Cannabis abuse, uncomplicated: Secondary | ICD-10-CM | POA: Diagnosis not present

## 2020-08-21 NOTE — Patient Instructions (Signed)

## 2020-08-21 NOTE — Progress Notes (Signed)
[facility] [address] [place], [place] [address]  Pulmonary Sleep Medicine   Office Visit Note  Patient Name: [name] DOB: 31-Mar-1956 MRN [id number]  Date of Service: 08/21/2020  Complaints/HPI: OSA she had a sleep study done which revealed that her overall AHI was 21/h however she had significant worsening during REM sleep to 67/h and 29.8 in the supine position.  She is therefore a great candidate for CPAP.  Whether we use auto titration or fixed pressure should be determined by an optimal CPAP titration.  I spoke with her regarding the findings of the sleep study.  We reviewed it in detail she had questions which were answered.  ROS  General: (-) fever, (-) chills, (-) night sweats, (-) weakness Skin: (-) rashes, (-) itching,. Eyes: (-) visual changes, (-) redness, (-) itching. Nose and Sinuses: (-) nasal stuffiness or itchiness, (-) postnasal drip, (-) nosebleeds, (-) sinus trouble. Mouth and Throat: (-) sore throat, (-) hoarseness. Neck: (-) swollen glands, (-) enlarged thyroid, (-) neck pain. Respiratory: - cough, (-) bloody sputum, - shortness of breath, - wheezing. Cardiovascular: - ankle swelling, (-) chest pain. Lymphatic: (-) lymph node enlargement. Neurologic: (-) numbness, (-) tingling. Psychiatric: (-) anxiety, (-) depression   Current Medication: Outpatient Encounter Medications as of 08/21/2020  Medication Sig   amphetamine-dextroamphetamine (ADDERALL) 5 MG tablet Take 1 tablet (5 mg total) by mouth 2 (two) times daily with a meal.   atorvastatin (LIPITOR) 40 MG tablet Take 1 tablet (40 mg total) by mouth daily at 6 PM.   busPIRone (BUSPAR) 5 MG tablet Take 1 tablet po BID prn anxiety (Patient taking differently: Take 5 mg by mouth 2 (two) times daily as needed (anxiety).)   clonazePAM (KLONOPIN) 0.5 MG tablet Take 1 tablet (0.5 mg total) by mouth at bedtime as needed for anxiety.   escitalopram (LEXAPRO) 20 MG tablet Take 1 tablet (20  mg total) by mouth daily.   fenofibrate 54 MG tablet Take 1 tablet (54 mg total) by mouth daily.   fluticasone (FLONASE) 50 MCG/ACT nasal spray Place 2 sprays into both nostrils daily.   gabapentin (NEURONTIN) 400 MG capsule Take 1 capsule (400 mg total) by mouth 3 (three) times daily.   HYDROcodone-acetaminophen (NORCO) 5-325 MG tablet Take 1-2 tablets by mouth every 6 (six) hours as needed.   meloxicam (MOBIC) 15 MG tablet Take 1 tablet (15 mg total) by mouth at bedtime.   omeprazole (PRILOSEC) 40 MG capsule Take 1 capsule (40 mg total) by mouth every morning.   No facility-administered encounter medications on file as of 08/21/2020.    Surgical History: Past Surgical History:  Procedure Laterality Date   CARPAL TUNNEL RELEASE Right 11/22/2019   Procedure: CARPAL TUNNEL RELEASE;  Surgeon: Earnestine Leys, MD;  Location: [facility] ORS;  Service: Orthopedics;  Laterality: Right;   COLONOSCOPY     TOE SURGERY  2005,2011   ingrown toenails in doctors office   UPPER GI ENDOSCOPY      Medical History: Past Medical History:  Diagnosis Date   ADHD    Allergy    Anxiety    Colon polyp    Diverticulosis    GERD (gastroesophageal reflux disease)    Hyperlipidemia     Family History: Family History  Problem Relation Age of Onset   Lung cancer Mother    Heart disease Father    Pancreatic cancer Sister     Social History: Social History   Socioeconomic History   Marital status: Married  Spouse name: Not on file   Number of children: Not on file   Years of education: Not on file   Highest education level: Not on file  Occupational History   Not on file  Tobacco Use   Smoking status: Never Smoker   Smokeless tobacco: Never Used  Vaping Use   Vaping Use: Never used  Substance and Sexual Activity   Alcohol use: Yes    Comment: ocassionally    Drug use: Yes    Types: Marijuana    Comment: daily use   Sexual activity: Not on file  Other Topics  Concern   Not on file  Social History Narrative   Not on file   Social Determinants of Health   Financial Resource Strain: Not on file  Food Insecurity: Not on file  Transportation Needs: Not on file  Physical Activity: Not on file  Stress: Not on file  Social Connections: Not on file  Intimate Partner Violence: Not on file    Vital Signs: Blood pressure 132/80, pulse 80, temperature 98.1 F (36.7 C), resp. rate 16, height 5\' 2"  (1.575 m), weight 172 lb 6.4 oz (78.2 kg), SpO2 98 %.  Examination: General Appearance: The patient is well-developed, well-nourished, and in no distress. Skin: Gross inspection of skin unremarkable. Head: normocephalic, no gross deformities. Eyes: no gross deformities noted. ENT: ears appear grossly normal no exudates. Neck: Supple. No thyromegaly. No LAD. Respiratory: few rhonchi. Cardiovascular: Normal S1 and S2 without murmur or rub. Extremities: No cyanosis. pulses are equal. Neurologic: Alert and oriented. No involuntary movements.  LABS: Recent Results (from the past 2160 hour(s))  POCT Urine Drug Screen     Status: Abnormal   Collection Time: 05/25/20 11:59 AM  Result Value Ref Range   POC Methamphetamine UR None Detected None Detected   POC Opiate Ur None Detected None Detected   POC Barbiturate UR None Detected None Detected   POC Amphetamine UR None Detected None Detected   POC Oxycodone UR None Detected None Detected   POC Cocaine UR None Detected None Detected   POC Ecstasy UR None Detected None Detected   POC TRICYCLICS UR None Detected None Detected   POC PHENCYCLIDINE UR None Detected None Detected   POC Marijuana UR Positive (A) None Detected   POC Methadone UR None Detected None Detected   POC BENZODIAZEPINES UR None Detected None Detected   URINE TEMPERATURE     POC DRUG SCREEN OXIDANTS URINE     POC SPECIFIC GRAVITY URINE     POC PH URINE     Methylenedioxyamphetamine None Detected None Detected    Radiology: NM  GASTRIC EMPTYING  Result Date: 07/07/2020 CLINICAL DATA:  Chronic nausea since the 1990s off and on, worse in past year, some vomiting, nausea worse in morning EXAM: NUCLEAR MEDICINE GASTRIC EMPTYING SCAN TECHNIQUE: After oral ingestion of radiolabeled meal, sequential abdominal images were obtained for 4 hours. Percentage of activity emptying the stomach was calculated at 1 hour, 2 hour, 3 hour, and 4 hours. RADIOPHARMACEUTICALS:  2.03 mCi Tc-2m sulfur colloid in standardized meal COMPARISON:  None FINDINGS: Expected location of the stomach in the left upper quadrant. Ingested meal empties the stomach gradually over the course of the study. 37% emptied at 1 hr ( normal >= 10%) 71% emptied at 2 hr ( normal >= 40%) 86% emptied at 3 hr ( normal >= 70%) 95% emptied at 4 hr ( normal >= 90%) IMPRESSION: Normal gastric emptying study. Electronically Signed   By:  Lavonia Dana M.D.   On: 07/07/2020 17:19    No results found.  No results found.    Assessment and Plan: Patient Active Problem List   Diagnosis Date Noted   Excessive daytime sleepiness 06/21/2020   Loud snoring 06/21/2020   BMI 32.0-32.9,adult 03/24/2020   Needs flu shot 03/24/2020   Vasomotor rhinitis 08/22/2019   Encounter for general adult medical examination with abnormal findings 05/16/2019   Encounter for long-term (current) use of medications 04/17/2019   Screening for breast cancer 02/09/2018   Routine cervical smear 02/09/2018   Gastroesophageal reflux disease without esophagitis 11/12/2017   Urinary tract infection with hematuria 07/30/2017   Acute cystitis with hematuria 07/30/2017   Low back pain at multiple sites 07/30/2017   Dysuria 07/30/2017   Mixed hyperlipidemia 07/30/2017   Attention and concentration deficit 07/30/2017   GAD (generalized anxiety disorder) 07/30/2017   Other fatigue 07/30/2017    1. OSA (obstructive sleep apnea) She will be scheduled for a CPAP titration to to determine the  optimal pressure settings she had significant worsening during REM sleep as well as supine sleep that may be quite variable. - Cpap titration; Future  2. Obesity, morbid ([place]) Obesity Counseling: Had a lengthy discussion regarding patients BMI and weight issues. Patient was instructed on portion control as well as increased activity. Also discussed caloric restrictions with trying to maintain intake less than 2000 Kcal. Discussions were made in accordance with the 5As of weight management. Simple actions such as not eating late and if able to, taking a walk is suggested.   3. Cannabis abuse without complication She does not smoke cigarettes but has ongoing cannabis use once again was counseled on smoking cessation at length.   General Counseling: I have discussed the findings of the evaluation and examination with Mileigh.  I have also discussed any further diagnostic evaluation thatmay be needed or ordered today. Nyja verbalizes understanding of the findings of todays visit. We also reviewed her medications today and discussed drug interactions and side effects including but not limited excessive drowsiness and altered mental states. We also discussed that there is always a risk not just to her but also people around her. she has been encouraged to call the office with any questions or concerns that should arise related to todays visit.  Orders Placed This Encounter  Procedures   Cpap titration    Standing Status:   Future    Standing Expiration Date:   08/21/2021    Order Specific Question:   Where should this test be performed:    Answer:   [facility]     Time spent: 18  I have personally obtained a history, examined the patient, evaluated laboratory and imaging results, formulated the assessment and plan and placed orders.    Allyne Gee, MD [facility] Pulmonary and Critical Care Sleep medicine

## 2020-08-23 ENCOUNTER — Ambulatory Visit: Payer: Managed Care, Other (non HMO) | Admitting: Hospice and Palliative Medicine

## 2020-08-23 ENCOUNTER — Encounter: Payer: Self-pay | Admitting: Hospice and Palliative Medicine

## 2020-08-23 ENCOUNTER — Other Ambulatory Visit: Payer: Self-pay

## 2020-08-23 VITALS — BP 126/82 | HR 67 | Temp 97.0°F | Resp 16 | Ht 62.0 in | Wt 171.0 lb

## 2020-08-23 DIAGNOSIS — R41840 Attention and concentration deficit: Secondary | ICD-10-CM | POA: Diagnosis not present

## 2020-08-23 DIAGNOSIS — E782 Mixed hyperlipidemia: Secondary | ICD-10-CM

## 2020-08-23 DIAGNOSIS — Z1231 Encounter for screening mammogram for malignant neoplasm of breast: Secondary | ICD-10-CM | POA: Diagnosis not present

## 2020-08-23 DIAGNOSIS — R5383 Other fatigue: Secondary | ICD-10-CM

## 2020-08-23 DIAGNOSIS — Z79899 Other long term (current) drug therapy: Secondary | ICD-10-CM | POA: Diagnosis not present

## 2020-08-23 LAB — POCT URINE DRUG SCREEN
Methylenedioxyamphetamine: NOT DETECTED
POC Amphetamine UR: POSITIVE — AB
POC BENZODIAZEPINES UR: NOT DETECTED
POC Barbiturate UR: NOT DETECTED
POC Cocaine UR: NOT DETECTED
POC Ecstasy UR: NOT DETECTED
POC Marijuana UR: POSITIVE — AB
POC Methadone UR: NOT DETECTED
POC Methamphetamine UR: NOT DETECTED
POC Opiate Ur: NOT DETECTED
POC Oxycodone UR: NOT DETECTED
POC PHENCYCLIDINE UR: NOT DETECTED
POC TRICYCLICS UR: NOT DETECTED

## 2020-08-23 MED ORDER — AMPHETAMINE-DEXTROAMPHETAMINE 5 MG PO TABS: 5.0000 mg | ORAL_TABLET | Freq: Every day | ORAL | 0 refills | Status: DC

## 2020-08-23 NOTE — Progress Notes (Signed)
[facility] [place],  [id number]  Internal MEDICINE  Office Visit Note  Patient Name: [name]  [id number]  [id number]  Date of Service: 08/23/2020  Chief Complaint  Patient presents with  . Follow-up    Refill request  . Hyperlipidemia  . Gastroesophageal Reflux  . Anxiety  . Quality Metric Gaps    Dexa    HPI Patient is here for routine follow-up In the process of getting set up for CPAP for new findings of OSA Continues to take low dose Adderall as needed to stay focused and concentrated at [facility] full time at [facility] Only takes a few days per week and only takes one dose of medication Denies any negative side effects from mediation, headaches, chest pain, palpitations or insomnia Continues to sleep well without issus  Due for routine labs and screening mammgram  Current Medication: Outpatient Encounter Medications as of 08/23/2020  Medication Sig  . amphetamine-dextroamphetamine (ADDERALL) 5 MG tablet Take 1 tablet (5 mg total) by mouth daily with breakfast.  . atorvastatin (LIPITOR) 40 MG tablet Take 1 tablet (40 mg total) by mouth daily at 6 PM.  . busPIRone (BUSPAR) 5 MG tablet Take 1 tablet po BID prn anxiety (Patient taking differently: Take 5 mg by mouth 2 (two) times daily as needed (anxiety).)  . clonazePAM (KLONOPIN) 0.5 MG tablet Take 1 tablet (0.5 mg total) by mouth at bedtime as needed for anxiety.  Marland Kitchen escitalopram (LEXAPRO) 20 MG tablet Take 1 tablet (20 mg total) by mouth daily.  . fenofibrate 54 MG tablet Take 1 tablet (54 mg total) by mouth daily.  . fluticasone (FLONASE) 50 MCG/ACT nasal spray Place 2 sprays into both nostrils daily.  Marland Kitchen gabapentin (NEURONTIN) 400 MG capsule Take 1 capsule (400 mg total) by mouth 3 (three) times daily.  Marland Kitchen HYDROcodone-acetaminophen (NORCO) 5-325 MG tablet Take 1-2 tablets by mouth every 6 (six) hours as needed.  . meloxicam (MOBIC) 15 MG tablet Take 1 tablet (15 mg total) by mouth  at bedtime.  Marland Kitchen omeprazole (PRILOSEC) 40 MG capsule Take 1 capsule (40 mg total) by mouth every morning.  . [DISCONTINUED] amphetamine-dextroamphetamine (ADDERALL) 5 MG tablet Take 1 tablet (5 mg total) by mouth 2 (two) times daily with a meal.   No facility-administered encounter medications on file as of 08/23/2020.    Surgical History: Past Surgical History:  Procedure Laterality Date  . CARPAL TUNNEL RELEASE Right 11/22/2019   Procedure: CARPAL TUNNEL RELEASE;  Surgeon: Earnestine Leys, MD;  Location: [facility] ORS;  Service: Orthopedics;  Laterality: Right;  . COLONOSCOPY    . TOE SURGERY  2005,2011   ingrown toenails in doctors office  . UPPER GI ENDOSCOPY      Medical History: Past Medical History:  Diagnosis Date  . ADHD   . Allergy   . Anxiety   . Colon polyp   . Diverticulosis   . GERD (gastroesophageal reflux disease)   . Hyperlipidemia     Family History: Family History  Problem Relation Age of Onset  . Lung cancer Mother   . Heart disease Father   . Pancreatic cancer Sister     Social History   Socioeconomic History  . Marital status: Married    Spouse name: Not on file  . Number of children: Not on file  . Years of education: Not on file  . Highest education level: Not on file  Occupational History  . Not on file  Tobacco Use  . Smoking  status: Never Smoker  . Smokeless tobacco: Never Used  Vaping Use  . Vaping Use: Never used  Substance and Sexual Activity  . Alcohol use: Yes    Comment: ocassionally   . Drug use: Yes    Types: Marijuana    Comment: daily use  . Sexual activity: Not on file  Other Topics Concern  . Not on file  Social History Narrative  . Not on file   Social Determinants of Health   Financial Resource Strain: Not on file  Food Insecurity: Not on file  Transportation Needs: Not on file  Physical Activity: Not on file  Stress: Not on file  Social Connections: Not on file  Intimate Partner Violence: Not on file       Review of Systems  Constitutional: Negative for chills, diaphoresis and fatigue.  HENT: Negative for ear pain, postnasal drip and sinus pressure.   Eyes: Negative for photophobia, discharge, redness, itching and visual disturbance.  Respiratory: Negative for cough, shortness of breath and wheezing.   Cardiovascular: Negative for chest pain, palpitations and leg swelling.  Gastrointestinal: Negative for abdominal pain, constipation, diarrhea, nausea and vomiting.  Genitourinary: Negative for dysuria and flank pain.  Musculoskeletal: Negative for arthralgias, back pain, gait problem and neck pain.  Skin: Negative for color change.  Allergic/Immunologic: Negative for environmental allergies and food allergies.  Neurological: Negative for dizziness and headaches.  Hematological: Does not bruise/bleed easily.  Psychiatric/Behavioral: Negative for agitation, behavioral problems (depression) and hallucinations.    Vital Signs: BP 126/82   Pulse 67   Temp (!) 97 F (36.1 C)   Resp 16   Ht 5\' 2"  (1.575 m)   Wt 171 lb (77.6 kg)   SpO2 98%   BMI 31.28 kg/m    Physical Exam Vitals reviewed.  Constitutional:      Appearance: Normal appearance. She is normal weight.  Cardiovascular:     Rate and Rhythm: Normal rate and regular rhythm.     Pulses: Normal pulses.     Heart sounds: Normal heart sounds.  Pulmonary:     Effort: Pulmonary effort is normal.     Breath sounds: Normal breath sounds.  Abdominal:     General: Abdomen is flat.     Palpations: Abdomen is soft.  Musculoskeletal:        General: Normal range of motion.     Cervical back: Normal range of motion.  Skin:    General: Skin is warm.  Neurological:     General: No focal deficit present.     Mental Status: She is alert and oriented to person, place, and time. Mental status is at baseline.  Psychiatric:        Mood and Affect: Mood normal.        Behavior: Behavior normal.        Thought Content: Thought  content normal.        Judgment: Judgment normal.    Assessment/Plan: 1. Encounter for screening mammogram for malignant neoplasm of breast - MM Digital Screening; Future  2. Attention and concentration deficit May continue with low dose Adderall as needed [place] Controlled Substance Database was reviewed by me for overdose risk score (ORS) - amphetamine-dextroamphetamine (ADDERALL) 5 MG tablet; Take 1 tablet (5 mg total) by mouth daily with breakfast.  Dispense: 30 tablet; Refill: 0 - amphetamine-dextroamphetamine (ADDERALL) 5 MG tablet; Take 1 tablet (5 mg total) by mouth daily.  Dispense: 30 tablet; Refill: 0  3. Mixed hyperlipidemia Tolerating statin well, review  updated levels and adjust care accordingly - Lipid Panel With LDL/HDL Ratio  4. Encounter for long-term (current) use of medications - POCT Urine Drug Screen  5. Other fatigue - CBC w/Diff/Platelet - Comprehensive Metabolic Panel (CMET) - Lipid Panel With LDL/HDL Ratio - TSH + free T4 - B12 - Vitamin D (25 hydroxy)  General Counseling: Jasiya verbalizes understanding of the findings of todays visit and agrees with plan of treatment. I have discussed any further diagnostic evaluation that may be needed or ordered today. We also reviewed her medications today. she has been encouraged to call the office with any questions or concerns that should arise related to todays visit.    Orders Placed This Encounter  Procedures  . CBC w/Diff/Platelet  . Comprehensive Metabolic Panel (CMET)  . Lipid Panel With LDL/HDL Ratio  . TSH + free T4  . B12  . Vitamin D (25 hydroxy)  . POCT Urine Drug Screen    Meds ordered this encounter  Medications  . amphetamine-dextroamphetamine (ADDERALL) 5 MG tablet    Sig: Take 1 tablet (5 mg total) by mouth daily with breakfast.    Dispense:  30 tablet    Refill:  0    Time spent: 30 Minutes Time spent includes review of chart, medications, test results and follow-up plan with the  patient.  This patient was seen by Theodoro Grist AGNP-C in Collaboration with Dr Lavera Guise as a part of collaborative care agreement     Tanna Furry. Marcea Rojek AGNP-C Internal medicine

## 2020-08-24 ENCOUNTER — Encounter: Payer: Self-pay | Admitting: Hospice and Palliative Medicine

## 2020-09-18 ENCOUNTER — Ambulatory Visit: Payer: Managed Care, Other (non HMO) | Admitting: Internal Medicine

## 2020-11-20 ENCOUNTER — Ambulatory Visit (INDEPENDENT_AMBULATORY_CARE_PROVIDER_SITE_OTHER): Payer: Managed Care, Other (non HMO) | Admitting: Internal Medicine

## 2020-11-20 VITALS — BP 104/69 | HR 65 | Resp 16 | Ht 65.0 in | Wt 176.0 lb

## 2020-11-20 DIAGNOSIS — Z7189 Other specified counseling: Secondary | ICD-10-CM

## 2020-11-20 DIAGNOSIS — G4733 Obstructive sleep apnea (adult) (pediatric): Secondary | ICD-10-CM | POA: Diagnosis not present

## 2020-11-20 DIAGNOSIS — E663 Overweight: Secondary | ICD-10-CM | POA: Diagnosis not present

## 2020-11-20 DIAGNOSIS — Z9989 Dependence on other enabling machines and devices: Secondary | ICD-10-CM | POA: Diagnosis not present

## 2020-11-20 NOTE — Progress Notes (Signed)
[facility] [place], [place] [address]  Pulmonary Sleep Medicine   Office Visit Note  Patient Name: [name] DOB: [birth year] MRN [id number]    Chief Complaint: Obstructive Sleep Apnea visit  Brief History:  Zunairah is seen today for initial PAP therapy consult to establish care. She is currently using APAP @4  - 20 cmH2O.  Her original symptoms were loud snoring, fatigue, and stops breathing at night.  The patient has a 6 month history of sleep apnea. Patient is using PAP nightly.  The patient feels a lot better after sleeping with PAP.  The patient reports benefiting from PAP use. Reported sleepiness is  no longer a problem and the Epworth Sleepiness Score is 1 out of 24. The patient does not take naps. The patient complains of the following: mask being too tight; reminded to wipe daily and washing weekly.  The compliance download shows average use time of 6:12 hours. The AHI is 2.2  The patient does not complain  of limb movements disrupting sleep.  ROS  General: (-) fever, (-) chills, (-) night sweat Nose and Sinuses: (-) nasal stuffiness or itchiness, (-) postnasal drip, (-) nosebleeds, (-) sinus trouble. Mouth and Throat: (-) sore throat, (-) hoarseness. Neck: (-) swollen glands, (-) enlarged thyroid, (-) neck pain. Respiratory: - cough, - shortness of breath, - wheezing. Neurologic: - numbness, - tingling. Psychiatric: + anxiety, + depression   Current Medication: Outpatient Encounter Medications as of 11/20/2020  Medication Sig   amphetamine-dextroamphetamine (ADDERALL) 5 MG tablet Take 1 tablet (5 mg total) by mouth daily with breakfast.   amphetamine-dextroamphetamine (ADDERALL) 5 MG tablet Take 1 tablet (5 mg total) by mouth daily.   atorvastatin (LIPITOR) 40 MG tablet Take 1 tablet (40 mg total) by mouth daily at 6 PM.   busPIRone (BUSPAR) 5 MG tablet Take 1 tablet po BID prn anxiety (Patient taking differently: Take 5 mg by mouth 2 (two)  times daily as needed (anxiety).)   clonazePAM (KLONOPIN) 0.5 MG tablet Take 1 tablet (0.5 mg total) by mouth at bedtime as needed for anxiety.   COVID-19 mRNA vaccine, Pfizer, 30 MCG/0.3ML injection USE AS DIRECTED   escitalopram (LEXAPRO) 20 MG tablet Take 1 tablet (20 mg total) by mouth daily.   fenofibrate 54 MG tablet Take 1 tablet (54 mg total) by mouth daily.   fluticasone (FLONASE) 50 MCG/ACT nasal spray Place 2 sprays into both nostrils daily.   HYDROcodone-acetaminophen (NORCO) 5-325 MG tablet Take 1-2 tablets by mouth every 6 (six) hours as needed.   meloxicam (MOBIC) 15 MG tablet Take 1 tablet (15 mg total) by mouth at bedtime.   omeprazole (PRILOSEC) 40 MG capsule Take 1 capsule (40 mg total) by mouth every morning.   [DISCONTINUED] gabapentin (NEURONTIN) 400 MG capsule Take 1 capsule (400 mg total) by mouth 3 (three) times daily.   No facility-administered encounter medications on file as of 11/20/2020.    Surgical History: Past Surgical History:  Procedure Laterality Date   CARPAL TUNNEL RELEASE Right 11/22/2019   Procedure: CARPAL TUNNEL RELEASE;  Surgeon: Earnestine Leys, MD;  Location: [facility] ORS;  Service: Orthopedics;  Laterality: Right;   COLONOSCOPY     TOE SURGERY  2005,2011   ingrown toenails in doctors office   UPPER GI ENDOSCOPY      Medical History: Past Medical History:  Diagnosis Date   ADHD    Allergy    Anxiety    Colon polyp    Diverticulosis  GERD (gastroesophageal reflux disease)    Hyperlipidemia     Family History: Non contributory to the present illness  Social History: Social History   Socioeconomic History   Marital status: Married    Spouse name: Not on file   Number of children: Not on file   Years of education: Not on file   Highest education level: Not on file  Occupational History   Not on file  Tobacco Use   Smoking status: Never   Smokeless tobacco: Never  Vaping Use   Vaping Use: Never used  Substance and Sexual  Activity   Alcohol use: Yes    Comment: ocassionally    Drug use: Yes    Types: Marijuana    Comment: daily use   Sexual activity: Not on file  Other Topics Concern   Not on file  Social History Narrative   Not on file   Social Determinants of Health   Financial Resource Strain: Not on file  Food Insecurity: Not on file  Transportation Needs: Not on file  Physical Activity: Not on file  Stress: Not on file  Social Connections: Not on file  Intimate Partner Violence: Not on file    Vital Signs: Blood pressure 104/69, pulse 65, resp. rate 16, height 5\' 5"  (1.651 m), weight 176 lb (79.8 kg), SpO2 97 %.  Examination: General Appearance: The patient is well-developed, well-nourished, and in no distress. Neck Circumference: 38.5 Skin: Gross inspection of skin unremarkable. Head: normocephalic, no gross deformities. Eyes: no gross deformities noted. ENT: ears appear grossly normal Neurologic: Alert and oriented. No involuntary movements.    EPWORTH SLEEPINESS SCALE:  Scale:  (0)= no chance of dozing; (1)= slight chance of dozing; (2)= moderate chance of dozing; (3)= high chance of dozing  Chance  Situtation    Sitting and reading: 0    Watching TV: 0    Sitting Inactive in public: 0    As a passenger in car: 0      Lying down to rest: 1    Sitting and talking: 0    Sitting quielty after lunch: 0    In a car, stopped in traffic: 0104/69   TOTAL SCORE:   1 out of 24    SLEEP STUDIES:  PSG 06/27/20  -  AHI 20.9 (REM AHI 67.6)(supine AHI 29.8), min SpO2  79%   CPAP COMPLIANCE DATA:  Date Range: 10/17/20 - 11/15/20  Average Daily Use: 6:12 hours  Median Use: 6:25 hours  Compliance for > 4 Hours: 87%  AHI: 2.2 respiratory events per hour  Days Used: 30/30 days  Mask Leak: 23.5 Lpm  95th Percentile Pressure: 12.1 cmH2O         LABS: Recent Results (from the past 2160 hour(s))  POCT Urine Drug Screen     Status: Abnormal   Collection  Time: 08/23/20 11:55 AM  Result Value Ref Range   POC Methamphetamine UR None Detected None Detected   POC Opiate Ur None Detected None Detected   POC Barbiturate UR None Detected None Detected   POC Amphetamine UR Positive (A) None Detected   POC Oxycodone UR None Detected None Detected   POC Cocaine UR None Detected None Detected   POC Ecstasy UR None Detected None Detected   POC TRICYCLICS UR None Detected None Detected   POC PHENCYCLIDINE UR None Detected None Detected   POC Marijuana UR Positive (A) None Detected   POC Methadone UR None Detected None Detected   POC BENZODIAZEPINES UR  None Detected None Detected   URINE TEMPERATURE     POC DRUG SCREEN OXIDANTS URINE     POC SPECIFIC GRAVITY URINE     POC PH URINE     Methylenedioxyamphetamine None Detected None Detected    Radiology: NM GASTRIC EMPTYING  Result Date: 07/07/2020 CLINICAL DATA:  Chronic nausea since the 1990s off and on, worse in past year, some vomiting, nausea worse in morning EXAM: NUCLEAR MEDICINE GASTRIC EMPTYING SCAN TECHNIQUE: After oral ingestion of radiolabeled meal, sequential abdominal images were obtained for 4 hours. Percentage of activity emptying the stomach was calculated at 1 hour, 2 hour, 3 hour, and 4 hours. RADIOPHARMACEUTICALS:  2.03 mCi Tc-84m sulfur colloid in standardized meal COMPARISON:  None FINDINGS: Expected location of the stomach in the left upper quadrant. Ingested meal empties the stomach gradually over the course of the study. 37% emptied at 1 hr ( normal >= 10%) 71% emptied at 2 hr ( normal >= 40%) 86% emptied at 3 hr ( normal >= 70%) 95% emptied at 4 hr ( normal >= 90%) IMPRESSION: Normal gastric emptying study. Electronically Signed   By: Lavonia Dana M.D.   On: 07/07/2020 17:19    No results found.  No results found.    Assessment and Plan: Patient Active Problem List   Diagnosis Date Noted   Excessive daytime sleepiness 06/21/2020   Loud snoring 06/21/2020   BMI  32.0-32.9,adult 03/24/2020   Needs flu shot 03/24/2020   Vasomotor rhinitis 08/22/2019   Encounter for general adult medical examination with abnormal findings 05/16/2019   Encounter for long-term (current) use of medications 04/17/2019   Screening for breast cancer 02/09/2018   Routine cervical smear 02/09/2018   Gastroesophageal reflux disease without esophagitis 11/12/2017   Urinary tract infection with hematuria 07/30/2017   Acute cystitis with hematuria 07/30/2017   Low back pain at multiple sites 07/30/2017   Dysuria 07/30/2017   Mixed hyperlipidemia 07/30/2017   Attention and concentration deficit 07/30/2017   GAD (generalized anxiety disorder) 07/30/2017   Other fatigue 07/30/2017    1. OSA on CPAP The patient does  tolerate PAP and reports definite benefit from PAP use. The patient was reminded how to clean equipment and advised to replace supplies routinely. The patient was also counselled on weight loss. The compliance is very good . The AHI is 2.2. OSA- continue very good compliance with cpap. F/u in one year   2. CPAP use counseling CPAP Counseling: had a lengthy discussion with the patient regarding the importance of PAP therapy in management of the sleep apnea. Patient appears to understand the risk factor reduction and also understands the risks associated with untreated sleep apnea. Patient will try to make a good faith effort to remain compliant with therapy. Also instructed the patient on proper cleaning of the device including the water must be changed daily if possible and use of distilled water is preferred. Patient understands that the machine should be regularly cleaned with appropriate recommended cleaning solutions that do not damage the PAP machine for example given white vinegar and water rinses. Other methods such as ozone treatment may not be as good as these simple methods to achieve cleaning.   3. Overweight (BMI 25.0-29.9) Overweight Counseling: Had a  lengthy discussion regarding patients BMI and weight issues. Patient was instructed on portion control as well as increased activity. Also discussed caloric restrictions with trying to maintain intake less than 2000 Kcal. Discussions were made in accordance with the 5As of weight management.  Simple actions such as not eating late and if able to, taking a walk is suggested.    General Counseling: I have discussed the findings of the evaluation and examination with Zakyia.  I have also discussed any further diagnostic evaluation thatmay be needed or ordered today. Avigail verbalizes understanding of the findings of todays visit. We also reviewed her medications today and discussed drug interactions and side effects including but not limited excessive drowsiness and altered mental states. We also discussed that there is always a risk not just to her but also people around her. she has been encouraged to call the office with any questions or concerns that should arise related to todays visit.  No orders of the defined types were placed in this encounter.       I have personally obtained a history, examined the patient, evaluated laboratory and imaging results, formulated the assessment and plan and placed orders.  This patient was seen today by Tressie Ellis, PA-C in collaboration with Dr. Devona Konig.   Allyne Gee, MD [facility] Diplomate ABMS Pulmonary and Critical Care Medicine Sleep medicine

## 2020-11-20 NOTE — Patient Instructions (Signed)

## 2020-11-24 ENCOUNTER — Other Ambulatory Visit: Payer: Self-pay

## 2020-11-24 ENCOUNTER — Encounter: Payer: Self-pay | Admitting: Physician Assistant

## 2020-11-24 ENCOUNTER — Ambulatory Visit: Payer: Managed Care, Other (non HMO) | Admitting: Physician Assistant

## 2020-11-24 DIAGNOSIS — K219 Gastro-esophageal reflux disease without esophagitis: Secondary | ICD-10-CM

## 2020-11-24 DIAGNOSIS — R41840 Attention and concentration deficit: Secondary | ICD-10-CM | POA: Diagnosis not present

## 2020-11-24 DIAGNOSIS — E782 Mixed hyperlipidemia: Secondary | ICD-10-CM | POA: Diagnosis not present

## 2020-11-24 DIAGNOSIS — F411 Generalized anxiety disorder: Secondary | ICD-10-CM | POA: Diagnosis not present

## 2020-11-24 MED ORDER — AMPHETAMINE-DEXTROAMPHETAMINE 5 MG PO TABS: 5.0000 mg | ORAL_TABLET | Freq: Every day | ORAL | 0 refills | Status: DC

## 2020-11-24 MED ORDER — TRAZODONE HCL 50 MG PO TABS: 50.0000 mg | ORAL_TABLET | Freq: Every day | ORAL | 2 refills | Status: DC

## 2020-11-24 NOTE — Progress Notes (Signed)
[facility] [place], [place] [address]  Internal MEDICINE  Office Visit Note  Patient Name: [name]  [id number]  [id number]  Date of Service: 11/24/2020  Chief Complaint  Patient presents with   Follow-up    refills   Gastroesophageal Reflux   Hyperlipidemia   Anxiety   Quality Metric Gaps    Shingrix, dexa, covid    HPI Pt is here for routine follow up and med refill.  -She has been taking 5mg  adderall in the AM, may take second 1/2 tab if needed. Only takes this a few days a week. No trouble sleeping on days with extra 1/2 dose. -Does report occasional difficulty sleeping on stressful work days. Previously took klonopin, would be open to trying an alternative -Lexapro 20mg  every morning seems to be working well. Has not taken buspar in a long time  -Did not have a chance to get lab work or schedule mammogram and will work on this now  Current Medication: Outpatient Encounter Medications as of 11/24/2020  Medication Sig   amphetamine-dextroamphetamine (ADDERALL) 5 MG tablet Take 1 tablet (5 mg total) by mouth daily with breakfast.   atorvastatin (LIPITOR) 40 MG tablet Take 1 tablet (40 mg total) by mouth daily at 6 PM.   busPIRone (BUSPAR) 5 MG tablet Take 1 tablet po BID prn anxiety (Patient taking differently: Take 5 mg by mouth 2 (two) times daily as needed (anxiety).)   clonazePAM (KLONOPIN) 0.5 MG tablet Take 1 tablet (0.5 mg total) by mouth at bedtime as needed for anxiety.   COVID-19 mRNA vaccine, Pfizer, 30 MCG/0.3ML injection USE AS DIRECTED   escitalopram (LEXAPRO) 20 MG tablet Take 1 tablet (20 mg total) by mouth daily.   fenofibrate 54 MG tablet Take 1 tablet (54 mg total) by mouth daily.   fluticasone (FLONASE) 50 MCG/ACT nasal spray Place 2 sprays into both nostrils daily.   meloxicam (MOBIC) 15 MG tablet Take 1 tablet (15 mg total) by mouth at bedtime.   omeprazole (PRILOSEC) 40 MG capsule Take 1 capsule (40 mg total) by mouth  every morning.   traZODone (DESYREL) 50 MG tablet Take 1 tablet (50 mg total) by mouth at bedtime.   [DISCONTINUED] amphetamine-dextroamphetamine (ADDERALL) 5 MG tablet Take 1 tablet (5 mg total) by mouth daily.   [START ON 01/07/2021] amphetamine-dextroamphetamine (ADDERALL) 5 MG tablet Take 1 tablet (5 mg total) by mouth daily.   [DISCONTINUED] HYDROcodone-acetaminophen (NORCO) 5-325 MG tablet Take 1-2 tablets by mouth every 6 (six) hours as needed. (Patient not taking: Reported on 11/24/2020)   No facility-administered encounter medications on file as of 11/24/2020.    Surgical History: Past Surgical History:  Procedure Laterality Date   CARPAL TUNNEL RELEASE Right 11/22/2019   Procedure: CARPAL TUNNEL RELEASE;  Surgeon: Earnestine Leys, MD;  Location: [facility] ORS;  Service: Orthopedics;  Laterality: Right;   COLONOSCOPY     TOE SURGERY  2005,2011   ingrown toenails in doctors office   UPPER GI ENDOSCOPY      Medical History: Past Medical History:  Diagnosis Date   ADHD    Allergy    Anxiety    Colon polyp    Diverticulosis    GERD (gastroesophageal reflux disease)    Hyperlipidemia     Family History: Family History  Problem Relation Age of Onset   Lung cancer Mother    Heart disease Father    Pancreatic cancer Sister     Social History   Socioeconomic History  Marital status: Married    Spouse name: Not on file   Number of children: Not on file   Years of education: Not on file   Highest education level: Not on file  Occupational History   Not on file  Tobacco Use   Smoking status: Never   Smokeless tobacco: Never  Vaping Use   Vaping Use: Never used  Substance and Sexual Activity   Alcohol use: Yes    Comment: ocassionally    Drug use: Yes    Types: Marijuana    Comment: daily use   Sexual activity: Not on file  Other Topics Concern   Not on file  Social History Narrative   Not on file   Social Determinants of Health   Financial Resource Strain: Not  on file  Food Insecurity: Not on file  Transportation Needs: Not on file  Physical Activity: Not on file  Stress: Not on file  Social Connections: Not on file  Intimate Partner Violence: Not on file      Review of Systems  Constitutional:  Positive for fatigue. Negative for chills and unexpected weight change.  HENT:  Negative for congestion, postnasal drip, rhinorrhea, sneezing and sore throat.   Eyes:  Negative for redness.  Respiratory:  Negative for cough, chest tightness and shortness of breath.   Cardiovascular:  Negative for chest pain and palpitations.  Gastrointestinal:  Negative for abdominal pain, constipation, diarrhea, nausea and vomiting.  Genitourinary:  Negative for dysuria and frequency.  Musculoskeletal:  Negative for arthralgias, back pain, joint swelling and neck pain.  Skin:  Negative for rash.  Neurological: Negative.  Negative for tremors and numbness.  Hematological:  Negative for adenopathy. Does not bruise/bleed easily.  Psychiatric/Behavioral:  Positive for decreased concentration and sleep disturbance. Negative for behavioral problems (Depression) and suicidal ideas. The patient is nervous/anxious.    Vital Signs: BP 140/76   Pulse 67   Temp (!) 97.3 F (36.3 C)   Resp 16   Ht 5\' 2"  (1.575 m)   Wt 176 lb (79.8 kg)   SpO2 98%   BMI 32.19 kg/m    Physical Exam Vitals and nursing note reviewed.  Constitutional:      General: She is not in acute distress.    Appearance: She is well-developed. She is obese. She is not diaphoretic.  HENT:     Head: Normocephalic and atraumatic.     Mouth/Throat:     Pharynx: No oropharyngeal exudate.  Eyes:     Pupils: Pupils are equal, round, and reactive to light.  Neck:     Thyroid: No thyromegaly.     Vascular: No JVD.     Trachea: No tracheal deviation.  Cardiovascular:     Rate and Rhythm: Normal rate and regular rhythm.     Heart sounds: Normal heart sounds. No murmur heard.   No friction rub. No  gallop.  Pulmonary:     Effort: Pulmonary effort is normal. No respiratory distress.     Breath sounds: No wheezing or rales.  Chest:     Chest wall: No tenderness.  Abdominal:     General: Bowel sounds are normal.     Palpations: Abdomen is soft.  Musculoskeletal:        General: Normal range of motion.     Cervical back: Normal range of motion and neck supple.  Lymphadenopathy:     Cervical: No cervical adenopathy.  Skin:    General: Skin is warm and dry.  Neurological:  Mental Status: She is alert and oriented to person, place, and time.     Cranial Nerves: No cranial nerve deficit.  Psychiatric:        Behavior: Behavior normal.        Thought Content: Thought content normal.        Judgment: Judgment normal.       Assessment/Plan: 1. Attention and concentration deficit May continue adderall as needed, will need UDS next visit - amphetamine-dextroamphetamine (ADDERALL) 5 MG tablet; Take 1 tablet (5 mg total) by mouth daily.  Dispense: 30 tablet; Refill: 0  2. GAD (generalized anxiety disorder) Continue Lexapro, Will add trazodone before bed to help with anxiety when trying to fall asleep. - traZODone (DESYREL) 50 MG tablet; Take 1 tablet (50 mg total) by mouth at bedtime.  Dispense: 30 tablet; Refill: 2  3. Mixed hyperlipidemia Continue fenofibrate and statin; will update labs  4. Gastroesophageal reflux disease without esophagitis Continue omeprazole   General Counseling: Jerzee verbalizes understanding of the findings of todays visit and agrees with plan of treatment. I have discussed any further diagnostic evaluation that may be needed or ordered today. We also reviewed her medications today. she has been encouraged to call the office with any questions or concerns that should arise related to todays visit.    No orders of the defined types were placed in this encounter.   Meds ordered this encounter  Medications   traZODone (DESYREL) 50 MG tablet     Sig: Take 1 tablet (50 mg total) by mouth at bedtime.    Dispense:  30 tablet    Refill:  2   amphetamine-dextroamphetamine (ADDERALL) 5 MG tablet    Sig: Take 1 tablet (5 mg total) by mouth daily.    Dispense:  30 tablet    Refill:  0    Do not fill until 01/07/21    This patient was seen by Drema Dallas, PA-C in collaboration with Dr. Clayborn Bigness as a part of collaborative care agreement.   Total time spent:30 Minutes Time spent includes review of chart, medications, test results, and follow up plan with the patient.      Dr Lavera Guise Internal medicine

## 2020-12-26 ENCOUNTER — Other Ambulatory Visit: Payer: Self-pay | Admitting: Internal Medicine

## 2020-12-26 DIAGNOSIS — F411 Generalized anxiety disorder: Secondary | ICD-10-CM

## 2020-12-27 ENCOUNTER — Other Ambulatory Visit: Payer: Self-pay | Admitting: Internal Medicine

## 2020-12-27 DIAGNOSIS — M545 Low back pain, unspecified: Secondary | ICD-10-CM

## 2021-01-19 ENCOUNTER — Telehealth: Payer: Self-pay

## 2021-01-19 ENCOUNTER — Ambulatory Visit: Payer: Managed Care, Other (non HMO) | Admitting: Physician Assistant

## 2021-01-19 NOTE — Telephone Encounter (Signed)
Patient called in to reschedule appt stated that they did not need to come to todays appt as they have one more refill on their adderall at [facility]. Patient rescheduled to come in on 02/12/2021.

## 2021-02-12 ENCOUNTER — Ambulatory Visit: Payer: Managed Care, Other (non HMO) | Admitting: Physician Assistant

## 2021-02-12 ENCOUNTER — Other Ambulatory Visit: Payer: Self-pay

## 2021-02-12 ENCOUNTER — Encounter: Payer: Self-pay | Admitting: Physician Assistant

## 2021-02-12 DIAGNOSIS — E782 Mixed hyperlipidemia: Secondary | ICD-10-CM

## 2021-02-12 DIAGNOSIS — Z23 Encounter for immunization: Secondary | ICD-10-CM | POA: Diagnosis not present

## 2021-02-12 DIAGNOSIS — K219 Gastro-esophageal reflux disease without esophagitis: Secondary | ICD-10-CM | POA: Diagnosis not present

## 2021-02-12 DIAGNOSIS — Z79899 Other long term (current) drug therapy: Secondary | ICD-10-CM | POA: Diagnosis not present

## 2021-02-12 DIAGNOSIS — Z9989 Dependence on other enabling machines and devices: Secondary | ICD-10-CM

## 2021-02-12 DIAGNOSIS — E559 Vitamin D deficiency, unspecified: Secondary | ICD-10-CM

## 2021-02-12 DIAGNOSIS — F411 Generalized anxiety disorder: Secondary | ICD-10-CM | POA: Diagnosis not present

## 2021-02-12 DIAGNOSIS — G4733 Obstructive sleep apnea (adult) (pediatric): Secondary | ICD-10-CM | POA: Diagnosis not present

## 2021-02-12 DIAGNOSIS — R41840 Attention and concentration deficit: Secondary | ICD-10-CM | POA: Diagnosis not present

## 2021-02-12 DIAGNOSIS — R5383 Other fatigue: Secondary | ICD-10-CM

## 2021-02-12 DIAGNOSIS — E538 Deficiency of other specified B group vitamins: Secondary | ICD-10-CM

## 2021-02-12 LAB — POCT URINE DRUG SCREEN
Methylenedioxyamphetamine: NOT DETECTED
POC Amphetamine UR: POSITIVE — AB
POC BENZODIAZEPINES UR: NOT DETECTED
POC Barbiturate UR: NOT DETECTED
POC Cocaine UR: NOT DETECTED
POC Ecstasy UR: NOT DETECTED
POC Marijuana UR: NOT DETECTED
POC Methadone UR: NOT DETECTED
POC Methamphetamine UR: NOT DETECTED
POC Opiate Ur: NOT DETECTED
POC Oxycodone UR: NOT DETECTED
POC PHENCYCLIDINE UR: NOT DETECTED
POC TRICYCLICS UR: NOT DETECTED

## 2021-02-12 MED ORDER — AMPHETAMINE-DEXTROAMPHETAMINE 5 MG PO TABS: 5.0000 mg | ORAL_TABLET | Freq: Every day | ORAL | 0 refills | Status: DC

## 2021-02-12 MED ORDER — TRAZODONE HCL 50 MG PO TABS: 50.0000 mg | ORAL_TABLET | Freq: Every day | ORAL | 2 refills | Status: DC

## 2021-02-12 NOTE — Progress Notes (Signed)
[facility] [place], [place] [address]  Internal MEDICINE  Office Visit Note  Patient Name: [name]  [id number]  [id number]  Date of Service: 02/13/2021  Chief Complaint  Patient presents with   Follow-up   Hyperlipidemia   Anxiety    HPI Pt is here for routine follow up -She has been taking '5mg'$  adderall most days with occasional extra 1/2 dose. Will skip some days. Denies any palpitations or changes in sleep. -last visit we tried changing from klonopin to trazodone before bed, however did not fill it and would like new script. She normally gets about 6 hours of sleep. -Ongoing nausea from time to time throughout the day, has been seen by many providers before. Has had gastric emptying study in Feb that was normal. Continues to take omeprazole and many reach back out to GI regarding this. -Wearing CPAP nightly -Due for fasting labs prior to CPE next visit  Current Medication: Outpatient Encounter Medications as of 02/12/2021  Medication Sig   atorvastatin (LIPITOR) 40 MG tablet Take 1 tablet (40 mg total) by mouth daily at 6 PM.   busPIRone (BUSPAR) 5 MG tablet Take 1 tablet po BID prn anxiety (Patient taking differently: Take 5 mg by mouth 2 (two) times daily as needed (anxiety).)   clonazePAM (KLONOPIN) 0.5 MG tablet Take 1 tablet (0.5 mg total) by mouth at bedtime as needed for anxiety.   COVID-19 mRNA vaccine, Pfizer, 30 MCG/0.3ML injection USE AS DIRECTED   escitalopram (LEXAPRO) 20 MG tablet TAKE 1 TABLET BY MOUTH  DAILY   fenofibrate 54 MG tablet Take 1 tablet (54 mg total) by mouth daily.   fluticasone (FLONASE) 50 MCG/ACT nasal spray Place 2 sprays into both nostrils daily.   meloxicam (MOBIC) 15 MG tablet TAKE 1 TABLET BY MOUTH AT  BEDTIME   omeprazole (PRILOSEC) 40 MG capsule Take 1 capsule (40 mg total) by mouth every morning.   [DISCONTINUED] amphetamine-dextroamphetamine (ADDERALL) 5 MG tablet Take 1 tablet (5 mg total) by mouth daily  with breakfast.   [DISCONTINUED] amphetamine-dextroamphetamine (ADDERALL) 5 MG tablet Take 1 tablet (5 mg total) by mouth daily.   [DISCONTINUED] traZODone (DESYREL) 50 MG tablet Take 1 tablet (50 mg total) by mouth at bedtime.   amphetamine-dextroamphetamine (ADDERALL) 5 MG tablet Take 1 tablet (5 mg total) by mouth daily with breakfast.   [START ON 03/14/2021] amphetamine-dextroamphetamine (ADDERALL) 5 MG tablet Take 1 tablet (5 mg total) by mouth daily.   traZODone (DESYREL) 50 MG tablet Take 1 tablet (50 mg total) by mouth at bedtime.   No facility-administered encounter medications on file as of 02/12/2021.    Surgical History: Past Surgical History:  Procedure Laterality Date   CARPAL TUNNEL RELEASE Right 11/22/2019   Procedure: CARPAL TUNNEL RELEASE;  Surgeon: Earnestine Leys, MD;  Location: [facility] ORS;  Service: Orthopedics;  Laterality: Right;   COLONOSCOPY     TOE SURGERY  2005,2011   ingrown toenails in doctors office   UPPER GI ENDOSCOPY      Medical History: Past Medical History:  Diagnosis Date   ADHD    Allergy    Anxiety    Colon polyp    Diverticulosis    GERD (gastroesophageal reflux disease)    Hyperlipidemia     Family History: Family History  Problem Relation Age of Onset   Lung cancer Mother    Heart disease Father    Pancreatic cancer Sister     Social History   Socioeconomic History  Marital status: Married    Spouse name: Not on file   Number of children: Not on file   Years of education: Not on file   Highest education level: Not on file  Occupational History   Not on file  Tobacco Use   Smoking status: Never   Smokeless tobacco: Never  Vaping Use   Vaping Use: Never used  Substance and Sexual Activity   Alcohol use: Yes    Comment: ocassionally    Drug use: Yes    Types: Marijuana    Comment: daily use   Sexual activity: Not on file  Other Topics Concern   Not on file  Social History Narrative   Not on file   Social  Determinants of Health   Financial Resource Strain: Not on file  Food Insecurity: Not on file  Transportation Needs: Not on file  Physical Activity: Not on file  Stress: Not on file  Social Connections: Not on file  Intimate Partner Violence: Not on file      Review of Systems  Constitutional:  Positive for fatigue. Negative for chills and unexpected weight change.  HENT:  Negative for congestion, postnasal drip, rhinorrhea, sneezing and sore throat.   Eyes:  Negative for redness.  Respiratory:  Negative for cough, chest tightness and shortness of breath.   Cardiovascular:  Negative for chest pain and palpitations.  Gastrointestinal:  Positive for nausea. Negative for abdominal pain, constipation, diarrhea and vomiting.  Genitourinary:  Negative for dysuria and frequency.  Musculoskeletal:  Negative for arthralgias, back pain, joint swelling and neck pain.  Skin:  Negative for rash.  Neurological: Negative.  Negative for tremors and numbness.  Hematological:  Negative for adenopathy. Does not bruise/bleed easily.  Psychiatric/Behavioral:  Positive for decreased concentration and sleep disturbance. Negative for behavioral problems (Depression) and suicidal ideas. The patient is nervous/anxious.    Vital Signs: BP 140/80   Pulse (!) 56   Temp 97.8 F (36.6 C)   Resp 16   Ht '5\' 2"'$  (1.575 m)   Wt 180 lb (81.6 kg)   SpO2 98%   BMI 32.92 kg/m    Physical Exam Vitals and nursing note reviewed.  Constitutional:      General: She is not in acute distress.    Appearance: She is well-developed. She is obese. She is not diaphoretic.  HENT:     Head: Normocephalic and atraumatic.     Mouth/Throat:     Pharynx: No oropharyngeal exudate.  Eyes:     Pupils: Pupils are equal, round, and reactive to light.  Neck:     Thyroid: No thyromegaly.     Vascular: No JVD.     Trachea: No tracheal deviation.  Cardiovascular:     Rate and Rhythm: Normal rate and regular rhythm.     Heart  sounds: Normal heart sounds. No murmur heard.   No friction rub. No gallop.  Pulmonary:     Effort: Pulmonary effort is normal. No respiratory distress.     Breath sounds: No wheezing or rales.  Chest:     Chest wall: No tenderness.  Abdominal:     General: Bowel sounds are normal.     Palpations: Abdomen is soft.  Musculoskeletal:        General: Normal range of motion.     Cervical back: Normal range of motion and neck supple.  Lymphadenopathy:     Cervical: No cervical adenopathy.  Skin:    General: Skin is warm and dry.  Neurological:     Mental Status: She is alert and oriented to person, place, and time.     Cranial Nerves: No cranial nerve deficit.  Psychiatric:        Behavior: Behavior normal.        Thought Content: Thought content normal.        Judgment: Judgment normal.       Assessment/Plan: 1. Attention and concentration deficit May continue adderall prn - amphetamine-dextroamphetamine (ADDERALL) 5 MG tablet; Take 1 tablet (5 mg total) by mouth daily with breakfast.  Dispense: 30 tablet; Refill: 0 - amphetamine-dextroamphetamine (ADDERALL) 5 MG tablet; Take 1 tablet (5 mg total) by mouth daily.  Dispense: 30 tablet; Refill: 0 [place] Controlled Substance Database was reviewed by me for overdose risk score (ORS) Refilled Controlled medications today. Reviewed risks and possible side effects associated with taking Stimulants. Combination of these drugs with other psychotropic medications could cause dizziness and drowsiness. Pt needs to Monitor symptoms and exercise caution in driving and operating heavy machinery to avoid damages to oneself, to others and to the surroundings. Patient verbalized understanding in this matter. Dependence and abuse for these drugs will be monitored closely. A Controlled substance policy and procedure is on file which allows [facility] to order a urine drug screen test at any visit. Patient understands and agrees with the plan..     2. GAD (generalized anxiety disorder) Will try taking trazodone 1/2 tab to full tab prior to bed. - traZODone (DESYREL) 50 MG tablet; Take 1 tablet (50 mg total) by mouth at bedtime.  Dispense: 30 tablet; Refill: 2  3. OSA on CPAP Continue CPAP  4. Gastroesophageal reflux disease without esophagitis Continue omeprazole and f/u with GI  5. Encounter for long-term (current) use of medications - POCT Urine Drug Screen  6. Flu vaccine need - Flu Vaccine MDCK QUAD PF  7. Mixed hyperlipidemia - Lipid Panel With LDL/HDL Ratio  8. B12 deficiency - B12 and Folate Panel  9. Vitamin D deficiency - VITAMIN D 25 Hydroxy (Vit-D Deficiency, Fractures)  10. Other fatigue - TSH + free T4 - Comprehensive metabolic panel - CBC w/Diff/Platelet   General Counseling: Yailyn verbalizes understanding of the findings of todays visit and agrees with plan of treatment. I have discussed any further diagnostic evaluation that may be needed or ordered today. We also reviewed her medications today. she has been encouraged to call the office with any questions or concerns that should arise related to todays visit.    Orders Placed This Encounter  Procedures   Flu Vaccine MDCK QUAD PF   VITAMIN D 25 Hydroxy (Vit-D Deficiency, Fractures)   B12 and Folate Panel   TSH + free T4   Lipid Panel With LDL/HDL Ratio   Comprehensive metabolic panel   CBC w/Diff/Platelet   POCT Urine Drug Screen    Meds ordered this encounter  Medications   traZODone (DESYREL) 50 MG tablet    Sig: Take 1 tablet (50 mg total) by mouth at bedtime.    Dispense:  30 tablet    Refill:  2   amphetamine-dextroamphetamine (ADDERALL) 5 MG tablet    Sig: Take 1 tablet (5 mg total) by mouth daily with breakfast.    Dispense:  30 tablet    Refill:  0   amphetamine-dextroamphetamine (ADDERALL) 5 MG tablet    Sig: Take 1 tablet (5 mg total) by mouth daily.    Dispense:  30 tablet    Refill:  0  Do not fill until 10/12//22     This patient was seen by Drema Dallas, PA-C in collaboration with Dr. Clayborn Bigness as a part of collaborative care agreement.   Total time spent:35 Minutes Time spent includes review of chart, medications, test results, and follow up plan with the patient.      Dr Lavera Guise Internal medicine

## 2021-02-14 ENCOUNTER — Other Ambulatory Visit: Payer: Self-pay | Admitting: Physician Assistant

## 2021-02-14 DIAGNOSIS — Z1231 Encounter for screening mammogram for malignant neoplasm of breast: Secondary | ICD-10-CM

## 2021-03-02 ENCOUNTER — Ambulatory Visit
Admission: RE | Admit: 2021-03-02 | Discharge: 2021-03-02 | Disposition: A | Discharge status: Home / Self Care | Payer: Managed Care, Other (non HMO) | Source: Ambulatory Visit | Attending: Physician Assistant | Admitting: Physician Assistant

## 2021-03-02 ENCOUNTER — Other Ambulatory Visit: Payer: Self-pay

## 2021-03-02 DIAGNOSIS — Z1231 Encounter for screening mammogram for malignant neoplasm of breast: Secondary | ICD-10-CM | POA: Insufficient documentation

## 2021-03-10 LAB — LIPID PANEL WITH LDL/HDL RATIO
Cholesterol, Total: 131 mg/dL (ref 100–199)
HDL: 48 mg/dL (ref >39)
LDL Chol Calc (NIH): 65 mg/dL (ref 0–99)
LDL/HDL Ratio: 1.4 ratio (ref 0.0–3.2)
Triglycerides: 93 mg/dL (ref 0–149)
VLDL Cholesterol Cal: 18 mg/dL (ref 5–40)

## 2021-03-10 LAB — CBC WITH DIFFERENTIAL/PLATELET
Basophils Absolute: 0 10*3/uL (ref 0.0–0.2)
Basos: 1 %
EOS (ABSOLUTE): 0.3 10*3/uL (ref 0.0–0.4)
Eos: 4 %
Hematocrit: 37.7 % (ref 34.0–46.6)
Hemoglobin: 12.8 g/dL (ref 11.1–15.9)
Immature Grans (Abs): 0 10*3/uL (ref 0.0–0.1)
Immature Granulocytes: 1 %
Lymphocytes Absolute: 2.4 10*3/uL (ref 0.7–3.1)
Lymphs: 41 %
MCH: 31.2 pg (ref 26.6–33.0)
MCHC: 34 g/dL (ref 31.5–35.7)
MCV: 92 fL (ref 79–97)
Monocytes Absolute: 0.4 10*3/uL (ref 0.1–0.9)
Monocytes: 8 %
Neutrophils Absolute: 2.6 10*3/uL (ref 1.4–7.0)
Neutrophils: 45 %
Platelets: 297 10*3/uL (ref 150–450)
RBC: 4.1 x10E6/uL (ref 3.77–5.28)
RDW: 12.3 % (ref 11.7–15.4)
WBC: 5.8 10*3/uL (ref 3.4–10.8)

## 2021-03-10 LAB — COMPREHENSIVE METABOLIC PANEL
ALT: 14 IU/L (ref 0–32)
AST: 19 IU/L (ref 0–40)
Albumin/Globulin Ratio: 2 (ref 1.2–2.2)
Albumin: 4.5 g/dL (ref 3.8–4.8)
Alkaline Phosphatase: 67 IU/L (ref 44–121)
BUN/Creatinine Ratio: 24 (ref 12–28)
BUN: 18 mg/dL (ref 8–27)
Bilirubin Total: 0.3 mg/dL (ref 0.0–1.2)
CO2: 24 mmol/L (ref 20–29)
Calcium: 9.3 mg/dL (ref 8.7–10.3)
Chloride: 101 mmol/L (ref 96–106)
Creatinine, Ser: 0.76 mg/dL (ref 0.57–1.00)
Globulin, Total: 2.2 g/dL (ref 1.5–4.5)
Glucose: 87 mg/dL (ref 70–99)
Potassium: 4.6 mmol/L (ref 3.5–5.2)
Sodium: 139 mmol/L (ref 134–144)
Total Protein: 6.7 g/dL (ref 6.0–8.5)
eGFR: 87 mL/min/{1.73_m2} (ref >59)

## 2021-03-10 LAB — VITAMIN D 25 HYDROXY (VIT D DEFICIENCY, FRACTURES): Vit D, 25-Hydroxy: 26.6 ng/mL — ABNORMAL LOW (ref 30.0–100.0)

## 2021-03-10 LAB — B12 AND FOLATE PANEL
Folate: 13.5 ng/mL (ref >3.0)
Vitamin B-12: 244 pg/mL (ref 232–1245)

## 2021-03-10 LAB — TSH+FREE T4
Free T4: 1.08 ng/dL (ref 0.82–1.77)
TSH: 0.56 u[IU]/mL (ref 0.450–4.500)

## 2021-03-23 ENCOUNTER — Ambulatory Visit (INDEPENDENT_AMBULATORY_CARE_PROVIDER_SITE_OTHER): Payer: Managed Care, Other (non HMO) | Admitting: Nurse Practitioner

## 2021-03-23 ENCOUNTER — Encounter: Payer: Self-pay | Admitting: Nurse Practitioner

## 2021-03-23 ENCOUNTER — Other Ambulatory Visit: Payer: Self-pay

## 2021-03-23 VITALS — Resp 16 | Ht 62.0 in | Wt 176.0 lb

## 2021-03-23 DIAGNOSIS — E538 Deficiency of other specified B group vitamins: Secondary | ICD-10-CM | POA: Diagnosis not present

## 2021-03-23 DIAGNOSIS — R110 Nausea: Secondary | ICD-10-CM

## 2021-03-23 DIAGNOSIS — Z23 Encounter for immunization: Secondary | ICD-10-CM

## 2021-03-23 DIAGNOSIS — L03113 Cellulitis of right upper limb: Secondary | ICD-10-CM | POA: Diagnosis not present

## 2021-03-23 DIAGNOSIS — W5501XD Bitten by cat, subsequent encounter: Secondary | ICD-10-CM

## 2021-03-23 DIAGNOSIS — G8929 Other chronic pain: Secondary | ICD-10-CM

## 2021-03-23 DIAGNOSIS — M545 Low back pain, unspecified: Secondary | ICD-10-CM | POA: Diagnosis not present

## 2021-03-23 DIAGNOSIS — Z780 Asymptomatic menopausal state: Secondary | ICD-10-CM | POA: Diagnosis not present

## 2021-03-23 DIAGNOSIS — Z0001 Encounter for general adult medical examination with abnormal findings: Secondary | ICD-10-CM

## 2021-03-23 DIAGNOSIS — F411 Generalized anxiety disorder: Secondary | ICD-10-CM

## 2021-03-23 DIAGNOSIS — B3731 Acute candidiasis of vulva and vagina: Secondary | ICD-10-CM

## 2021-03-23 DIAGNOSIS — R300 Dysuria: Secondary | ICD-10-CM

## 2021-03-23 MED ORDER — FLUCONAZOLE 150 MG PO TABS
150.0000 mg | ORAL_TABLET | Freq: Once | ORAL | 0 refills | Status: AC
Start: 2021-03-23 — End: 2021-03-23

## 2021-03-23 MED ORDER — ONDANSETRON HCL 4 MG PO TABS: 4.0000 mg | ORAL_TABLET | Freq: Three times a day (TID) | ORAL | 0 refills | Status: DC | PRN

## 2021-03-23 MED ORDER — MOXIFLOXACIN HCL 400 MG PO TABS: 400.0000 mg | ORAL_TABLET | Freq: Every day | ORAL | 0 refills | Status: AC

## 2021-03-23 MED ORDER — CYANOCOBALAMIN 1000 MCG/ML IJ SOLN
1000.0000 ug | Freq: Once | INTRAMUSCULAR | Status: AC
  Administered 2021-03-23: 1000 ug via INTRAMUSCULAR

## 2021-03-23 MED ORDER — ZOSTER VAC RECOMB ADJUVANTED 50 MCG/0.5ML IM SUSR: 0.5000 mL | Freq: Once | INTRAMUSCULAR | 0 refills | Status: AC

## 2021-03-23 MED ORDER — PNEUMOCOCCAL 20-VAL CONJ VACC 0.5 ML IM SUSY: 0.5000 mL | PREFILLED_SYRINGE | INTRAMUSCULAR | 0 refills | Status: AC

## 2021-03-23 MED ORDER — CLONAZEPAM 0.5 MG PO TABS: 0.5000 mg | ORAL_TABLET | Freq: Every evening | ORAL | 1 refills | Status: DC | PRN

## 2021-03-23 MED ORDER — IBUPROFEN 800 MG PO TABS: 800.0000 mg | ORAL_TABLET | Freq: Three times a day (TID) | ORAL | 0 refills | Status: DC

## 2021-03-23 NOTE — Progress Notes (Signed)
[facility] [place], [place] [address]  Internal MEDICINE  Office Visit Note  Patient Name: [name]  [id number]  [id number]  Date of Service: 03/23/2021  Chief Complaint  Patient presents with   Annual Exam   Animal Bite    03/19/21 went to [facility] urgent care, given Amox/clav 875 mg feels like its not working.  Bite on right forearm, Still red and very sore, has some green mucus coming out of area     Quality Metric Gaps    Pneumonia vacc, Shingrix vacc, HIV screen, Hep C screen, Dexa Scan   Medication Problem    Sleep medication is to strong    HPI [name] presents for an annual well visit and physical exam. She has OSA on CPAP, GERD, low back pain. She is due for multiple vaccinations. She is due for her BMD screening. She had her mammogram in September and the result was BI-RADS category 1 negative.  Her lipid panel is improved.  She is doing well with her CPAP, denies fatigue, or trouble sleeping, and she does not need any supplies.  She was bitten by a feral cat on the right forearm, it is sore, red and has green drainage. She is taking augmentin after going to urgent care on 10/17.  Labs are within normal limits except for a slightly low vitamin D level. Patient encouraged to take OTC vitamin D of 2000 - 5000 units daily.     Current Medication: Outpatient Encounter Medications as of 03/23/2021  Medication Sig   amphetamine-dextroamphetamine (ADDERALL) 5 MG tablet Take 1 tablet (5 mg total) by mouth daily with breakfast.   amphetamine-dextroamphetamine (ADDERALL) 5 MG tablet Take 1 tablet (5 mg total) by mouth daily.   atorvastatin (LIPITOR) 40 MG tablet Take 1 tablet (40 mg total) by mouth daily at 6 PM.   busPIRone (BUSPAR) 5 MG tablet Take 1 tablet po BID prn anxiety (Patient taking differently: Take 5 mg by mouth 2 (two) times daily as needed (anxiety).)   clonazePAM (KLONOPIN) 0.5 MG tablet Take 1 tablet (0.5 mg total) by mouth at  bedtime as needed for anxiety.   COVID-19 mRNA vaccine, Pfizer, 30 MCG/0.3ML injection USE AS DIRECTED   escitalopram (LEXAPRO) 20 MG tablet TAKE 1 TABLET BY MOUTH  DAILY   fenofibrate 54 MG tablet Take 1 tablet (54 mg total) by mouth daily.   fluconazole (DIFLUCAN) 150 MG tablet Take 1 tablet (150 mg total) by mouth once for 1 dose. May take additional dose x2 after 72 hours if still symptomatic.   fluticasone (FLONASE) 50 MCG/ACT nasal spray Place 2 sprays into both nostrils daily.   ibuprofen (ADVIL) 800 MG tablet Take 1 tablet (800 mg total) by mouth 3 (three) times daily.   meloxicam (MOBIC) 15 MG tablet TAKE 1 TABLET BY MOUTH AT  BEDTIME   moxifloxacin (AVELOX) 400 MG tablet Take 1 tablet (400 mg total) by mouth daily for 14 days.   omeprazole (PRILOSEC) 40 MG capsule Take 1 capsule (40 mg total) by mouth every morning.   ondansetron (ZOFRAN) 4 MG tablet Take 1 tablet (4 mg total) by mouth every 8 (eight) hours as needed for nausea or vomiting.   traZODone (DESYREL) 50 MG tablet Take 1 tablet (50 mg total) by mouth at bedtime.   [DISCONTINUED] pneumococcal 20-valent conjugate vaccine (PREVNAR 20) 0.5 ML injection Inject 0.5 mLs into the muscle tomorrow at 10 am.   [DISCONTINUED] Zoster Vaccine Adjuvanted [facility]) injection Inject 0.5 mLs  into the muscle once.   pneumococcal 20-valent conjugate vaccine (PREVNAR 20) 0.5 ML injection Inject 0.5 mLs into the muscle tomorrow at 10 am for 1 dose.   Zoster Vaccine Adjuvanted [facility]) injection Inject 0.5 mLs into the muscle once for 1 dose.   Facility-Administered Encounter Medications as of 03/23/2021  Medication   cyanocobalamin ((VITAMIN B-12)) injection 1,000 mcg    Surgical History: Past Surgical History:  Procedure Laterality Date   CARPAL TUNNEL RELEASE Right 11/22/2019   Procedure: CARPAL TUNNEL RELEASE;  Surgeon: Earnestine Leys, MD;  Location: [facility] ORS;  Service: Orthopedics;  Laterality: Right;   COLONOSCOPY     TOE SURGERY   2005,2011   ingrown toenails in doctors office   UPPER GI ENDOSCOPY      Medical History: Past Medical History:  Diagnosis Date   ADHD    Allergy    Anxiety    Colon polyp    Diverticulosis    GERD (gastroesophageal reflux disease)    Hyperlipidemia     Family History: Family History  Problem Relation Age of Onset   Lung cancer Mother    Heart disease Father    Pancreatic cancer Sister     Social History   Socioeconomic History   Marital status: Married    Spouse name: Not on file   Number of children: Not on file   Years of education: Not on file   Highest education level: Not on file  Occupational History   Not on file  Tobacco Use   Smoking status: Never   Smokeless tobacco: Never  Vaping Use   Vaping Use: Never used  Substance and Sexual Activity   Alcohol use: Yes    Comment: ocassionally    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  HENT: Negative.  Negative for congestion, ear pain, rhinorrhea, sore throat and trouble swallowing.   Eyes: Negative.   Respiratory: Negative.  Negative for cough, chest tightness, shortness of breath and wheezing.   Cardiovascular: Negative.  Negative for chest pain.  Gastrointestinal: Negative.  Negative for abdominal pain, blood in stool, constipation, diarrhea, nausea and vomiting.  Endocrine: Negative.   Genitourinary: Negative.  Negative for difficulty urinating, dysuria, frequency,  hematuria and urgency.  Musculoskeletal: Negative.  Negative for arthralgias, back pain, joint swelling, myalgias and neck pain.  Skin:  Positive for wound. Negative for rash.  Allergic/Immunologic: Negative.  Negative for immunocompromised state.  Neurological: Negative.  Negative for dizziness, seizures, numbness and headaches.  Hematological: Negative.   Psychiatric/Behavioral: Negative.  Negative for behavioral problems, self-injury and suicidal ideas. The patient is not nervous/anxious.    Vital Signs: Resp 16   Ht 5\' 2"  (1.575 m)   Wt 176 lb (79.8 kg)   BMI 32.19 kg/m    Physical Exam Vitals reviewed.  Constitutional:      General: She is not in acute distress.  Appearance: She is well-developed. She is not diaphoretic.  HENT:     Head: Normocephalic and atraumatic.     Right Ear: Tympanic membrane, ear canal and external ear normal.     Left Ear: Tympanic membrane, ear canal and external ear normal.     Nose: Nose normal. No congestion or rhinorrhea.     Mouth/Throat:     Mouth: Mucous membranes are moist.     Pharynx: Oropharynx is clear. No oropharyngeal exudate or posterior oropharyngeal erythema.  Eyes:     General: No scleral icterus.       Right eye: No discharge.        Left eye: No discharge.     Extraocular Movements: Extraocular movements intact.     Conjunctiva/sclera: Conjunctivae normal.     Pupils: Pupils are equal, round, and reactive to light.  Neck:     Thyroid: No thyromegaly.     Vascular: No JVD.     Trachea: No tracheal deviation.  Cardiovascular:     Rate and Rhythm: Normal rate and regular rhythm.     Heart sounds: Normal heart sounds. No murmur heard.   No friction rub. No gallop.  Pulmonary:     Effort: Pulmonary effort is normal. No respiratory distress.     Breath sounds: Normal breath sounds. No stridor. No wheezing or rales.  Chest:     Chest wall: No tenderness.  Abdominal:     General: Bowel sounds are normal. There is no  distension.     Palpations: Abdomen is soft. There is no mass.     Tenderness: There is no abdominal tenderness. There is no guarding or rebound.  Musculoskeletal:        General: No tenderness or deformity. Normal range of motion.     Cervical back: Normal range of motion and neck supple.  Lymphadenopathy:     Cervical: No cervical adenopathy.  Skin:    General: Skin is warm and dry.     Coloration: Skin is not pale.     Findings: No erythema or rash.     Comments: Right forearm is red, edematous, tender and hot to the touch. There is no drainage presents at this time. The edema is firm but pitting, the puncture wounds have a yellow wound bed and look irritated and infected.   Neurological:     Mental Status: She is alert and oriented to person, place, and time.     Cranial Nerves: No cranial nerve deficit.     Motor: No abnormal muscle tone.     Coordination: Coordination normal.     Deep Tendon Reflexes: Reflexes are normal and symmetric.  Psychiatric:        Behavior: Behavior normal.        Thought Content: Thought content normal.        Judgment: Judgment normal.       Assessment/Plan: 1. Encounter for general adult medical examination with abnormal findings Age-appropriate preventive screenings and vaccinations discussed, annual physical exam completed. Routine labs for health maintenance done prior to today's visit and results were discussed today.Marland Kitchen PHM updated.   2. Cellulitis of right forearm Cellulitis due to cat bite, taking augmentin but the infection seems to be getting worse, additional antibiotic prescribed - moxifloxacin (AVELOX) 400 MG tablet; Take 1 tablet (400 mg total) by mouth daily for 14 days.  Dispense: 14 tablet; Refill: 0  3. Asymptomatic postmenopausal state Bone density scan ordered.  - DG Bone Density; Future  4.  Vulvovaginal candidiasis Fluconazole prescribed for patient to take along with antibiotic. Patient reports that antibiotics usually  cause a yeast infection per patient report.  - fluconazole (DIFLUCAN) 150 MG tablet; Take 1 tablet (150 mg total) by mouth once for 1 dose. May take additional dose x2 after 72 hours if still symptomatic.  Dispense: 3 tablet; Refill: 0  5. Chronic bilateral low back pain without sciatica Ibuprofen refill ordered - ibuprofen (ADVIL) 800 MG tablet; Take 1 tablet (800 mg total) by mouth 3 (three) times daily.  Dispense: 45 tablet; Refill: 0  6. B12 deficiency B12 injection administered in office today - cyanocobalamin ((VITAMIN B-12)) injection 1,000 mcg  7. Nausea Refill ordered - ondansetron (ZOFRAN) 4 MG tablet; Take 1 tablet (4 mg total) by mouth every 8 (eight) hours as needed for nausea or vomiting.  Dispense: 30 tablet; Refill: 0  8. GAD (generalized anxiety disorder) Refill ordered - clonazePAM (KLONOPIN) 0.5 MG tablet; Take 1 tablet (0.5 mg total) by mouth at bedtime as needed for anxiety.  Dispense: 30 tablet; Refill: 1  9. Need for vaccination - pneumococcal 20-valent conjugate vaccine (PREVNAR 20) 0.5 ML injection; Inject 0.5 mLs into the muscle tomorrow at 10 am for 1 dose.  Dispense: 0.5 mL; Refill: 0 - Zoster Vaccine Adjuvanted [facility]) injection; Inject 0.5 mLs into the muscle once for 1 dose.  Dispense: 0.5 mL; Refill: 0  10. Dysuria Routine urinalysis done - UA/M w/rflx Culture, Routine - Microscopic Examination     General Counseling: [name] verbalizes understanding of the findings of todays visit and agrees with plan of treatment. I have discussed any further diagnostic evaluation that may be needed or ordered today. We also reviewed her medications today. she has been encouraged to call the office with any questions or concerns that should arise related to todays visit.    Orders Placed This Encounter  Procedures   DG Bone Density   UA/M w/rflx Culture, Routine    Meds ordered this encounter  Medications   pneumococcal 20-valent conjugate vaccine (PREVNAR  20) 0.5 ML injection    Sig: Inject 0.5 mLs into the muscle tomorrow at 10 am for 1 dose.    Dispense:  0.5 mL    Refill:  0   Zoster Vaccine Adjuvanted [facility]) injection    Sig: Inject 0.5 mLs into the muscle once for 1 dose.    Dispense:  0.5 mL    Refill:  0   moxifloxacin (AVELOX) 400 MG tablet    Sig: Take 1 tablet (400 mg total) by mouth daily for 14 days.    Dispense:  14 tablet    Refill:  0   ibuprofen (ADVIL) 800 MG tablet    Sig: Take 1 tablet (800 mg total) by mouth 3 (three) times daily.    Dispense:  45 tablet    Refill:  0   ondansetron (ZOFRAN) 4 MG tablet    Sig: Take 1 tablet (4 mg total) by mouth every 8 (eight) hours as needed for nausea or vomiting.    Dispense:  30 tablet    Refill:  0   fluconazole (DIFLUCAN) 150 MG tablet    Sig: Take 1 tablet (150 mg total) by mouth once for 1 dose. May take additional dose x2 after 72 hours if still symptomatic.    Dispense:  3 tablet    Refill:  0   cyanocobalamin ((VITAMIN B-12)) injection 1,000 mcg    Return in about 2 weeks (around 04/06/2021) for F/U reassess  cate bite wound infection .   Total time spent:30 Minutes Time spent includes review of chart, medications, test results, and follow up plan with the patient.   [place] Controlled Substance Database was reviewed by me.  This patient was seen by Jonetta Osgood, FNP-C in collaboration with Dr. Clayborn Bigness as a part of collaborative care agreement.  Aireana Ryland R. Valetta Fuller, MSN, FNP-C Internal medicine

## 2021-03-24 LAB — UA/M W/RFLX CULTURE, ROUTINE
Bilirubin, UA: NEGATIVE
Glucose, UA: NEGATIVE
Ketones, UA: NEGATIVE
Leukocytes,UA: NEGATIVE
Nitrite, UA: NEGATIVE
Protein,UA: NEGATIVE
RBC, UA: NEGATIVE
Specific Gravity, UA: 1.016 (ref 1.005–1.030)
Urobilinogen, Ur: 0.2 mg/dL (ref 0.2–1.0)
pH, UA: 5.5 (ref 5.0–7.5)

## 2021-03-24 LAB — MICROSCOPIC EXAMINATION
Bacteria, UA: NONE SEEN
Casts: NONE SEEN /lpf
Epithelial Cells (non renal): NONE SEEN /hpf (ref 0–10)
WBC, UA: NONE SEEN /hpf (ref 0–5)

## 2021-03-26 ENCOUNTER — Encounter: Payer: Managed Care, Other (non HMO) | Admitting: Nurse Practitioner

## 2021-04-06 ENCOUNTER — Ambulatory Visit: Payer: Managed Care, Other (non HMO) | Admitting: Nurse Practitioner

## 2021-04-06 ENCOUNTER — Other Ambulatory Visit: Payer: Self-pay

## 2021-04-06 ENCOUNTER — Encounter: Payer: Self-pay | Admitting: Nurse Practitioner

## 2021-04-06 VITALS — BP 119/73 | HR 75 | Temp 97.0°F | Resp 16 | Ht 62.0 in | Wt 174.4 lb

## 2021-04-06 DIAGNOSIS — L03113 Cellulitis of right upper limb: Secondary | ICD-10-CM

## 2021-04-06 DIAGNOSIS — E538 Deficiency of other specified B group vitamins: Secondary | ICD-10-CM | POA: Diagnosis not present

## 2021-04-06 MED ORDER — CYANOCOBALAMIN 1000 MCG/ML IJ SOLN
1000.0000 ug | Freq: Once | INTRAMUSCULAR | Status: AC
  Administered 2021-04-06: 1000 ug via INTRAMUSCULAR

## 2021-04-06 NOTE — Progress Notes (Signed)
[facility] [place], [place] [address]  Internal MEDICINE  Office Visit Note  Patient Name: [name]  [id number]  [id number]  Date of Service: 05/05/2021  Chief Complaint  Patient presents with   Follow-up    Cat bite, right arm     HPI [name] presents for a follow up visit for cellulitis of the right forearm. At her previous office visit, she had a severe cat bite that had become infected and developing cellulitis was noted in the right forearm. She was on 1 antibiotic and a second antibiotic was added to help the infection resolve. Cat bites can cause severe infections so they must be treated promptly. She is no longer letting the stray cat in her house. The cellulitis has resolved and her arm has significantly improved. She has completed both courses of antibiotics.  She is due for B12 injection today, 1 more next week and then she can get a B12 injection monthly.     Current Medication: Outpatient Encounter Medications as of 04/06/2021  Medication Sig   amphetamine-dextroamphetamine (ADDERALL) 5 MG tablet Take 1 tablet (5 mg total) by mouth daily with breakfast.   amphetamine-dextroamphetamine (ADDERALL) 5 MG tablet Take 1 tablet (5 mg total) by mouth daily.   busPIRone (BUSPAR) 5 MG tablet Take 1 tablet po BID prn anxiety (Patient taking differently: Take 5 mg by mouth 2 (two) times daily as needed (anxiety).)   clonazePAM (KLONOPIN) 0.5 MG tablet Take 1 tablet (0.5 mg total) by mouth at bedtime as needed for anxiety.   COVID-19 mRNA vaccine, Pfizer, 30 MCG/0.3ML injection USE AS DIRECTED   escitalopram (LEXAPRO) 20 MG tablet TAKE 1 TABLET BY MOUTH  DAILY   fenofibrate 54 MG tablet Take 1 tablet (54 mg total) by mouth daily.   fluticasone (FLONASE) 50 MCG/ACT nasal spray Place 2 sprays into both nostrils daily.   ibuprofen (ADVIL) 800 MG tablet Take 1 tablet (800 mg total) by mouth 3 (three) times daily.   meloxicam (MOBIC) 15 MG tablet TAKE 1  TABLET BY MOUTH AT  BEDTIME   [EXPIRED] moxifloxacin (AVELOX) 400 MG tablet Take 1 tablet (400 mg total) by mouth daily for 14 days.   omeprazole (PRILOSEC) 40 MG capsule Take 1 capsule (40 mg total) by mouth every morning.   ondansetron (ZOFRAN) 4 MG tablet Take 1 tablet (4 mg total) by mouth every 8 (eight) hours as needed for nausea or vomiting.   [DISCONTINUED] atorvastatin (LIPITOR) 40 MG tablet Take 1 tablet (40 mg total) by mouth daily at 6 PM.   Facility-Administered Encounter Medications as of 04/06/2021  Medication   [COMPLETED] cyanocobalamin ((VITAMIN B-12)) injection 1,000 mcg    Surgical History: Past Surgical History:  Procedure Laterality Date   CARPAL TUNNEL RELEASE Right 11/22/2019   Procedure: CARPAL TUNNEL RELEASE;  Surgeon: Earnestine Leys, MD;  Location: [facility] ORS;  Service: Orthopedics;  Laterality: Right;   COLONOSCOPY     TOE SURGERY  2005,2011   ingrown toenails in doctors office   UPPER GI ENDOSCOPY      Medical History: Past Medical History:  Diagnosis Date   ADHD    Allergy    Anxiety    Colon polyp    Diverticulosis    GERD (gastroesophageal reflux disease)    Hyperlipidemia     Family History: Family History  Problem Relation Age of Onset   Lung cancer Mother    Heart disease Father    Pancreatic cancer Sister  Social History   Socioeconomic History   Marital status: Married    Spouse name: Not on file   Number of children: Not on file   Years of education: Not on file   Highest education level: Not on file  Occupational History   Not on file  Tobacco Use   Smoking status: Never   Smokeless tobacco: Never  Vaping Use   Vaping Use: Never used  Substance and Sexual Activity   Alcohol use: Yes    Comment: ocassionally    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  HENT:  Negative for congestion, rhinorrhea, sneezing and sore throat.   Eyes:  Negative for redness.  Respiratory:  Negative for cough, chest tightness and shortness of breath.   Cardiovascular:  Negative for chest pain and palpitations.  Gastrointestinal:  Negative for abdominal pain, constipation, diarrhea, nausea and vomiting.  Genitourinary:  Negative for dysuria and frequency.  Musculoskeletal:  Negative for arthralgias, back pain, joint swelling and neck pain.  Skin:  Negative for rash.  Neurological: Negative.  Negative for tremors and numbness.  Hematological:  Negative for adenopathy. Does not bruise/bleed easily.  Psychiatric/Behavioral:  Negative for behavioral problems (Depression), sleep disturbance and suicidal ideas. The patient is not nervous/anxious.    Vital Signs: BP 119/73   Pulse 75   Temp (!) 97 F (36.1 C)   Resp 16   Ht 5\' 2"  (1.575 m)   Wt 174 lb 6.4 oz (79.1 kg)   SpO2 97%   BMI 31.90 kg/m    Physical Exam Vitals reviewed.  Constitutional:      General: She is not in acute distress.    Appearance: Normal appearance. She is obese. She is not ill-appearing.  HENT:     Head: Normocephalic and atraumatic.  Eyes:     Pupils: Pupils are equal, round, and reactive to light.  Cardiovascular:     Rate and Rhythm: Normal rate and regular rhythm.  Pulmonary:     Effort: Pulmonary effort is normal. No respiratory distress.  Neurological:     Mental Status: She is alert and oriented to person, place, and time.     Cranial Nerves: No cranial nerve deficit.     Coordination: Coordination normal.     Gait: Gait normal.   Psychiatric:        Mood and Affect: Mood normal.        Behavior: Behavior normal.       Assessment/Plan: 1. Cellulitis of right forearm Significantly improved, almost resolved. Completed antibiotic course, no longer allowing the stray cat to come in her house.   2. B12 deficiency Administered in office today.  - cyanocobalamin ((VITAMIN B-12)) injection 1,000 mcg   General Counseling: Mahli verbalizes understanding of the findings of todays visit and agrees with plan of treatment. I have discussed any further diagnostic evaluation that may be needed or ordered today. We also  reviewed her medications today. she has been encouraged to call the office with any questions or concerns that should arise related to todays visit.    No orders of the defined types were placed in this encounter.   Meds ordered this encounter  Medications   cyanocobalamin ((VITAMIN B-12)) injection 1,000 mcg     Return in about 3 months (around 07/07/2021) for F/U, med refill, Aryiah Monterosso PCP, one more nurse visit for B12 next week then monthly B12 nurse visit .   Total time spent:20 Minutes Time spent includes review of chart, medications, test results, and follow up plan with the patient.   [place] Controlled Substance Database was reviewed by me.  This patient was seen by Jonetta Osgood, FNP-C in collaboration with Dr. Clayborn Bigness as a part of collaborative care agreement.   Donisha Hoch R. Valetta Fuller, MSN, FNP-C Internal medicine

## 2021-04-13 ENCOUNTER — Other Ambulatory Visit: Payer: Self-pay

## 2021-04-13 ENCOUNTER — Encounter: Payer: Self-pay | Admitting: Nurse Practitioner

## 2021-04-13 ENCOUNTER — Ambulatory Visit (INDEPENDENT_AMBULATORY_CARE_PROVIDER_SITE_OTHER): Payer: Managed Care, Other (non HMO)

## 2021-04-13 ENCOUNTER — Ambulatory Visit: Payer: Managed Care, Other (non HMO)

## 2021-04-13 DIAGNOSIS — E538 Deficiency of other specified B group vitamins: Secondary | ICD-10-CM | POA: Diagnosis not present

## 2021-04-13 MED ORDER — CYANOCOBALAMIN 1000 MCG/ML IJ SOLN: 1000.0000 ug | Freq: Once | INTRAMUSCULAR | Status: AC

## 2021-04-13 NOTE — Progress Notes (Cosign Needed)
B12 injection given

## 2021-04-16 ENCOUNTER — Other Ambulatory Visit: Payer: Self-pay | Admitting: Internal Medicine

## 2021-04-16 DIAGNOSIS — E782 Mixed hyperlipidemia: Secondary | ICD-10-CM

## 2021-05-05 ENCOUNTER — Encounter: Payer: Self-pay | Admitting: Nurse Practitioner

## 2021-05-17 ENCOUNTER — Other Ambulatory Visit: Payer: Self-pay | Admitting: Nurse Practitioner

## 2021-05-17 ENCOUNTER — Ambulatory Visit (INDEPENDENT_AMBULATORY_CARE_PROVIDER_SITE_OTHER): Payer: Managed Care, Other (non HMO)

## 2021-05-17 ENCOUNTER — Other Ambulatory Visit: Payer: Self-pay

## 2021-05-17 DIAGNOSIS — E538 Deficiency of other specified B group vitamins: Secondary | ICD-10-CM

## 2021-05-17 MED ORDER — FLUCONAZOLE 150 MG PO TABS: 150.0000 mg | ORAL_TABLET | Freq: Once | ORAL | 0 refills | Status: AC

## 2021-05-17 MED ORDER — CYANOCOBALAMIN 1000 MCG/ML IJ SOLN
1000.0000 ug | Freq: Once | INTRAMUSCULAR | Status: AC
  Administered 2021-05-17: 1000 ug via INTRAMUSCULAR

## 2021-05-17 MED ORDER — SULFAMETHOXAZOLE-TRIMETHOPRIM 800-160 MG PO TABS: 1.0000 | ORAL_TABLET | Freq: Two times a day (BID) | ORAL | 0 refills | Status: AC

## 2021-05-19 ENCOUNTER — Other Ambulatory Visit: Payer: Self-pay | Admitting: Physician Assistant

## 2021-05-19 DIAGNOSIS — R41840 Attention and concentration deficit: Secondary | ICD-10-CM

## 2021-06-21 ENCOUNTER — Ambulatory Visit: Payer: Managed Care, Other (non HMO)

## 2021-07-03 ENCOUNTER — Telehealth (INDEPENDENT_AMBULATORY_CARE_PROVIDER_SITE_OTHER): Payer: Self-pay

## 2021-07-03 NOTE — Telephone Encounter (Signed)
Pt called and left  a VM on the nurse line I returned the call but got no answer I left a VM to Call back.

## 2021-07-06 ENCOUNTER — Ambulatory Visit: Payer: Managed Care, Other (non HMO) | Admitting: Nurse Practitioner

## 2021-07-06 ENCOUNTER — Other Ambulatory Visit: Payer: Self-pay

## 2021-07-06 ENCOUNTER — Encounter: Payer: Self-pay | Admitting: Nurse Practitioner

## 2021-07-06 DIAGNOSIS — J300 Vasomotor rhinitis: Secondary | ICD-10-CM | POA: Diagnosis not present

## 2021-07-06 DIAGNOSIS — E782 Mixed hyperlipidemia: Secondary | ICD-10-CM

## 2021-07-06 DIAGNOSIS — M545 Low back pain, unspecified: Secondary | ICD-10-CM | POA: Diagnosis not present

## 2021-07-06 DIAGNOSIS — R41840 Attention and concentration deficit: Secondary | ICD-10-CM

## 2021-07-06 DIAGNOSIS — G8929 Other chronic pain: Secondary | ICD-10-CM

## 2021-07-06 DIAGNOSIS — K219 Gastro-esophageal reflux disease without esophagitis: Secondary | ICD-10-CM

## 2021-07-06 DIAGNOSIS — F411 Generalized anxiety disorder: Secondary | ICD-10-CM

## 2021-07-06 DIAGNOSIS — R110 Nausea: Secondary | ICD-10-CM

## 2021-07-06 MED ORDER — OMEPRAZOLE 40 MG PO CPDR: 40.0000 mg | DELAYED_RELEASE_CAPSULE | Freq: Every morning | ORAL | 3 refills | Status: DC

## 2021-07-06 MED ORDER — AMPHETAMINE-DEXTROAMPHETAMINE 5 MG PO TABS: 1.0000 | ORAL_TABLET | Freq: Every day | ORAL | 0 refills | Status: DC

## 2021-07-06 MED ORDER — FLUTICASONE PROPIONATE 50 MCG/ACT NA SUSP: 2.0000 | Freq: Every day | NASAL | 3 refills | Status: DC

## 2021-07-06 MED ORDER — MELOXICAM 15 MG PO TABS: 15.0000 mg | ORAL_TABLET | Freq: Every day | ORAL | 3 refills | Status: DC

## 2021-07-06 MED ORDER — IBUPROFEN 800 MG PO TABS: 800.0000 mg | ORAL_TABLET | Freq: Three times a day (TID) | ORAL | 0 refills | Status: DC

## 2021-07-06 MED ORDER — BUSPIRONE HCL 5 MG PO TABS: 5.0000 mg | ORAL_TABLET | Freq: Two times a day (BID) | ORAL | 1 refills | Status: DC | PRN

## 2021-07-06 MED ORDER — CLONAZEPAM 0.5 MG PO TABS: 0.5000 mg | ORAL_TABLET | Freq: Every evening | ORAL | 1 refills | Status: DC | PRN

## 2021-07-06 MED ORDER — ONDANSETRON HCL 4 MG PO TABS: 4.0000 mg | ORAL_TABLET | Freq: Three times a day (TID) | ORAL | 0 refills | Status: DC | PRN

## 2021-07-06 MED ORDER — FENOFIBRATE 54 MG PO TABS: 54.0000 mg | ORAL_TABLET | Freq: Every day | ORAL | 3 refills | Status: DC

## 2021-07-06 NOTE — Progress Notes (Signed)
[facility] [place], [place] [address]  Internal MEDICINE  Office Visit Note  Patient Name: [name]  [id number]  [id number]  Date of Service: 07/06/2021  Chief Complaint  Patient presents with   Follow-up   Medication Refill    HPI Jerris presents for a follow-up visit for medication refills.  She continues to stay away from stray and feral cats after she was bit by 1 and developed a severe cellulitis on her right forearm which has now been resolved.  Her blood pressure is normal Her acid reflux is stable with omeprazole.  She takes Adderall for attention deficit.  She takes clonazepam for anxiety.  Most of her medications are due for refills.   Current Medication: Outpatient Encounter Medications as of 07/06/2021  Medication Sig   atorvastatin (LIPITOR) 40 MG tablet TAKE 1 TABLET BY MOUTH  DAILY AT 6 PM.   escitalopram (LEXAPRO) 20 MG tablet TAKE 1 TABLET BY MOUTH  DAILY   [DISCONTINUED] amphetamine-dextroamphetamine (ADDERALL) 5 MG tablet Take 1 tablet (5 mg total) by mouth daily with breakfast.   [DISCONTINUED] amphetamine-dextroamphetamine (ADDERALL) 5 MG tablet TAKE 1 TABLET BY MOUTH DAILY   [DISCONTINUED] busPIRone (BUSPAR) 5 MG tablet Take 1 tablet po BID prn anxiety (Patient taking differently: Take 5 mg by mouth 2 (two) times daily as needed (anxiety).)   [DISCONTINUED] clonazePAM (KLONOPIN) 0.5 MG tablet Take 1 tablet (0.5 mg total) by mouth at bedtime as needed for anxiety.   [DISCONTINUED] fenofibrate 54 MG tablet Take 1 tablet (54 mg total) by mouth daily.   [DISCONTINUED] fluticasone (FLONASE) 50 MCG/ACT nasal spray Place 2 sprays into both nostrils daily.   [DISCONTINUED] ibuprofen (ADVIL) 800 MG tablet Take 1 tablet (800 mg total) by mouth 3 (three) times daily.   [DISCONTINUED] meloxicam (MOBIC) 15 MG tablet TAKE 1 TABLET BY MOUTH AT  BEDTIME   [DISCONTINUED] omeprazole (PRILOSEC) 40 MG capsule Take 1 capsule (40 mg total) by mouth every  morning.   [DISCONTINUED] ondansetron (ZOFRAN) 4 MG tablet Take 1 tablet (4 mg total) by mouth every 8 (eight) hours as needed for nausea or vomiting.   amphetamine-dextroamphetamine (ADDERALL) 5 MG tablet Take 1 tablet (5 mg total) by mouth daily.   [START ON 08/03/2021] amphetamine-dextroamphetamine (ADDERALL) 5 MG tablet Take 1 tablet (5 mg total) by mouth daily.   [START ON 08/31/2021] amphetamine-dextroamphetamine (ADDERALL) 5 MG tablet Take 1 tablet (5 mg total) by mouth daily.   busPIRone (BUSPAR) 5 MG tablet Take 1 tablet (5 mg total) by mouth 2 (two) times daily as needed (anxiety).   clonazePAM (KLONOPIN) 0.5 MG tablet Take 1 tablet (0.5 mg total) by mouth at bedtime as needed for anxiety.   fenofibrate 54 MG tablet Take 1 tablet (54 mg total) by mouth daily.   fluticasone (FLONASE) 50 MCG/ACT nasal spray Place 2 sprays into both nostrils daily.   ibuprofen (ADVIL) 800 MG tablet Take 1 tablet (800 mg total) by mouth 3 (three) times daily.   meloxicam (MOBIC) 15 MG tablet Take 1 tablet (15 mg total) by mouth at bedtime.   omeprazole (PRILOSEC) 40 MG capsule Take 1 capsule (40 mg total) by mouth every morning.   ondansetron (ZOFRAN) 4 MG tablet Take 1 tablet (4 mg total) by mouth every 8 (eight) hours as needed for nausea or vomiting.   Facility-Administered Encounter Medications as of 07/06/2021  Medication   cyanocobalamin ((VITAMIN B-12)) injection 1,000 mcg    Surgical History: Past Surgical History:  Procedure  Laterality Date   CARPAL TUNNEL RELEASE Right 11/22/2019   Procedure: CARPAL TUNNEL RELEASE;  Surgeon: Earnestine Leys, MD;  Location: [facility] ORS;  Service: Orthopedics;  Laterality: Right;   COLONOSCOPY     TOE SURGERY  2005,2011   ingrown toenails in doctors office   UPPER GI ENDOSCOPY      Medical History: Past Medical History:  Diagnosis Date   ADHD    Allergy    Anxiety    Colon polyp    Diverticulosis    GERD (gastroesophageal reflux disease)    Hyperlipidemia      Family History: Family History  Problem Relation Age of Onset   Lung cancer Mother    Heart disease Father    Pancreatic cancer Sister     Social History   Socioeconomic History   Marital status: Married    Spouse name: Not on file   Number of children: Not on file   Years of education: Not on file   Highest education level: Not on file  Occupational History   Not on file  Tobacco Use   Smoking status: Never   Smokeless tobacco: Never  Vaping Use   Vaping Use: Never used  Substance and Sexual Activity   Alcohol use: Yes    Comment: ocassionally    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  HENT:  Negative for congestion, rhinorrhea, sneezing and sore throat.   Eyes:  Negative for redness.  Respiratory:  Negative for cough, chest tightness and shortness of breath.   Cardiovascular:  Negative for chest pain and palpitations.  Gastrointestinal:  Negative for abdominal pain, constipation, diarrhea, nausea and vomiting.  Genitourinary:  Negative for dysuria and frequency.  Musculoskeletal:  Negative for arthralgias, back pain, joint swelling and neck pain.  Skin:  Negative for rash.  Neurological: Negative.  Negative for tremors and numbness.  Hematological:  Negative for adenopathy. Does not bruise/bleed easily.  Psychiatric/Behavioral:  Negative for behavioral problems (Depression), sleep disturbance and suicidal ideas. The patient is not nervous/anxious.    Vital Signs: BP  128/76    Pulse 75    Temp 97.9 F (36.6 C)    Resp 16    Ht 5\' 2"  (1.575 m)    Wt 174 lb 9.6 oz (79.2 kg)    SpO2 97%    BMI 31.93 kg/m    Physical Exam Vitals reviewed.  Constitutional:      General: She is not in acute distress.    Appearance: Normal appearance. She is not ill-appearing.  HENT:     Head: Normocephalic and atraumatic.  Eyes:     Pupils: Pupils are equal, round, and reactive to light.  Cardiovascular:     Rate and Rhythm: Normal rate and regular rhythm.  Pulmonary:     Effort: Pulmonary effort is normal. No respiratory distress.  Neurological:     Mental Status: She is alert and oriented  to person, place, and time.  Psychiatric:        Mood and Affect: Mood normal.        Behavior: Behavior normal.       Assessment/Plan: 1. Gastroesophageal reflux disease without esophagitis Symptoms of gastroesophageal reflux stable with omeprazole, refills ordered. - omeprazole (PRILOSEC) 40 MG capsule; Take 1 capsule (40 mg total) by mouth every morning.  Dispense: 90 capsule; Refill: 3  2. Vasomotor rhinitis Stable, refills ordered. - fluticasone (FLONASE) 50 MCG/ACT nasal spray; Place 2 sprays into both nostrils daily.  Dispense: 48 g; Refill: 3  3. Mixed hyperlipidemia Stable, refills ordered - fenofibrate 54 MG tablet; Take 1 tablet (54 mg total) by mouth daily.  Dispense: 90 tablet; Refill: 3  4. Chronic bilateral low back pain without sciatica Patient uses ibuprofen and meloxicam as needed for chronic back pain.  She does not use them both at the same time.  She knows that taking ibuprofen and meloxicam at the same time can cause increased risk of bleeding. - ibuprofen (ADVIL) 800 MG tablet; Take 1 tablet (800 mg total) by mouth 3 (three) times daily.  Dispense: 45 tablet; Refill: 0 - meloxicam (MOBIC) 15 MG tablet; Take 1 tablet (15 mg total) by mouth at bedtime.  Dispense: 90 tablet; Refill: 3  5. Nausea Takes zofran as needed, refills ordered.  -  ondansetron (ZOFRAN) 4 MG tablet; Take 1 tablet (4 mg total) by mouth every 8 (eight) hours as needed for nausea or vomiting.  Dispense: 30 tablet; Refill: 0  6. Attention and concentration deficit Effective at current dose. 3 months of refills ordered. Follow up in 3 months for additional refills.  - amphetamine-dextroamphetamine (ADDERALL) 5 MG tablet; Take 1 tablet (5 mg total) by mouth daily.  Dispense: 30 tablet; Refill: 0 - amphetamine-dextroamphetamine (ADDERALL) 5 MG tablet; Take 1 tablet (5 mg total) by mouth daily.  Dispense: 30 tablet; Refill: 0 - amphetamine-dextroamphetamine (ADDERALL) 5 MG tablet; Take 1 tablet (5 mg total) by mouth daily.  Dispense: 30 tablet; Refill: 0  7. GAD (generalized anxiety disorder) Stable, refills ordered - clonazePAM (KLONOPIN) 0.5 MG tablet; Take 1 tablet (0.5 mg total) by mouth at bedtime as needed for anxiety.  Dispense: 30 tablet; Refill: 1 - busPIRone (BUSPAR) 5 MG tablet; Take 1 tablet (5 mg total) by mouth 2 (two) times daily as needed (anxiety).  Dispense: 180 tablet; Refill: 1   General Counseling: Myria verbalizes understanding of the findings of todays visit and agrees with plan of treatment. I have discussed any further diagnostic evaluation that may be needed or ordered today. We also reviewed her medications today. she has been encouraged to call the office with any questions or concerns that should arise related to todays visit.    No orders of the defined types were placed in this encounter.   Meds ordered this encounter  Medications   ibuprofen (ADVIL) 800 MG tablet    Sig: Take 1 tablet (800 mg total) by mouth 3 (three) times daily.    Dispense:  45 tablet    Refill:  0   fenofibrate 54 MG tablet    Sig: Take 1 tablet (54 mg total) by mouth daily.    Dispense:  90 tablet    Refill:  3   clonazePAM (KLONOPIN) 0.5 MG tablet    Sig: Take 1 tablet (0.5 mg total) by mouth at bedtime as needed for anxiety.    Dispense:  30 tablet     Refill:  1  meloxicam (MOBIC) 15 MG tablet    Sig: Take 1 tablet (15 mg total) by mouth at bedtime.    Dispense:  90 tablet    Refill:  3    Requesting 1 year supply   amphetamine-dextroamphetamine (ADDERALL) 5 MG tablet    Sig: Take 1 tablet (5 mg total) by mouth daily.    Dispense:  30 tablet    Refill:  0    Fill for February   amphetamine-dextroamphetamine (ADDERALL) 5 MG tablet    Sig: Take 1 tablet (5 mg total) by mouth daily.    Dispense:  30 tablet    Refill:  0    Fill for march   amphetamine-dextroamphetamine (ADDERALL) 5 MG tablet    Sig: Take 1 tablet (5 mg total) by mouth daily.    Dispense:  30 tablet    Refill:  0    Fill for 3/31   omeprazole (PRILOSEC) 40 MG capsule    Sig: Take 1 capsule (40 mg total) by mouth every morning.    Dispense:  90 capsule    Refill:  3   ondansetron (ZOFRAN) 4 MG tablet    Sig: Take 1 tablet (4 mg total) by mouth every 8 (eight) hours as needed for nausea or vomiting.    Dispense:  30 tablet    Refill:  0   fluticasone (FLONASE) 50 MCG/ACT nasal spray    Sig: Place 2 sprays into both nostrils daily.    Dispense:  48 g    Refill:  3    This is for 90 day prescription.   busPIRone (BUSPAR) 5 MG tablet    Sig: Take 1 tablet (5 mg total) by mouth 2 (two) times daily as needed (anxiety).    Dispense:  180 tablet    Refill:  1    Return in about 3 months (around 10/03/2021) for F/U, ADHD med check, anxiety med refill, Holley Wirt PCP.   Total time spent:30 Minutes Time spent includes review of chart, medications, test results, and follow up plan with the patient.   [place] Controlled Substance Database was reviewed by me.  This patient was seen by Jonetta Osgood, FNP-C in collaboration with Dr. Clayborn Bigness as a part of collaborative care agreement.   Michela Herst R. Valetta Fuller, MSN, FNP-C Internal medicine

## 2021-07-19 ENCOUNTER — Ambulatory Visit: Payer: Managed Care, Other (non HMO)

## 2021-07-20 ENCOUNTER — Other Ambulatory Visit: Payer: Self-pay

## 2021-07-20 ENCOUNTER — Ambulatory Visit (INDEPENDENT_AMBULATORY_CARE_PROVIDER_SITE_OTHER): Payer: Managed Care, Other (non HMO)

## 2021-07-20 DIAGNOSIS — E538 Deficiency of other specified B group vitamins: Secondary | ICD-10-CM

## 2021-07-20 MED ORDER — CYANOCOBALAMIN 1000 MCG/ML IJ SOLN
1000.0000 ug | Freq: Once | INTRAMUSCULAR | Status: AC
  Administered 2021-07-20: 1000 ug via INTRAMUSCULAR

## 2021-07-28 ENCOUNTER — Encounter: Payer: Self-pay | Admitting: Nurse Practitioner

## 2021-08-16 ENCOUNTER — Ambulatory Visit: Payer: Managed Care, Other (non HMO)

## 2021-08-17 ENCOUNTER — Other Ambulatory Visit: Payer: Self-pay

## 2021-08-17 ENCOUNTER — Ambulatory Visit (INDEPENDENT_AMBULATORY_CARE_PROVIDER_SITE_OTHER): Payer: Managed Care, Other (non HMO)

## 2021-08-17 DIAGNOSIS — E538 Deficiency of other specified B group vitamins: Secondary | ICD-10-CM

## 2021-08-17 MED ORDER — CYANOCOBALAMIN 1000 MCG/ML IJ SOLN
1000.0000 ug | Freq: Once | INTRAMUSCULAR | Status: AC
  Administered 2021-08-17: 1000 ug via INTRAMUSCULAR

## 2021-09-20 ENCOUNTER — Ambulatory Visit: Payer: Managed Care, Other (non HMO)

## 2021-09-21 ENCOUNTER — Ambulatory Visit (INDEPENDENT_AMBULATORY_CARE_PROVIDER_SITE_OTHER): Payer: Managed Care, Other (non HMO)

## 2021-09-21 DIAGNOSIS — E538 Deficiency of other specified B group vitamins: Secondary | ICD-10-CM | POA: Diagnosis not present

## 2021-09-21 MED ORDER — CYANOCOBALAMIN 1000 MCG/ML IJ SOLN
1000.0000 ug | Freq: Once | INTRAMUSCULAR | Status: AC
  Administered 2021-09-21: 1000 ug via INTRAMUSCULAR

## 2021-09-21 NOTE — Progress Notes (Signed)
b12

## 2021-09-28 ENCOUNTER — Ambulatory Visit: Payer: Managed Care, Other (non HMO) | Admitting: Nurse Practitioner

## 2021-09-28 ENCOUNTER — Encounter: Payer: Self-pay | Admitting: Nurse Practitioner

## 2021-09-28 VITALS — BP 128/70 | HR 70 | Temp 98.4°F | Resp 16 | Ht 62.0 in | Wt 178.8 lb

## 2021-09-28 DIAGNOSIS — E782 Mixed hyperlipidemia: Secondary | ICD-10-CM | POA: Diagnosis not present

## 2021-09-28 DIAGNOSIS — R41840 Attention and concentration deficit: Secondary | ICD-10-CM

## 2021-09-28 DIAGNOSIS — K219 Gastro-esophageal reflux disease without esophagitis: Secondary | ICD-10-CM

## 2021-09-28 DIAGNOSIS — Z79899 Other long term (current) drug therapy: Secondary | ICD-10-CM

## 2021-09-28 DIAGNOSIS — F411 Generalized anxiety disorder: Secondary | ICD-10-CM

## 2021-09-28 LAB — POCT URINE DRUG SCREEN
Methylenedioxyamphetamine: NOT DETECTED
POC Amphetamine UR: NOT DETECTED
POC BENZODIAZEPINES UR: NOT DETECTED
POC Barbiturate UR: NOT DETECTED
POC Cocaine UR: NOT DETECTED
POC Ecstasy UR: NOT DETECTED
POC Marijuana UR: POSITIVE — AB
POC Methadone UR: NOT DETECTED
POC Methamphetamine UR: NOT DETECTED
POC Opiate Ur: NOT DETECTED
POC Oxycodone UR: NOT DETECTED
POC PHENCYCLIDINE UR: NOT DETECTED
POC TRICYCLICS UR: NOT DETECTED

## 2021-09-28 MED ORDER — AMPHETAMINE-DEXTROAMPHETAMINE 5 MG PO TABS: 1.0000 | ORAL_TABLET | Freq: Two times a day (BID) | ORAL | 0 refills | Status: DC | PRN

## 2021-09-28 MED ORDER — AMPHETAMINE-DEXTROAMPHETAMINE 5 MG PO TABS: 1.0000 | ORAL_TABLET | Freq: Every day | ORAL | 0 refills | Status: DC

## 2021-09-28 MED ORDER — CLONAZEPAM 0.5 MG PO TABS: 0.5000 mg | ORAL_TABLET | Freq: Every evening | ORAL | 2 refills | Status: DC | PRN

## 2021-09-28 NOTE — Progress Notes (Signed)
[facility] ?[address] ?[place], [place] [address] ? ?Internal MEDICINE  ?Office Visit Note ? ?Patient Name: [name] ? [id number]  ?[id number] ? ?Date of Service: 09/28/2021 ? ?Chief Complaint  ?Patient presents with  ? Follow-up  ? Gastroesophageal Reflux  ? Hyperlipidemia  ? Anxiety  ? Medication Refill  ? ? ?HPI ?[name] presents for a follow-up visit for gastroesophageal reflux, anxiety, ADHD, and medication refills.  She takes Adderall 5 mg daily as needed for ADHD symptoms.  She reports that there are some days she feels like she needs a second dose.  She is also in need of refills of clonazepam.  Blood pressure is well controlled.  Her heart rate is also within normal limits.  She denies any palpitations or other adverse side effects of the Adderall medication.  Her acid reflux is also well controlled with current medications. ? ? ? ?Current Medication: ?Outpatient Encounter Medications as of 09/28/2021  ?Medication Sig  ? atorvastatin (LIPITOR) 40 MG tablet TAKE 1 TABLET BY MOUTH  DAILY AT 6 PM.  ? busPIRone (BUSPAR) 5 MG tablet Take 1 tablet (5 mg total) by mouth 2 (two) times daily as needed (anxiety).  ? escitalopram (LEXAPRO) 20 MG tablet TAKE 1 TABLET BY MOUTH  DAILY  ? fenofibrate 54 MG tablet Take 1 tablet (54 mg total) by mouth daily.  ? fluticasone (FLONASE) 50 MCG/ACT nasal spray Place 2 sprays into both nostrils daily.  ? ibuprofen (ADVIL) 800 MG tablet Take 1 tablet (800 mg total) by mouth 3 (three) times daily.  ? meloxicam (MOBIC) 15 MG tablet Take 1 tablet (15 mg total) by mouth at bedtime.  ? omeprazole (PRILOSEC) 40 MG capsule Take 1 capsule (40 mg total) by mouth every morning.  ? ondansetron (ZOFRAN) 4 MG tablet Take 1 tablet (4 mg total) by mouth every 8 (eight) hours as needed for nausea or vomiting.  ? [DISCONTINUED] amphetamine-dextroamphetamine (ADDERALL) 5 MG tablet Take 1 tablet (5 mg total) by mouth daily.  ? [DISCONTINUED] amphetamine-dextroamphetamine (ADDERALL)  5 MG tablet Take 1 tablet (5 mg total) by mouth daily.  ? [DISCONTINUED] amphetamine-dextroamphetamine (ADDERALL) 5 MG tablet Take 1 tablet (5 mg total) by mouth daily.  ? [DISCONTINUED] clonazePAM (KLONOPIN) 0.5 MG tablet Take 1 tablet (0.5 mg total) by mouth at bedtime as needed for anxiety.  ? amphetamine-dextroamphetamine (ADDERALL) 5 MG tablet Take 1 tablet (5 mg total) by mouth daily.  ? [START ON 10/26/2021] amphetamine-dextroamphetamine (ADDERALL) 5 MG tablet Take 1 tablet (5 mg total) by mouth daily.  ? [START ON 11/23/2021] amphetamine-dextroamphetamine (ADDERALL) 5 MG tablet Take 1 tablet (5 mg total) by mouth daily.  ? clonazePAM (KLONOPIN) 0.5 MG tablet Take 1 tablet (0.5 mg total) by mouth at bedtime as needed for anxiety.  ? ?Facility-Administered Encounter Medications as of 09/28/2021  ?Medication  ? cyanocobalamin ((VITAMIN B-12)) injection 1,000 mcg  ? ? ?Surgical History: ?Past Surgical History:  ?Procedure Laterality Date  ? CARPAL TUNNEL RELEASE Right 11/22/2019  ? Procedure: CARPAL TUNNEL RELEASE;  Surgeon: Earnestine Leys, MD;  Location: [facility] ORS;  Service: Orthopedics;  Laterality: Right;  ? COLONOSCOPY    ? TOE SURGERY  2005,2011  ? ingrown toenails in doctors office  ? UPPER GI ENDOSCOPY    ? ? ?Medical History: ?Past Medical History:  ?Diagnosis Date  ? ADHD   ? Allergy   ? Anxiety   ? Colon polyp   ? Diverticulosis   ? GERD (gastroesophageal reflux disease)   ?  Hyperlipidemia   ? ? ?Family History: ?Family History  ?Problem Relation Age of Onset  ? Lung cancer Mother   ? Heart disease Father   ? Pancreatic cancer Sister   ? ? ?Social History  ? ?Socioeconomic History  ? Marital status: Married  ?  Spouse name: Not on file  ? Number of children: Not on file  ? Years of education: Not on file  ? Highest education level: Not on file  ?Occupational History  ? Not on file  ?Tobacco Use  ? Smoking status: Never  ? Smokeless tobacco: Never  ?Vaping Use  ? Vaping Use: Never used  ?Substance and Sexual  Activity  ? Alcohol use: Yes  ?  Comment: ocassionally   ? Drug use: Yes  ?  Types: Marijuana  ?  Comment: daily use  ? Sexual activity: Not on file  ?Other Topics Concern  ? Not on file  ?Social History Narrative  ? Not on file  ? ?Social Determinants of Health  ? ?Financial Resource Strain: Not on file  ?Food Insecurity: Not on file  ?Transportation Needs: Not on file  ?Physical Activity: Not on file  ?Stress: Not on file  ?Social Connections: Not on file  ?Intimate Partner Violence: Not on file  ? ? ? ? ?Review of Systems  ?Constitutional:  Negative for chills, fatigue and unexpected weight change.  ?HENT:  Negative for congestion, rhinorrhea, sneezing and sore throat.   ?Eyes:  Negative for redness.  ?Respiratory:  Negative for cough, chest tightness and shortness of breath.   ?Cardiovascular:  Negative for chest pain and palpitations.  ?Gastrointestinal:  Negative for abdominal pain, constipation, diarrhea, nausea and vomiting.  ?Genitourinary:  Negative for dysuria and frequency.  ?Musculoskeletal:  Negative for arthralgias, back pain, joint swelling and neck pain.  ?Skin:  Negative for rash.  ?Neurological: Negative.  Negative for tremors and numbness.  ?Hematological:  Negative for adenopathy. Does not bruise/bleed easily.  ?Psychiatric/Behavioral:  Negative for behavioral problems (Depression), sleep disturbance and suicidal ideas. The patient is not nervous/anxious.   ? ?Vital Signs: ?BP 128/70   Pulse 70   Temp 98.4 ?F (36.9 ?C)   Resp 16   Ht '5\' 2"'$  (1.575 m)   Wt 178 lb 12.8 oz (81.1 kg)   SpO2 97%   BMI 32.70 kg/m?  ? ? ?Physical Exam ?Vitals reviewed.  ?Constitutional:   ?   General: She is not in acute distress. ?   Appearance: Normal appearance. She is obese. She is not ill-appearing.  ?HENT:  ?   Head: Normocephalic and atraumatic.  ?Eyes:  ?   Pupils: Pupils are equal, round, and reactive to light.  ?Cardiovascular:  ?   Rate and Rhythm: Normal rate and regular rhythm.  ?Pulmonary:  ?    Effort: Pulmonary effort is normal. No respiratory distress.  ?Neurological:  ?   Mental Status: She is alert and oriented to person, place, and time.  ?Psychiatric:     ?   Mood and Affect: Mood normal.     ?   Behavior: Behavior normal.  ? ? ? ? ? ?Assessment/Plan: ?1. Gastroesophageal reflux disease without esophagitis ?Stable, continue omeprazole as prescribed. ? ?2. Mixed hyperlipidemia ?Stable continue atorvastatin as prescribed ? ?3. Attention and concentration deficit ?Prescription adjusted to allow for patient to receive the extra doses Adderall so she is able to take 1 extra dose on some days each month.  Refills ordered for 3 months, follow-up in 3 months for additional  refills. ?- amphetamine-dextroamphetamine (ADDERALL) 5 MG tablet; Take 1 tablet (5 mg total) by mouth 2 (two) times daily as needed (ADHD symptoms).  Dispense: 45 tablet; Refill: 0 ?- amphetamine-dextroamphetamine (ADDERALL) 5 MG tablet; Take 1 tablet (5 mg total) by mouth 2 (two) times daily as needed (ADHD symptoms).  Dispense: 45 tablet; Refill: 0 ?- amphetamine-dextroamphetamine (ADDERALL) 5 MG tablet; Take 1 tablet (5 mg total) by mouth 2 (two) times daily as needed (ADHD symptoms).  Dispense: 45 tablet; Refill: 0 ? ?4. GAD (generalized anxiety disorder) ?Stable, refills x3 months ordered, follow-up in 3 months for additional refills ?- clonazePAM (KLONOPIN) 0.5 MG tablet; Take 1 tablet (0.5 mg total) by mouth at bedtime as needed for anxiety.  Dispense: 30 tablet; Refill: 2 ? ?5. Encounter for long-term (current) use of medications ?Urine drug screen done in office, patient is positive for marijuana which is typical when compared to previous UDS.  This is the information that the patient has disclosed before so no significant change.  Patient was negative for amphetamine but patient does not always take her Adderall every day ?- POCT Urine Drug Screen ? ? ?General Counseling: Latesia verbalizes understanding of the findings of todays  visit and agrees with plan of treatment. I have discussed any further diagnostic evaluation that may be needed or ordered today. We also reviewed her medications today. she has been encouraged to call the office

## 2021-10-18 ENCOUNTER — Ambulatory Visit: Payer: Managed Care, Other (non HMO)

## 2021-10-19 ENCOUNTER — Ambulatory Visit: Payer: Managed Care, Other (non HMO)

## 2021-10-22 ENCOUNTER — Ambulatory Visit: Payer: Managed Care, Other (non HMO)

## 2021-11-12 ENCOUNTER — Ambulatory Visit (INDEPENDENT_AMBULATORY_CARE_PROVIDER_SITE_OTHER): Payer: Managed Care, Other (non HMO) | Admitting: Internal Medicine

## 2021-11-12 VITALS — BP 128/75 | HR 78 | Resp 16 | Ht 62.0 in | Wt 178.0 lb

## 2021-11-12 DIAGNOSIS — Z7189 Other specified counseling: Secondary | ICD-10-CM

## 2021-11-12 DIAGNOSIS — Z9989 Dependence on other enabling machines and devices: Secondary | ICD-10-CM

## 2021-11-12 DIAGNOSIS — G4733 Obstructive sleep apnea (adult) (pediatric): Secondary | ICD-10-CM

## 2021-11-12 NOTE — Progress Notes (Signed)
[facility] [place], [place] [address]  Pulmonary Sleep Medicine   Office Visit Note  Patient Name: [name] DOB: 1955/06/30 MRN [id number]    Chief Complaint: Obstructive Sleep Apnea visit  Brief History:  [name] is seen today for follow up. The patient has a 18 month history of sleep apnea. Patient is using PAP nightly with small wide N30i nasal mask..  The patient feels energized after sleeping with PAP.  The patient reports benefiting from PAP use. Epworth Sleepiness Score is 1 out of 24. The patient does not take naps. The patient complains of the following:  wants to try full face. The compliance download shows  compliance with an average use time of 6:36  hours @ 92%. The AHI is 2.1  The patient does not complain of limb movements disrupting sleep, only occasionally she may feel like her feet tingle at night.   ROS  General: (-) fever, (-) chills, (-) night sweat Nose and Sinuses: (-) nasal stuffiness or itchiness, (-) postnasal drip, (-) nosebleeds, (-) sinus trouble. Mouth and Throat: (-) sore throat, (-) hoarseness. Neck: (-) swollen glands, (-) enlarged thyroid, (-) neck pain. Respiratory: - cough, - shortness of breath, - wheezing. Neurologic: - numbness, - tingling. Psychiatric: +anxiety, - depression   Current Medication: Outpatient Encounter Medications as of 11/12/2021  Medication Sig   amphetamine-dextroamphetamine (ADDERALL) 5 MG tablet Take 1 tablet (5 mg total) by mouth 2 (two) times daily as needed (ADHD symptoms).   amphetamine-dextroamphetamine (ADDERALL) 5 MG tablet Take 1 tablet (5 mg total) by mouth 2 (two) times daily as needed (ADHD symptoms).   [START ON 11/23/2021] amphetamine-dextroamphetamine (ADDERALL) 5 MG tablet Take 1 tablet (5 mg total) by mouth 2 (two) times daily as needed (ADHD symptoms).   atorvastatin (LIPITOR) 40 MG tablet TAKE 1 TABLET BY MOUTH  DAILY AT 6 PM.   busPIRone (BUSPAR) 5 MG tablet Take 1 tablet  (5 mg total) by mouth 2 (two) times daily as needed (anxiety).   clonazePAM (KLONOPIN) 0.5 MG tablet Take 1 tablet (0.5 mg total) by mouth at bedtime as needed for anxiety.   escitalopram (LEXAPRO) 20 MG tablet TAKE 1 TABLET BY MOUTH  DAILY   fenofibrate 54 MG tablet Take 1 tablet (54 mg total) by mouth daily.   fluticasone (FLONASE) 50 MCG/ACT nasal spray Place 2 sprays into both nostrils daily.   ibuprofen (ADVIL) 800 MG tablet Take 1 tablet (800 mg total) by mouth 3 (three) times daily.   meloxicam (MOBIC) 15 MG tablet Take 1 tablet (15 mg total) by mouth at bedtime.   omeprazole (PRILOSEC) 40 MG capsule Take 1 capsule (40 mg total) by mouth every morning.   ondansetron (ZOFRAN) 4 MG tablet Take 1 tablet (4 mg total) by mouth every 8 (eight) hours as needed for nausea or vomiting.   Facility-Administered Encounter Medications as of 11/12/2021  Medication   cyanocobalamin ((VITAMIN B-12)) injection 1,000 mcg    Surgical History: Past Surgical History:  Procedure Laterality Date   CARPAL TUNNEL RELEASE Right 11/22/2019   Procedure: CARPAL TUNNEL RELEASE;  Surgeon: Earnestine Leys, MD;  Location: [facility] ORS;  Service: Orthopedics;  Laterality: Right;   COLONOSCOPY     TOE SURGERY  2005,2011   ingrown toenails in doctors office   UPPER GI ENDOSCOPY      Medical History: Past Medical History:  Diagnosis Date   ADHD    Allergy    Anxiety    Colon polyp  Diverticulosis    GERD (gastroesophageal reflux disease)    Hyperlipidemia     Family History: Non contributory to the present illness  Social History: Social History   Socioeconomic History   Marital status: Married    Spouse name: Not on file   Number of children: Not on file   Years of education: Not on file   Highest education level: Not on file  Occupational History   Not on file  Tobacco Use   Smoking status: Never   Smokeless tobacco: Never  Vaping Use   Vaping Use: Never used  Substance and Sexual Activity    Alcohol use: Yes    Comment: ocassionally    Drug use: Yes    Types: Marijuana    Comment: daily use   Sexual activity: Not on file  Other Topics Concern   Not on file  Social History Narrative   Not on file   Social Determinants of Health   Financial Resource Strain: Not on file  Food Insecurity: Not on file  Transportation Needs: Not on file  Physical Activity: Not on file  Stress: Not on file  Social Connections: Not on file  Intimate Partner Violence: Not on file    Vital Signs: Blood pressure 128/75, pulse 78, resp. rate 16, height '5\' 2"'$  (1.575 m), weight 178 lb (80.7 kg), SpO2 96 %. Body mass index is 32.56 kg/m.    Examination: General Appearance: The patient is well-developed, well-nourished, and in no distress. Neck Circumference: 42 cm Skin: Gross inspection of skin unremarkable. Head: normocephalic, no gross deformities. Eyes: no gross deformities noted. ENT: ears appear grossly normal Neurologic: Alert and oriented. No involuntary movements.    EPWORTH SLEEPINESS SCALE:  Scale:  (0)= no chance of dozing; (1)= slight chance of dozing; (2)= moderate chance of dozing; (3)= high chance of dozing  Chance  Situtation    Sitting and reading: 0    Watching TV: 0    Sitting Inactive in public: 0    As a passenger in car: 0      Lying down to rest: 1    Sitting and talking: 0    Sitting quielty after lunch: 0    In a car, stopped in traffic: 0   TOTAL SCORE:   1 out of 24    SLEEP STUDIES:  PSG 06/27/20  -  AHI 20.9 (REM AHI 67.6)(supine AHI 29.8), min SpO2  79%   CPAP COMPLIANCE DATA:  Date Range: 11/04/20 - 11/03/21  Average Daily Use: 6:36 hours  Median Use: 6:45 hours  Compliance for > 4 Hours: 92%  AHI: 2.1 respiratory events per hour  Days Used: 358/365  Mask Leak: 27.4 lpm  95th Percentile Pressure: 12.4 cmH2O   LABS: Recent Results (from the past 2160 hour(s))  POCT Urine Drug Screen     Status: Abnormal   Collection  Time: 09/28/21  1:30 PM  Result Value Ref Range   POC Methamphetamine UR None Detected None Detected   POC Opiate Ur None Detected None Detected   POC Barbiturate UR None Detected None Detected   POC Amphetamine UR None Detected None Detected   POC Oxycodone UR None Detected None Detected   POC Cocaine UR None Detected None Detected   POC Ecstasy UR None Detected None Detected   POC TRICYCLICS UR None Detected None Detected   POC PHENCYCLIDINE UR None Detected None Detected   POC Marijuana UR Positive (A) None Detected   POC Methadone UR None Detected  None Detected   POC BENZODIAZEPINES UR None Detected None Detected   URINE TEMPERATURE     POC DRUG SCREEN OXIDANTS URINE     POC SPECIFIC GRAVITY URINE     POC PH URINE     Methylenedioxyamphetamine None Detected None Detected    Radiology: MM 3D SCREEN BREAST BILATERAL  Result Date: 03/07/2021 CLINICAL DATA:  Screening. EXAM: DIGITAL SCREENING BILATERAL MAMMOGRAM WITH TOMOSYNTHESIS AND CAD TECHNIQUE: Bilateral screening digital craniocaudal and mediolateral oblique mammograms were obtained. Bilateral screening digital breast tomosynthesis was performed. The images were evaluated with computer-aided detection. COMPARISON:  Previous exam(s). ACR Breast Density Category b: There are scattered areas of fibroglandular density. FINDINGS: There are no findings suspicious for malignancy. IMPRESSION: No mammographic evidence of malignancy. A result letter of this screening mammogram will be mailed directly to the patient. RECOMMENDATION: Screening mammogram in one year. (Code:SM-B-01Y) BI-RADS CATEGORY  1: Negative. Electronically Signed   By: Audie Pinto M.D.   On: 03/07/2021 16:12   No results found.  No results found.    Assessment and Plan: Patient Active Problem List   Diagnosis Date Noted   OSA on CPAP 11/20/2020   CPAP use counseling 11/20/2020   Overweight (BMI 25.0-29.9) 11/20/2020   Excessive daytime sleepiness 06/21/2020    Loud snoring 06/21/2020   BMI 32.0-32.9,adult 03/24/2020   Needs flu shot 03/24/2020   Vasomotor rhinitis 08/22/2019   Encounter for general adult medical examination with abnormal findings 05/16/2019   Encounter for long-term (current) use of medications 04/17/2019   Screening for breast cancer 02/09/2018   Routine cervical smear 02/09/2018   Gastroesophageal reflux disease without esophagitis 11/12/2017   Urinary tract infection with hematuria 07/30/2017   Acute cystitis with hematuria 07/30/2017   Low back pain at multiple sites 07/30/2017   Dysuria 07/30/2017   Mixed hyperlipidemia 07/30/2017   Attention and concentration deficit 07/30/2017   GAD (generalized anxiety disorder) 07/30/2017   Other fatigue 07/30/2017   1. OSA on CPAP The patient does  tolerate PAP and reports  benefit from PAP use. She would like to try a full face mask and a mask fitting will be scheduled. The patient was reminded how to clean equipment and advised to replace supplies routinely. The patient was also counselled on weight loss. The compliance is very good. The AHI is 2.1.   OSA on cpap- cpap continues to be medically necessary to treat the patient's OSA. Continue with very good compliance, f/u one year.    2. CPAP use counseling CPAP Counseling: had a lengthy discussion with the patient regarding the importance of PAP therapy in management of the sleep apnea. Patient appears to understand the risk factor reduction and also understands the risks associated with untreated sleep apnea. Patient will try to make a good faith effort to remain compliant with therapy. Also instructed the patient on proper cleaning of the device including the water must be changed daily if possible and use of distilled water is preferred. Patient understands that the machine should be regularly cleaned with appropriate recommended cleaning solutions that do not damage the PAP machine for example given white vinegar and water  rinses. Other methods such as ozone treatment may not be as good as these simple methods to achieve cleaning.     General Counseling: I have discussed the findings of the evaluation and examination with [name].  I have also discussed any further diagnostic evaluation thatmay be needed or ordered today. [name] verbalizes understanding of the findings of todays visit.  We also reviewed her medications today and discussed drug interactions and side effects including but not limited excessive drowsiness and altered mental states. We also discussed that there is always a risk not just to her but also people around her. she has been encouraged to call the office with any questions or concerns that should arise related to todays visit.  No orders of the defined types were placed in this encounter.       I have personally obtained a history, examined the patient, evaluated laboratory and imaging results, formulated the assessment and plan and placed orders. This patient was seen today by Tressie Ellis, PA-C in collaboration with Dr. Devona Konig.   Allyne Gee, MD [facility] Diplomate ABMS Pulmonary Critical Care Medicine and Sleep Medicine

## 2021-11-12 NOTE — Patient Instructions (Signed)

## 2021-11-16 ENCOUNTER — Ambulatory Visit: Payer: Managed Care, Other (non HMO)

## 2021-11-26 ENCOUNTER — Ambulatory Visit: Payer: Managed Care, Other (non HMO)

## 2021-11-30 ENCOUNTER — Ambulatory Visit (INDEPENDENT_AMBULATORY_CARE_PROVIDER_SITE_OTHER): Payer: Managed Care, Other (non HMO)

## 2021-11-30 DIAGNOSIS — E538 Deficiency of other specified B group vitamins: Secondary | ICD-10-CM | POA: Diagnosis not present

## 2021-11-30 MED ORDER — CYANOCOBALAMIN 1000 MCG/ML IJ SOLN
1000.0000 ug | Freq: Once | INTRAMUSCULAR | Status: AC
Start: 2021-11-30 — End: 2021-11-30
  Administered 2021-11-30: 1000 ug via INTRAMUSCULAR

## 2021-11-30 MED ORDER — CLOTRIMAZOLE-BETAMETHASONE 1-0.05 % EX CREA: 1.0000 | TOPICAL_CREAM | Freq: Two times a day (BID) | CUTANEOUS | 1 refills | Status: DC

## 2021-11-30 NOTE — Progress Notes (Signed)
Pt came in for B12 injection today and she mentioned she has a rash under her left breast.  Pt has tried some OTC medications and they helped a little but still not getting better.  I had Alyssa look at the area and she sent in [place].  Pt will apply cream twice a day until resolved.

## 2021-12-03 ENCOUNTER — Encounter: Payer: Self-pay | Admitting: Nurse Practitioner

## 2021-12-03 ENCOUNTER — Telehealth: Payer: Managed Care, Other (non HMO) | Admitting: Nurse Practitioner

## 2021-12-03 VITALS — Resp 16 | Ht 62.0 in | Wt 170.0 lb

## 2021-12-03 DIAGNOSIS — R110 Nausea: Secondary | ICD-10-CM

## 2021-12-03 DIAGNOSIS — A084 Viral intestinal infection, unspecified: Secondary | ICD-10-CM | POA: Diagnosis not present

## 2021-12-03 MED ORDER — ONDANSETRON HCL 4 MG PO TABS: 4.0000 mg | ORAL_TABLET | Freq: Three times a day (TID) | ORAL | 0 refills | Status: DC | PRN

## 2021-12-03 MED ORDER — DIPHENOXYLATE-ATROPINE 2.5-0.025 MG PO TABS: 1.0000 | ORAL_TABLET | Freq: Four times a day (QID) | ORAL | 0 refills | Status: DC | PRN

## 2021-12-03 NOTE — Progress Notes (Signed)
[facility] [place], [place] [address]  Internal MEDICINE  Telephone Visit  Patient Name: [name]  [id number]  [id number]  Date of Service: 12/03/2021  I connected with the patient at 11:20 AM by telephone and verified the patients identity using two identifiers.   I discussed the limitations, risks, security and privacy concerns of performing an evaluation and management service by telephone and the availability of in person appointments. I also discussed with the patient that there may be a patient responsible charge related to the service.  The patient expressed understanding and agrees to proceed.    Chief Complaint  Patient presents with   Acute Visit    stomach cramping loose stools, loss of appetite, neg covid test at home today, sometimes feels like heart is racing, comes and goes    Telephone Assessment    [phone]   Telephone Screen    Video    Fatigue    Since Friday    Generalized Body Aches    HPI [name] presents for a telehealth virtual visit for generalized feeling ill. She reports abdominal cramping, loose stools, lack of appetite, racing heartbeat, and fatigue and body aches since last Friday (3 days ago).  Negative home covid test.   Current Medication: Outpatient Encounter Medications as of 12/03/2021  Medication Sig   amphetamine-dextroamphetamine (ADDERALL) 5 MG tablet Take 1 tablet (5 mg total) by mouth 2 (two) times daily as needed (ADHD symptoms).   amphetamine-dextroamphetamine (ADDERALL) 5 MG tablet Take 1 tablet (5 mg total) by mouth 2 (two) times daily as needed (ADHD symptoms).   amphetamine-dextroamphetamine (ADDERALL) 5 MG tablet Take 1 tablet (5 mg total) by mouth 2 (two) times daily as needed (ADHD symptoms).   atorvastatin (LIPITOR) 40 MG tablet TAKE 1 TABLET BY MOUTH  DAILY AT 6 PM.   busPIRone (BUSPAR) 5 MG tablet Take 1 tablet (5 mg total) by mouth 2 (two) times daily as needed (anxiety).   clonazePAM  (KLONOPIN) 0.5 MG tablet Take 1 tablet (0.5 mg total) by mouth at bedtime as needed for anxiety.   clotrimazole-betamethasone (LOTRISONE) cream Apply 1 Application topically 2 (two) times daily. Until resolved   diphenoxylate-atropine (LOMOTIL) 2.5-0.025 MG tablet Take 1 tablet by mouth 4 (four) times daily as needed for diarrhea or loose stools.   escitalopram (LEXAPRO) 20 MG tablet TAKE 1 TABLET BY MOUTH  DAILY   fenofibrate 54 MG tablet Take 1 tablet (54 mg total) by mouth daily.   fluticasone (FLONASE) 50 MCG/ACT nasal spray Place 2 sprays into both nostrils daily.   ibuprofen (ADVIL) 800 MG tablet Take 1 tablet (800 mg total) by mouth 3 (three) times daily.   meloxicam (MOBIC) 15 MG tablet Take 1 tablet (15 mg total) by mouth at bedtime.   omeprazole (PRILOSEC) 40 MG capsule Take 1 capsule (40 mg total) by mouth every morning.   ondansetron (ZOFRAN) 4 MG tablet Take 1 tablet (4 mg total) by mouth every 8 (eight) hours as needed for nausea or vomiting.   [DISCONTINUED] ondansetron (ZOFRAN) 4 MG tablet Take 1 tablet (4 mg total) by mouth every 8 (eight) hours as needed for nausea or vomiting.   Facility-Administered Encounter Medications as of 12/03/2021  Medication   cyanocobalamin ((VITAMIN B-12)) injection 1,000 mcg    Surgical History: Past Surgical History:  Procedure Laterality Date   CARPAL TUNNEL RELEASE Right 11/22/2019   Procedure: CARPAL TUNNEL RELEASE;  Surgeon: Earnestine Leys, MD;  Location: [facility] ORS;  Service: Orthopedics;  Laterality: Right;   COLONOSCOPY     TOE SURGERY  2005,2011   ingrown toenails in doctors office   UPPER GI ENDOSCOPY      Medical History: Past Medical History:  Diagnosis Date   ADHD    Allergy    Anxiety    Colon polyp    Diverticulosis    GERD (gastroesophageal reflux disease)    Hyperlipidemia     Family History: Family History  Problem Relation Age of Onset   Lung cancer Mother    Heart disease Father    Pancreatic cancer Sister      Social History   Socioeconomic History   Marital status: Married    Spouse name: Not on file   Number of children: Not on file   Years of education: Not on file   Highest education level: Not on file  Occupational History   Not on file  Tobacco Use   Smoking status: Never   Smokeless tobacco: Never  Vaping Use   Vaping Use: Never used  Substance and Sexual Activity   Alcohol use: Yes    Comment: ocassionally    Drug use: Yes    Types: Marijuana    Comment: daily use   Sexual activity: Not on file  Other Topics Concern   Not on file  Social History Narrative   Not on file   Social Determinants of Health   Financial Resource Strain: Not on file  Food Insecurity: Not on file  Transportation Needs: Not on file  Physical Activity: Not on file  Stress: Not on file  Social Connections: Not on file  Intimate Partner Violence: Not on file      Review of Systems  Constitutional:  Positive for appetite change and fatigue. Negative for chills and fever.  HENT:  Negative for congestion, postnasal drip, rhinorrhea, sinus pressure, sinus pain, sneezing, sore throat and trouble swallowing.   Respiratory:  Negative for cough, chest tightness, shortness of breath and wheezing.   Cardiovascular: Negative.  Negative for chest pain and palpitations (heart racing at times).  Gastrointestinal:  Positive for abdominal distention, abdominal pain, diarrhea and nausea. Negative for blood in stool, constipation and vomiting.  Musculoskeletal:  Positive for myalgias.  Neurological:  Positive for dizziness, weakness and headaches. Negative for light-headedness.    Vital Signs: Resp 16   Ht '5\' 2"'$  (1.575 m)   Wt 170 lb (77.1 kg)   BMI 31.09 kg/m    Observation/Objective: She is alert and oriented and engages in conversation appropriately. She does not appear to be in any acute distress over video call.    Assessment/Plan: 1. Viral gastroenteritis Medications prescribed to aid  in managing symptoms. Patient encouraged to rest, stay hydrated and eat if she is able. Patient encouraged to call the clinic if there is no improvement in her symptoms by Friday this week or her symptoms are worse or if she develops additional symptoms. - ondansetron (ZOFRAN) 4 MG tablet; Take 1 tablet (4 mg total) by mouth every 8 (eight) hours as needed for nausea or vomiting.  Dispense: 30 tablet; Refill: 0 - diphenoxylate-atropine (LOMOTIL) 2.5-0.025 MG tablet; Take 1 tablet by mouth 4 (four) times daily as needed for diarrhea or loose stools.  Dispense: 30 tablet; Refill: 0  2. Nausea without vomiting Zofran prescribed.  - ondansetron (ZOFRAN) 4 MG tablet; Take 1 tablet (4 mg total) by mouth every 8 (eight) hours as needed for nausea or vomiting.  Dispense: 30 tablet; Refill: 0  General Counseling: Haydin verbalizes understanding of the findings of today's phone visit and agrees with plan of treatment. I have discussed any further diagnostic evaluation that may be needed or ordered today. We also reviewed her medications today. she has been encouraged to call the office with any questions or concerns that should arise related to todays visit.  Return if symptoms worsen or fail to improve.   No orders of the defined types were placed in this encounter.   Meds ordered this encounter  Medications   ondansetron (ZOFRAN) 4 MG tablet    Sig: Take 1 tablet (4 mg total) by mouth every 8 (eight) hours as needed for nausea or vomiting.    Dispense:  30 tablet    Refill:  0   diphenoxylate-atropine (LOMOTIL) 2.5-0.025 MG tablet    Sig: Take 1 tablet by mouth 4 (four) times daily as needed for diarrhea or loose stools.    Dispense:  30 tablet    Refill:  0    Time spent:10 Minutes Time spent with patient included reviewing progress notes, labs, imaging studies, and discussing plan for follow up.  [place] Controlled Substance Database was reviewed by me for overdose risk score (ORS) if  appropriate.  This patient was seen by Jonetta Osgood, FNP-C in collaboration with Dr. Clayborn Bigness as a part of collaborative care agreement.  Pedrohenrique [place] R. Valetta Fuller, MSN, FNP-C Internal medicine

## 2021-12-24 ENCOUNTER — Ambulatory Visit: Payer: Managed Care, Other (non HMO)

## 2021-12-24 ENCOUNTER — Ambulatory Visit: Payer: Managed Care, Other (non HMO) | Admitting: Nurse Practitioner

## 2022-01-11 ENCOUNTER — Telehealth: Payer: Self-pay

## 2022-01-12 NOTE — Telephone Encounter (Signed)
Pt advised to make appt for refills

## 2022-01-14 ENCOUNTER — Encounter: Payer: Self-pay | Admitting: Nurse Practitioner

## 2022-01-14 ENCOUNTER — Ambulatory Visit (INDEPENDENT_AMBULATORY_CARE_PROVIDER_SITE_OTHER): Payer: Managed Care, Other (non HMO) | Admitting: Nurse Practitioner

## 2022-01-14 VITALS — BP 132/81 | HR 63 | Temp 98.2°F | Resp 16 | Ht 62.0 in | Wt 177.0 lb

## 2022-01-14 DIAGNOSIS — E782 Mixed hyperlipidemia: Secondary | ICD-10-CM

## 2022-01-14 DIAGNOSIS — R110 Nausea: Secondary | ICD-10-CM | POA: Diagnosis not present

## 2022-01-14 DIAGNOSIS — A084 Viral intestinal infection, unspecified: Secondary | ICD-10-CM

## 2022-01-14 DIAGNOSIS — R41840 Attention and concentration deficit: Secondary | ICD-10-CM

## 2022-01-14 MED ORDER — ONDANSETRON HCL 4 MG PO TABS: 4.0000 mg | ORAL_TABLET | Freq: Three times a day (TID) | ORAL | 3 refills | Status: DC | PRN

## 2022-01-14 MED ORDER — AMPHETAMINE-DEXTROAMPHETAMINE 5 MG PO TABS: 1.0000 | ORAL_TABLET | Freq: Two times a day (BID) | ORAL | 0 refills | Status: DC | PRN

## 2022-01-14 NOTE — Progress Notes (Signed)
[facility] [place], [place] [address]  Internal MEDICINE  Office Visit Note  Patient Name: [name]  [id number]  [id number]  Date of Service: 01/14/2022  Chief Complaint  Patient presents with   Follow-up   Hyperlipidemia   Anxiety    HPI [name] presents for follow up visit for ADHD, high cholesterol, GERD and refills.  GERD -- sometimes gets nausea from this ADHD -- stable on current dose, heart rate and BP are stable. Denies any palpitations or other adverse side effects. Needs refills High cholesterol -- takes atorvastatin 40 mg daily.    Current Medication: Outpatient Encounter Medications as of 01/14/2022  Medication Sig   atorvastatin (LIPITOR) 40 MG tablet TAKE 1 TABLET BY MOUTH  DAILY AT 6 PM.   busPIRone (BUSPAR) 5 MG tablet Take 1 tablet (5 mg total) by mouth 2 (two) times daily as needed (anxiety).   clonazePAM (KLONOPIN) 0.5 MG tablet Take 1 tablet (0.5 mg total) by mouth at bedtime as needed for anxiety.   clotrimazole-betamethasone (LOTRISONE) cream Apply 1 Application topically 2 (two) times daily. Until resolved   diphenoxylate-atropine (LOMOTIL) 2.5-0.025 MG tablet Take 1 tablet by mouth 4 (four) times daily as needed for diarrhea or loose stools.   escitalopram (LEXAPRO) 20 MG tablet TAKE 1 TABLET BY MOUTH  DAILY   fenofibrate 54 MG tablet Take 1 tablet (54 mg total) by mouth daily.   fluticasone (FLONASE) 50 MCG/ACT nasal spray Place 2 sprays into both nostrils daily.   ibuprofen (ADVIL) 800 MG tablet Take 1 tablet (800 mg total) by mouth 3 (three) times daily.   meloxicam (MOBIC) 15 MG tablet Take 1 tablet (15 mg total) by mouth at bedtime.   omeprazole (PRILOSEC) 40 MG capsule Take 1 capsule (40 mg total) by mouth every morning.   [DISCONTINUED] amphetamine-dextroamphetamine (ADDERALL) 5 MG tablet Take 1 tablet (5 mg total) by mouth 2 (two) times daily as needed (ADHD symptoms).   [DISCONTINUED] amphetamine-dextroamphetamine  (ADDERALL) 5 MG tablet Take 1 tablet (5 mg total) by mouth 2 (two) times daily as needed (ADHD symptoms).   [DISCONTINUED] amphetamine-dextroamphetamine (ADDERALL) 5 MG tablet Take 1 tablet (5 mg total) by mouth 2 (two) times daily as needed (ADHD symptoms).   [DISCONTINUED] ondansetron (ZOFRAN) 4 MG tablet Take 1 tablet (4 mg total) by mouth every 8 (eight) hours as needed for nausea or vomiting.   amphetamine-dextroamphetamine (ADDERALL) 5 MG tablet Take 1 tablet (5 mg total) by mouth 2 (two) times daily as needed (ADHD symptoms).   [START ON 02/11/2022] amphetamine-dextroamphetamine (ADDERALL) 5 MG tablet Take 1 tablet (5 mg total) by mouth 2 (two) times daily as needed (ADHD symptoms).   [START ON 03/11/2022] amphetamine-dextroamphetamine (ADDERALL) 5 MG tablet Take 1 tablet (5 mg total) by mouth 2 (two) times daily as needed (ADHD symptoms).   ondansetron (ZOFRAN) 4 MG tablet Take 1 tablet (4 mg total) by mouth every 8 (eight) hours as needed for nausea or vomiting.   Facility-Administered Encounter Medications as of 01/14/2022  Medication   cyanocobalamin ((VITAMIN B-12)) injection 1,000 mcg    Surgical History: Past Surgical History:  Procedure Laterality Date   CARPAL TUNNEL RELEASE Right 11/22/2019   Procedure: CARPAL TUNNEL RELEASE;  Surgeon: Earnestine Leys, MD;  Location: [facility] ORS;  Service: Orthopedics;  Laterality: Right;   COLONOSCOPY     TOE SURGERY  2005,2011   ingrown toenails in doctors office   UPPER GI ENDOSCOPY      Medical History: Past  Medical History:  Diagnosis Date   ADHD    Allergy    Anxiety    Colon polyp    Diverticulosis    GERD (gastroesophageal reflux disease)    Hyperlipidemia     Family History: Family History  Problem Relation Age of Onset   Lung cancer Mother    Heart disease Father    Pancreatic cancer Sister     Social History   Socioeconomic History   Marital status: Married    Spouse name: Not on file   Number of children: Not on  file   Years of education: Not on file   Highest education level: Not on file  Occupational History   Not on file  Tobacco Use   Smoking status: Never   Smokeless tobacco: Never  Vaping Use   Vaping Use: Never used  Substance and Sexual Activity   Alcohol use: Yes    Comment: ocassionally    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  HENT:  Negative for congestion, rhinorrhea, sneezing and sore throat.   Eyes:  Negative for redness.  Respiratory: Negative.  Negative for cough, chest tightness, shortness of breath and wheezing.   Cardiovascular: Negative.  Negative for chest pain and palpitations.  Gastrointestinal:  Positive for nausea. Negative for abdominal pain, constipation, diarrhea and vomiting.  Genitourinary:  Negative for dysuria and frequency.  Musculoskeletal:  Negative for arthralgias, back pain, joint swelling and neck pain.  Skin:  Negative for rash.  Neurological: Negative.  Negative for tremors and numbness.  Hematological:  Negative for adenopathy. Does not bruise/bleed easily.  Psychiatric/Behavioral:  Negative for behavioral problems (Depression), sleep disturbance and suicidal ideas. The patient is not nervous/anxious.     Vital Signs: BP 132/81   Pulse 63   Temp 98.2 F (36.8 C)   Resp 16   Ht '5\' 2"'$  (1.575 m)   Wt 177 lb (80.3 kg)   SpO2 97%   BMI 32.37 kg/m    Physical Exam Vitals reviewed.  Constitutional:      General: She is not in acute distress.     Appearance: Normal appearance. She is not ill-appearing.  HENT:     Head: Normocephalic and atraumatic.  Eyes:     Pupils: Pupils are equal, round, and reactive to light.  Cardiovascular:     Rate and Rhythm: Normal rate and regular rhythm.  Pulmonary:     Effort: Pulmonary effort is normal. No respiratory distress.  Neurological:     Mental Status: She is alert and oriented to person, place, and time.  Psychiatric:        Mood and Affect: Mood normal.        Behavior: Behavior normal.        Assessment/Plan: 1. Mixed hyperlipidemia Continue atorvastatin as prescribed.   2. Viral gastroenteritis Chronic gastroenteritis, zofran prescribed to help with nausea. Other symptoms are manageable. - ondansetron (ZOFRAN)  4 MG tablet; Take 1 tablet (4 mg total) by mouth every 8 (eight) hours as needed for nausea or vomiting.  Dispense: 30 tablet; Refill: 3  3. Nausea without vomiting Zofran prescribed for nausea - ondansetron (ZOFRAN) 4 MG tablet; Take 1 tablet (4 mg total) by mouth every 8 (eight) hours as needed for nausea or vomiting.  Dispense: 30 tablet; Refill: 3  4. Attention and concentration deficit Refills x3 months ordered, follow up in 3 months for additional refills, also need UDS at next visit - amphetamine-dextroamphetamine (ADDERALL) 5 MG tablet; Take 1 tablet (5 mg total) by mouth 2 (two) times daily as needed (ADHD symptoms).  Dispense: 60 tablet; Refill: 0 - amphetamine-dextroamphetamine (ADDERALL) 5 MG tablet; Take 1 tablet (5 mg total) by mouth 2 (two) times daily as needed (ADHD symptoms).  Dispense: 60 tablet; Refill: 0 - amphetamine-dextroamphetamine (ADDERALL) 5 MG tablet; Take 1 tablet (5 mg total) by mouth 2 (two) times daily as needed (ADHD symptoms).  Dispense: 60 tablet; Refill: 0   General Counseling: Takoya verbalizes understanding of the findings of todays visit and agrees with plan of treatment. I have discussed any further diagnostic evaluation that may  be needed or ordered today. We also reviewed her medications today. she has been encouraged to call the office with any questions or concerns that should arise related to todays visit.    No orders of the defined types were placed in this encounter.   Meds ordered this encounter  Medications   ondansetron (ZOFRAN) 4 MG tablet    Sig: Take 1 tablet (4 mg total) by mouth every 8 (eight) hours as needed for nausea or vomiting.    Dispense:  30 tablet    Refill:  3   amphetamine-dextroamphetamine (ADDERALL) 5 MG tablet    Sig: Take 1 tablet (5 mg total) by mouth 2 (two) times daily as needed (ADHD symptoms).    Dispense:  60 tablet    Refill:  0    Fill for august   amphetamine-dextroamphetamine (ADDERALL) 5 MG tablet    Sig: Take 1 tablet (5 mg total) by mouth 2 (two) times daily as needed (ADHD symptoms).    Dispense:  60 tablet    Refill:  0    Fill for september   amphetamine-dextroamphetamine (ADDERALL) 5 MG tablet    Sig: Take 1 tablet (5 mg total) by mouth 2 (two) times daily as needed (ADHD symptoms).    Dispense:  60 tablet    Refill:  0    Fill for october    Return for previously scheduled, CPE, Janise Gora PCP on 03/26/22.   Total time spent:30 Minutes Time spent includes review of chart, medications, test results, and follow up plan with the patient.    Controlled Substance Database was reviewed by me.  This patient was seen by Jonetta Osgood, FNP-C in collaboration with Dr. Clayborn Bigness as a part of collaborative care agreement.   Gara Kincade R. Valetta Fuller, MSN, FNP-C Internal medicine

## 2022-01-28 ENCOUNTER — Ambulatory Visit (INDEPENDENT_AMBULATORY_CARE_PROVIDER_SITE_OTHER): Payer: Managed Care, Other (non HMO)

## 2022-01-28 DIAGNOSIS — E538 Deficiency of other specified B group vitamins: Secondary | ICD-10-CM

## 2022-01-28 MED ORDER — CYANOCOBALAMIN 1000 MCG/ML IJ SOLN
1000.0000 ug | Freq: Once | INTRAMUSCULAR | Status: AC
  Administered 2022-01-28: 1000 ug via INTRAMUSCULAR

## 2022-02-25 ENCOUNTER — Ambulatory Visit: Payer: Managed Care, Other (non HMO)

## 2022-03-08 ENCOUNTER — Other Ambulatory Visit: Payer: Self-pay | Admitting: Nurse Practitioner

## 2022-03-08 DIAGNOSIS — F411 Generalized anxiety disorder: Secondary | ICD-10-CM

## 2022-03-10 ENCOUNTER — Encounter: Payer: Self-pay | Admitting: Nurse Practitioner

## 2022-03-11 NOTE — Telephone Encounter (Signed)
Last 8/23 and next 03/26/22

## 2022-03-12 ENCOUNTER — Telehealth: Payer: Self-pay

## 2022-03-12 DIAGNOSIS — F411 Generalized anxiety disorder: Secondary | ICD-10-CM

## 2022-03-12 DIAGNOSIS — R41840 Attention and concentration deficit: Secondary | ICD-10-CM

## 2022-03-12 NOTE — Telephone Encounter (Signed)
Pt called [facility] are backorder for adderall and clonazepam she will [facility] first find out they have

## 2022-03-13 MED ORDER — AMPHETAMINE-DEXTROAMPHETAMINE 10 MG PO TABS: 5.0000 mg | ORAL_TABLET | Freq: Two times a day (BID) | ORAL | 0 refills | Status: DC

## 2022-03-13 MED ORDER — CLONAZEPAM 0.5 MG PO TABS: ORAL_TABLET | ORAL | 0 refills | Status: DC

## 2022-03-14 NOTE — Telephone Encounter (Signed)
Lmom that we send med °

## 2022-03-15 ENCOUNTER — Encounter: Payer: Managed Care, Other (non HMO) | Admitting: Nurse Practitioner

## 2022-03-18 ENCOUNTER — Ambulatory Visit: Payer: Managed Care, Other (non HMO) | Admitting: Nurse Practitioner

## 2022-03-20 ENCOUNTER — Ambulatory Visit (INDEPENDENT_AMBULATORY_CARE_PROVIDER_SITE_OTHER): Payer: Managed Care, Other (non HMO) | Admitting: Nurse Practitioner

## 2022-03-20 ENCOUNTER — Encounter: Payer: Self-pay | Admitting: Nurse Practitioner

## 2022-03-20 VITALS — BP 130/80 | HR 80 | Temp 98.2°F | Resp 16 | Ht 62.0 in | Wt 177.2 lb

## 2022-03-20 DIAGNOSIS — Z23 Encounter for immunization: Secondary | ICD-10-CM | POA: Diagnosis not present

## 2022-03-20 DIAGNOSIS — Z760 Encounter for issue of repeat prescription: Secondary | ICD-10-CM

## 2022-03-20 DIAGNOSIS — E538 Deficiency of other specified B group vitamins: Secondary | ICD-10-CM | POA: Diagnosis not present

## 2022-03-20 MED ORDER — ONDANSETRON HCL 4 MG PO TABS
4.0000 mg | ORAL_TABLET | Freq: Three times a day (TID) | ORAL | 3 refills | Status: DC | PRN
Start: 2022-03-20 — End: 2022-06-24

## 2022-03-20 MED ORDER — CYANOCOBALAMIN 1000 MCG/ML IJ SOLN
1000.0000 ug | Freq: Once | INTRAMUSCULAR | Status: AC
  Administered 2022-03-20: 1000 ug via INTRAMUSCULAR

## 2022-03-20 NOTE — Progress Notes (Signed)
[facility] [place],  [address]  Internal MEDICINE  Office Visit Note  Patient Name: [name]  [id number]  [id number]  Date of Service: 03/20/2022  Chief Complaint  Patient presents with   Follow-up    Wellness for [facility].    Hyperlipidemia   Gastroesophageal Reflux    HPI Danyah presents for a follow up visit for wellness at [facility] and med refills.  Wellness paperwork ---will bring later Due to difficulty with getting prescription for adderall filled due to shortage of supply, she ended up filling 1 script at [facility] and 1 script [facility]. She reported this first thing during her office visit and is taking the medication as prescribed. She will not need a refill until mid to late December.  --due for B12 injection --requests flu vaccine --has upcoming office visit on 10/24 for annual wellness visit and physical exam. Due for routine labs   Current Medication: Outpatient Encounter Medications as of 03/20/2022  Medication Sig   amphetamine-dextroamphetamine (ADDERALL) 10 MG tablet Take 0.5 tablets (5 mg total) by mouth 2 (two) times daily.   amphetamine-dextroamphetamine (ADDERALL) 5 MG tablet Take 1 tablet (5 mg total) by mouth 2 (two) times daily as needed (ADHD symptoms).   amphetamine-dextroamphetamine (ADDERALL) 5 MG tablet Take 1 tablet (5 mg total) by mouth 2 (two) times daily as needed (ADHD symptoms).   amphetamine-dextroamphetamine (ADDERALL) 5 MG tablet Take 1 tablet (5 mg total) by mouth 2 (two) times daily as needed (ADHD symptoms).   atorvastatin (LIPITOR) 40 MG tablet TAKE 1 TABLET BY MOUTH  DAILY AT 6 PM.   busPIRone (BUSPAR) 5 MG tablet Take 1 tablet (5 mg total) by mouth 2 (two) times daily as needed (anxiety).   clonazePAM (KLONOPIN) 0.5 MG tablet TAKE 1 TABLET(0.5 MG) BY MOUTH AT BEDTIME AS NEEDED FOR ANXIETY   clotrimazole-betamethasone (LOTRISONE) cream Apply 1 Application topically 2 (two) times daily. Until resolved    diphenoxylate-atropine (LOMOTIL) 2.5-0.025 MG tablet Take 1 tablet by mouth 4 (four) times daily as needed for diarrhea or loose stools.   escitalopram (LEXAPRO) 20 MG tablet TAKE 1 TABLET BY MOUTH  DAILY   fenofibrate 54 MG tablet Take 1 tablet (54 mg total) by mouth daily.   fluticasone (FLONASE) 50 MCG/ACT nasal spray Place 2 sprays into both nostrils daily.   ibuprofen (ADVIL) 800 MG tablet Take 1 tablet (800 mg total) by mouth 3 (three) times daily.   meloxicam (MOBIC) 15 MG tablet Take 1 tablet (15 mg total) by mouth at bedtime.   omeprazole (PRILOSEC) 40 MG capsule Take 1 capsule (40 mg total) by mouth every morning.   [DISCONTINUED] ondansetron (ZOFRAN) 4 MG tablet Take 1 tablet (4 mg total) by mouth every 8 (eight) hours as needed for nausea or vomiting.   ondansetron (ZOFRAN) 4 MG tablet Take 1 tablet (4 mg total) by mouth every 8 (eight) hours as needed for nausea or vomiting.   Facility-Administered Encounter Medications as of 03/20/2022  Medication   cyanocobalamin ((VITAMIN B-12)) injection 1,000 mcg   [COMPLETED] cyanocobalamin (VITAMIN B12) injection 1,000 mcg    Surgical History: Past Surgical History:  Procedure Laterality Date   CARPAL TUNNEL RELEASE Right 11/22/2019   Procedure: CARPAL TUNNEL RELEASE;  Surgeon: Earnestine Leys, MD;  Location: [facility] ORS;  Service: Orthopedics;  Laterality: Right;   COLONOSCOPY     TOE SURGERY  2005,2011   ingrown toenails in doctors office   UPPER GI ENDOSCOPY  Medical History: Past Medical History:  Diagnosis Date   ADHD    Allergy    Anxiety    Colon polyp    Diverticulosis    GERD (gastroesophageal reflux disease)    Hyperlipidemia     Family History: Family History  Problem Relation Age of Onset   Lung cancer Mother    Heart disease Father    Pancreatic cancer Sister     Social History   Socioeconomic History   Marital status: Married    Spouse name: Not on file   Number of children: Not on file   Years  of education: Not on file   Highest education level: Not on file  Occupational History   Not on file  Tobacco Use   Smoking status: Never   Smokeless tobacco: Never  Vaping Use   Vaping Use: Never used  Substance and Sexual Activity   Alcohol use: Yes    Comment: ocassionally    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  HENT:  Negative for congestion, rhinorrhea, sneezing and sore throat.   Eyes:  Negative for redness.  Respiratory: Negative.  Negative for cough, chest tightness, shortness of breath and wheezing.   Cardiovascular: Negative.  Negative for chest pain and palpitations.  Gastrointestinal:  Positive for nausea. Negative for abdominal pain, constipation, diarrhea and vomiting.  Genitourinary:  Negative for dysuria and frequency.  Musculoskeletal:  Negative for arthralgias, back pain, joint swelling and neck pain.  Skin:  Negative for rash.  Neurological: Negative.  Negative for tremors and numbness.  Hematological:  Negative for adenopathy. Does not bruise/bleed easily.  Psychiatric/Behavioral:  Negative for behavioral problems (Depression), sleep disturbance and suicidal ideas. The patient is not nervous/anxious.     Vital Signs: BP 130/80   Pulse 80   Temp 98.2 F (36.8 C)   Resp 16   Ht '5\' 2"'$  (1.575 m)   Wt 177 lb 3.2 oz (80.4 kg)   SpO2 98%   BMI 32.41 kg/m    Physical Exam Vitals reviewed.  Constitutional:      General: She is not in acute distress.     Appearance: Normal appearance. She is not ill-appearing.  HENT:     Head: Normocephalic and atraumatic.  Eyes:     Pupils: Pupils are equal, round, and reactive to light.  Cardiovascular:     Rate and Rhythm: Normal rate and regular rhythm.  Pulmonary:     Effort: Pulmonary effort is normal. No respiratory distress.  Neurological:     Mental Status: She is alert and oriented to person, place, and time.  Psychiatric:        Mood and Affect: Mood normal.        Behavior: Behavior normal.        Assessment/Plan: 1. B12 deficiency B12 injection administered in office - cyanocobalamin (VITAMIN B12) injection 1,000 mcg  2. Needs flu shot Flu vaccine  given in office - Flu Vaccine MDCK QUAD PF  3. Medication refill - ondansetron (ZOFRAN) 4 MG tablet; Take 1 tablet (4 mg total) by mouth every 8 (eight) hours as needed for nausea or vomiting.  Dispense: 30 tablet; Refill: 3   General Counseling: Marykathleen verbalizes understanding of the findings of todays visit and agrees with plan of treatment. I have discussed any further diagnostic evaluation that may be needed or ordered today. We also reviewed her medications today. she has been encouraged to call the office with any questions or concerns that should arise related to todays visit.    Orders Placed This Encounter  Procedures   Flu Vaccine MDCK QUAD PF    Meds ordered this encounter  Medications   cyanocobalamin (VITAMIN B12) injection 1,000 mcg   ondansetron (ZOFRAN) 4 MG tablet    Sig: Take 1 tablet (4 mg total) by mouth every 8 (eight) hours as needed for nausea or vomiting.    Dispense:  30 tablet    Refill:  3    Please fill today asap    Return in 6 days (on 03/26/2022) for previously scheduled, CPE, Domanik [place] PCP.   Total time spent:30 Minutes Time spent includes review of chart, medications, test results, and follow up plan with the patient.   [place] Controlled Substance Database was reviewed by me.  This patient was  seen by Jonetta Osgood, FNP-C in collaboration with Dr. Clayborn Bigness as a part of collaborative care agreement.   Tequia Wolman R. Valetta Fuller, MSN, FNP-C Internal medicine

## 2022-03-26 ENCOUNTER — Telehealth: Payer: Self-pay | Admitting: Nurse Practitioner

## 2022-03-26 ENCOUNTER — Ambulatory Visit: Payer: Managed Care, Other (non HMO) | Admitting: Nurse Practitioner

## 2022-03-26 ENCOUNTER — Encounter: Payer: Self-pay | Admitting: Nurse Practitioner

## 2022-03-26 VITALS — BP 121/77 | HR 64 | Temp 98.1°F | Resp 16 | Ht 62.0 in | Wt 177.2 lb

## 2022-03-26 DIAGNOSIS — Z1231 Encounter for screening mammogram for malignant neoplasm of breast: Secondary | ICD-10-CM

## 2022-03-26 DIAGNOSIS — R300 Dysuria: Secondary | ICD-10-CM | POA: Diagnosis not present

## 2022-03-26 DIAGNOSIS — Z1211 Encounter for screening for malignant neoplasm of colon: Secondary | ICD-10-CM

## 2022-03-26 DIAGNOSIS — Z0001 Encounter for general adult medical examination with abnormal findings: Secondary | ICD-10-CM

## 2022-03-26 DIAGNOSIS — D229 Melanocytic nevi, unspecified: Secondary | ICD-10-CM

## 2022-03-26 DIAGNOSIS — Z23 Encounter for immunization: Secondary | ICD-10-CM

## 2022-03-26 DIAGNOSIS — E2839 Other primary ovarian failure: Secondary | ICD-10-CM | POA: Diagnosis not present

## 2022-03-26 DIAGNOSIS — Z1212 Encounter for screening for malignant neoplasm of rectum: Secondary | ICD-10-CM

## 2022-03-26 MED ORDER — PNEUMOCOCCAL 20-VAL CONJ VACC 0.5 ML IM SUSY: 0.5000 mL | PREFILLED_SYRINGE | INTRAMUSCULAR | 0 refills | Status: AC

## 2022-03-26 MED ORDER — ZOSTER VAC RECOMB ADJUVANTED 50 MCG/0.5ML IM SUSR: 0.5000 mL | Freq: Once | INTRAMUSCULAR | 0 refills | Status: AC

## 2022-03-26 NOTE — Telephone Encounter (Signed)
Awaiting 03/26/22 office notes for GI referral-[name]

## 2022-03-26 NOTE — Progress Notes (Signed)
[facility] [place], [place] [address]  Internal MEDICINE  Office Visit Note  Patient Name: [name]  [id number]  [id number]  Date of Service: 03/26/2022  Chief Complaint  Patient presents with   Annual Exam   Gastroesophageal Reflux   Hyperlipidemia    HPI [name] presents for an annual well visit and physical exam. Well-appearing 66 year old female with ADHD, low back pain, GERD, OSA, GAD, high cholesterol, and rhinitis. --due for mammogram --due for DEXA scan --due for routine colonoscopy, wants GI referral --wants shingles and pneumonia vaccine as well.    Functional Status Survey: Is the patient deaf or have difficulty hearing?: No Does the patient have difficulty seeing, even when wearing glasses/contacts?: No Does the patient have difficulty concentrating, remembering, or making decisions?: No Does the patient have difficulty walking or climbing stairs?: No Does the patient have difficulty dressing or bathing?: No Does the patient have difficulty doing errands alone such as visiting a doctor's office or shopping?: No     03/26/2022   10:04 AM  Depression screen PHQ 2/9  Decreased Interest 0  Down, Depressed, Hopeless 0  PHQ - 2 Score 0        Current Medication: Outpatient Encounter Medications as of 03/26/2022  Medication Sig   amphetamine-dextroamphetamine (ADDERALL) 10 MG tablet Take 0.5 tablets (5 mg total) by mouth 2 (two) times daily.   amphetamine-dextroamphetamine (ADDERALL) 5 MG tablet Take 1 tablet (5 mg total) by mouth 2 (two) times daily as needed (ADHD symptoms).   amphetamine-dextroamphetamine (ADDERALL) 5 MG tablet Take 1 tablet (5 mg total) by mouth 2 (two) times daily as needed (ADHD symptoms).   amphetamine-dextroamphetamine (ADDERALL) 5 MG tablet Take 1 tablet (5 mg total) by mouth 2 (two) times daily as needed (ADHD symptoms).   atorvastatin (LIPITOR) 40 MG tablet TAKE 1 TABLET BY MOUTH  DAILY AT 6 PM.    busPIRone (BUSPAR) 5 MG tablet Take 1 tablet (5 mg total) by mouth 2 (two) times daily as needed (anxiety).   clonazePAM (KLONOPIN) 0.5 MG tablet TAKE 1 TABLET(0.5 MG) BY MOUTH AT BEDTIME AS NEEDED FOR ANXIETY   clotrimazole-betamethasone (LOTRISONE) cream Apply 1 Application topically 2 (two) times daily. Until resolved   diphenoxylate-atropine (LOMOTIL) 2.5-0.025 MG tablet Take 1 tablet by mouth 4 (four) times daily as needed for diarrhea or loose stools.   escitalopram (LEXAPRO) 20 MG tablet TAKE 1 TABLET BY MOUTH  DAILY   fenofibrate 54 MG tablet Take 1 tablet (54 mg total) by mouth daily.   fluticasone (FLONASE) 50 MCG/ACT nasal spray Place 2 sprays into both nostrils daily.   ibuprofen (ADVIL) 800 MG tablet Take 1 tablet (800 mg total) by mouth 3 (three) times daily.   meloxicam (MOBIC) 15 MG tablet Take 1 tablet (15 mg total) by mouth at bedtime.   omeprazole (PRILOSEC) 40 MG capsule Take 1 capsule (40 mg total) by mouth every morning.   ondansetron (ZOFRAN) 4 MG tablet Take 1 tablet (4 mg total) by mouth every 8 (eight) hours as needed for nausea or vomiting.   pneumococcal 20-valent conjugate vaccine (PREVNAR 20) 0.5 ML injection Inject 0.5 mLs into the muscle tomorrow at 10 am for 1 dose.   [DISCONTINUED] PNEUMOCOCCAL 20-VAL CONJ VACC IM Inject into the muscle.   [DISCONTINUED] Zoster Vaccine Adjuvanted [facility]) injection Inject 0.5 mLs into the muscle once.   Zoster Vaccine Adjuvanted [facility]) injection Inject 0.5 mLs into the muscle once for 1 dose.   Facility-Administered  Encounter Medications as of 03/26/2022  Medication   cyanocobalamin ((VITAMIN B-12)) injection 1,000 mcg    Surgical History: Past Surgical History:  Procedure Laterality Date   CARPAL TUNNEL RELEASE Right 11/22/2019   Procedure: CARPAL TUNNEL RELEASE;  Surgeon: Earnestine Leys, MD;  Location: [facility] ORS;  Service: Orthopedics;  Laterality: Right;   COLONOSCOPY     TOE SURGERY  2005,2011   ingrown toenails  in doctors office   UPPER GI ENDOSCOPY      Medical History: Past Medical History:  Diagnosis Date   ADHD    Allergy    Anxiety    Colon polyp    Diverticulosis    GERD (gastroesophageal reflux disease)    Hyperlipidemia     Family History: Family History  Problem Relation Age of Onset   Lung cancer Mother    Heart disease Father    Pancreatic cancer Sister     Social History   Socioeconomic History   Marital status: Married    Spouse name: Not on file   Number of children: Not on file   Years of education: Not on file   Highest education level: Not on file  Occupational History   Not on file  Tobacco Use   Smoking status: Never   Smokeless tobacco: Never  Vaping Use   Vaping Use: Never used  Substance and Sexual Activity   Alcohol use: Yes    Comment: ocassionally    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  HENT: Negative.  Negative for congestion, ear pain, rhinorrhea, sore throat and trouble swallowing.   Eyes: Negative.   Respiratory: Negative.  Negative for cough, chest tightness, shortness of breath and wheezing.   Cardiovascular: Negative.  Negative for chest pain.  Gastrointestinal: Negative.  Negative for abdominal pain, blood in stool, constipation, diarrhea, nausea and vomiting.  Endocrine: Negative.   Genitourinary: Negative.  Negative for difficulty urinating, dysuria, frequency, hematuria and urgency.   Musculoskeletal: Negative.  Negative for arthralgias, back pain, joint swelling, myalgias and neck pain.  Skin:  Negative for rash.  Allergic/Immunologic: Negative.  Negative for immunocompromised state.  Neurological: Negative.  Negative for dizziness, seizures, numbness and headaches.  Hematological: Negative.   Psychiatric/Behavioral: Negative.  Negative for behavioral problems, self-injury and suicidal ideas. The patient is not nervous/anxious.     Vital Signs: BP 121/77   Pulse 64   Temp 98.1 F (36.7 C)   Resp 16   Ht '5\' 2"'$  (1.575 m)   Wt 177 lb 3.2 oz (80.4 kg)   SpO2 99%   BMI 32.41 kg/m    Physical Exam Vitals reviewed.  Constitutional:      General: She is not in acute distress.    Appearance: She is well-developed. She is not diaphoretic.  HENT:     Head: Normocephalic and atraumatic.     Right Ear: Tympanic membrane, ear canal  and external ear normal.     Left Ear: Tympanic membrane, ear canal and external ear normal.     Nose: Nose normal. No congestion or rhinorrhea.     Mouth/Throat:     Mouth: Mucous membranes are moist.     Pharynx: Oropharynx is clear. No oropharyngeal exudate or posterior oropharyngeal erythema.  Eyes:     General: No scleral icterus.       Right eye: No discharge.        Left eye: No discharge.     Extraocular Movements: Extraocular movements intact.     Conjunctiva/sclera: Conjunctivae normal.     Pupils: Pupils are equal, round, and reactive to light.  Neck:     Thyroid: No thyromegaly.     Vascular: No JVD.     Trachea: No tracheal deviation.  Cardiovascular:     Rate and Rhythm: Normal rate and regular rhythm.     Heart sounds: Normal heart sounds. No murmur heard.    No friction rub. No gallop.  Pulmonary:     Effort: Pulmonary effort is normal. No respiratory distress.     Breath sounds: Normal breath sounds. No stridor. No wheezing or rales.  Chest:     Chest wall: No tenderness.  Abdominal:     General: Bowel sounds  are normal. There is no distension.     Palpations: Abdomen is soft. There is no mass.     Tenderness: There is no abdominal tenderness. There is no guarding or rebound.  Musculoskeletal:        General: No tenderness or deformity. Normal range of motion.     Cervical back: Normal range of motion and neck supple.  Lymphadenopathy:     Cervical: No cervical adenopathy.  Skin:    General: Skin is warm and dry.     Coloration: Skin is not pale.     Findings: No erythema or rash.  Neurological:     Mental Status: She is alert and oriented to person, place, and time.     Cranial Nerves: No cranial nerve deficit.     Motor: No abnormal muscle tone.     Coordination: Coordination normal.     Deep Tendon Reflexes: Reflexes are normal and symmetric.  Psychiatric:        Behavior: Behavior normal.        Thought Content: Thought content normal.        Judgment: Judgment normal.        Assessment/Plan: 1. Encounter for general adult medical examination with abnormal findings Age-appropriate preventive screenings and vaccinations discussed, annual physical exam completed. Routine labs for health maintenance deferred per patient request, will do them later next year. PHM updated.   2. Multiple atypical skin moles Referred to derm for skin moles and TBSE - Ambulatory referral to Dermatology  3. 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 Dysuria Routine urinalysis done - UA/M w/rflx Culture, Routine - Microscopic Examination  5. Screening for colorectal cancer Referred to GI - Ambulatory referral to Gastroenterology  6. Encounter for screening mammogram for malignant neoplasm of breast Routine mammogram ordered - MM 3D SCREEN BREAST BILATERAL; Future  7. Need for vaccination - Zoster Vaccine Adjuvanted [facility]) injection; Inject 0.5 mLs into the muscle once for 1 dose.  Dispense: 0.5 mL; Refill: 0 - pneumococcal 20-valent  conjugate vaccine (PREVNAR 20) 0.5 ML injection; Inject 0.5 mLs into the muscle tomorrow at 10 am for 1 dose.  Dispense: 0.5 mL; Refill: 0      General Counseling: [name] verbalizes understanding of the findings of todays visit and agrees with plan of treatment. I have discussed any further diagnostic evaluation that may be needed or ordered today. We also reviewed her medications today. she has been encouraged to call the office with any questions or concerns that should arise related to todays visit.    Orders Placed This Encounter  Procedures   DG Bone Density   MM 3D SCREEN BREAST BILATERAL   UA/M w/rflx Culture, Routine   Ambulatory referral to Gastroenterology   Ambulatory referral to Dermatology    Meds ordered this encounter  Medications   Zoster Vaccine Adjuvanted [facility]) injection    Sig: Inject 0.5 mLs into the muscle once for 1 dose.    Dispense:  0.5 mL    Refill:  0   pneumococcal 20-valent conjugate vaccine (PREVNAR 20) 0.5 ML injection    Sig: Inject 0.5 mLs into the muscle tomorrow at 10 am for 1 dose.    Dispense:  0.5 mL    Refill:  0    Return in about 3 months (around 06/12/2022) for F/U, anxiety med refill, [name] PCP.   Total time spent:30 Minutes Time spent includes review of chart, medications, test results, and follow up plan with the patient.   [place] Controlled Substance Database was reviewed by me.  This patient was seen by Jonetta Osgood, FNP-C in collaboration with Dr. Clayborn Bigness as a part of collaborative care agreement.  Quillan Whitter R. Valetta Fuller, MSN, FNP-C Internal medicine

## 2022-03-26 NOTE — Telephone Encounter (Signed)
Awaiting 03/26/22 office notes for Dermatology referral-[name]

## 2022-03-27 LAB — UA/M W/RFLX CULTURE, ROUTINE
Bilirubin, UA: NEGATIVE
Glucose, UA: NEGATIVE
Ketones, UA: NEGATIVE
Leukocytes,UA: NEGATIVE
Nitrite, UA: NEGATIVE
Protein,UA: NEGATIVE
RBC, UA: NEGATIVE
Specific Gravity, UA: 1.005 (ref 1.005–1.030)
Urobilinogen, Ur: 0.2 mg/dL (ref 0.2–1.0)
pH, UA: 7 (ref 5.0–7.5)

## 2022-03-27 LAB — MICROSCOPIC EXAMINATION
Bacteria, UA: NONE SEEN
Casts: NONE SEEN /lpf
Epithelial Cells (non renal): NONE SEEN /hpf (ref 0–10)
WBC, UA: NONE SEEN /hpf (ref 0–5)

## 2022-04-01 ENCOUNTER — Encounter (INDEPENDENT_AMBULATORY_CARE_PROVIDER_SITE_OTHER): Payer: Self-pay

## 2022-05-04 ENCOUNTER — Encounter: Payer: Self-pay | Admitting: Nurse Practitioner

## 2022-05-07 ENCOUNTER — Other Ambulatory Visit: Payer: Self-pay

## 2022-05-07 DIAGNOSIS — F411 Generalized anxiety disorder: Secondary | ICD-10-CM

## 2022-05-07 MED ORDER — ESCITALOPRAM OXALATE 20 MG PO TABS: 20.0000 mg | ORAL_TABLET | Freq: Every day | ORAL | 1 refills | Status: DC

## 2022-05-07 MED ORDER — ESCITALOPRAM OXALATE 20 MG PO TABS: 20.0000 mg | ORAL_TABLET | Freq: Every day | ORAL | 0 refills | Status: DC

## 2022-05-08 ENCOUNTER — Telehealth: Payer: Self-pay | Admitting: Nurse Practitioner

## 2022-05-08 NOTE — Telephone Encounter (Signed)
Gastroenterology referral sent via [facility] to [facility]

## 2022-05-08 NOTE — Telephone Encounter (Signed)
Dermatology referral sent via [facility] to [facility] -[name] 

## 2022-06-10 ENCOUNTER — Telehealth: Payer: Self-pay | Admitting: Nurse Practitioner

## 2022-06-10 NOTE — Telephone Encounter (Signed)
GI appointment>> 09/03/22 with KC-[name]

## 2022-06-24 ENCOUNTER — Encounter: Payer: Self-pay | Admitting: Nurse Practitioner

## 2022-06-24 ENCOUNTER — Ambulatory Visit: Payer: Managed Care, Other (non HMO) | Admitting: Nurse Practitioner

## 2022-06-24 VITALS — BP 139/82 | HR 67 | Temp 96.9°F | Resp 16 | Ht 62.0 in | Wt 181.2 lb

## 2022-06-24 DIAGNOSIS — K219 Gastro-esophageal reflux disease without esophagitis: Secondary | ICD-10-CM

## 2022-06-24 DIAGNOSIS — F411 Generalized anxiety disorder: Secondary | ICD-10-CM | POA: Diagnosis not present

## 2022-06-24 DIAGNOSIS — Z79899 Other long term (current) drug therapy: Secondary | ICD-10-CM

## 2022-06-24 DIAGNOSIS — R41840 Attention and concentration deficit: Secondary | ICD-10-CM | POA: Diagnosis not present

## 2022-06-24 MED ORDER — CLONAZEPAM 0.5 MG PO TABS: ORAL_TABLET | ORAL | 0 refills | Status: DC

## 2022-06-24 MED ORDER — AMPHETAMINE-DEXTROAMPHETAMINE 5 MG PO TABS: 1.0000 | ORAL_TABLET | Freq: Two times a day (BID) | ORAL | 0 refills | Status: DC | PRN

## 2022-06-24 MED ORDER — BUSPIRONE HCL 5 MG PO TABS: 5.0000 mg | ORAL_TABLET | Freq: Two times a day (BID) | ORAL | 1 refills | Status: DC | PRN

## 2022-06-24 MED ORDER — OMEPRAZOLE 40 MG PO CPDR: 40.0000 mg | DELAYED_RELEASE_CAPSULE | Freq: Every morning | ORAL | 3 refills | Status: DC

## 2022-06-24 MED ORDER — ATORVASTATIN CALCIUM 40 MG PO TABS: ORAL_TABLET | ORAL | 3 refills | Status: DC

## 2022-06-24 MED ORDER — MELOXICAM 15 MG PO TABS: 15.0000 mg | ORAL_TABLET | Freq: Every day | ORAL | 3 refills | Status: DC

## 2022-06-24 MED ORDER — FENOFIBRATE 54 MG PO TABS: 54.0000 mg | ORAL_TABLET | Freq: Every day | ORAL | 3 refills | Status: DC

## 2022-06-24 MED ORDER — ONDANSETRON HCL 4 MG PO TABS: 4.0000 mg | ORAL_TABLET | Freq: Three times a day (TID) | ORAL | 3 refills | Status: DC | PRN

## 2022-06-24 NOTE — Progress Notes (Signed)
[facility] [place], [place] [address]  Internal MEDICINE  Office Visit Note  Patient Name: [name]  [id number]  [id number]  Date of Service: 06/24/2022  Chief Complaint  Patient presents with   Hyperlipidemia   Gastroesophageal Reflux    HPI Agnieszka presents for a follow-up visit for med refills, ADHD, anxiety and nausea.  ADHD -- current dose is effective, HR and BP are good, denies palpitations and any other side effects of the medication.  Anxiety -- takes clonazepam as needed . Nausea and bloating -- takes omeprazole, zofran, and has an upcoming appt with GI.    Current Medication: Outpatient Encounter Medications as of 06/24/2022  Medication Sig   clotrimazole-betamethasone (LOTRISONE) cream Apply 1 Application topically 2 (two) times daily. Until resolved   diphenoxylate-atropine (LOMOTIL) 2.5-0.025 MG tablet Take 1 tablet by mouth 4 (four) times daily as needed for diarrhea or loose stools.   escitalopram (LEXAPRO) 20 MG tablet Take 1 tablet (20 mg total) by mouth daily.   fluticasone (FLONASE) 50 MCG/ACT nasal spray Place 2 sprays into both nostrils daily.   ibuprofen (ADVIL) 800 MG tablet Take 1 tablet (800 mg total) by mouth 3 (three) times daily.   [DISCONTINUED] amphetamine-dextroamphetamine (ADDERALL) 10 MG tablet Take 0.5 tablets (5 mg total) by mouth 2 (two) times daily.   [DISCONTINUED] amphetamine-dextroamphetamine (ADDERALL) 5 MG tablet Take 1 tablet (5 mg total) by mouth 2 (two) times daily as needed (ADHD symptoms).   [DISCONTINUED] amphetamine-dextroamphetamine (ADDERALL) 5 MG tablet Take 1 tablet (5 mg total) by mouth 2 (two) times daily as needed (ADHD symptoms).   [DISCONTINUED] amphetamine-dextroamphetamine (ADDERALL) 5 MG tablet Take 1 tablet (5 mg total) by mouth 2 (two) times daily as needed (ADHD symptoms).   [DISCONTINUED] atorvastatin (LIPITOR) 40 MG tablet TAKE 1 TABLET BY MOUTH  DAILY AT 6 PM.   [DISCONTINUED]  busPIRone (BUSPAR) 5 MG tablet Take 1 tablet (5 mg total) by mouth 2 (two) times daily as needed (anxiety).   [DISCONTINUED] clonazePAM (KLONOPIN) 0.5 MG tablet TAKE 1 TABLET(0.5 MG) BY MOUTH AT BEDTIME AS NEEDED FOR ANXIETY   [DISCONTINUED] fenofibrate 54 MG tablet Take 1 tablet (54 mg total) by mouth daily.   [DISCONTINUED] meloxicam (MOBIC) 15 MG tablet Take 1 tablet (15 mg total) by mouth at bedtime.   [DISCONTINUED] omeprazole (PRILOSEC) 40 MG capsule Take 1 capsule (40 mg total) by mouth every morning.   [DISCONTINUED] ondansetron (ZOFRAN) 4 MG tablet Take 1 tablet (4 mg total) by mouth every 8 (eight) hours as needed for nausea or vomiting.   [START ON 07/22/2022] amphetamine-dextroamphetamine (ADDERALL) 5 MG tablet Take 1 tablet (5 mg total) by mouth 2 (two) times daily as needed (ADHD symptoms).   [START ON 08/19/2022] amphetamine-dextroamphetamine (ADDERALL) 5 MG tablet Take 1 tablet (5 mg total) by mouth 2 (two) times daily as needed (ADHD symptoms).   amphetamine-dextroamphetamine (ADDERALL) 5 MG tablet Take 1 tablet (5 mg total) by mouth 2 (two) times daily as needed (ADHD symptoms).   atorvastatin (LIPITOR) 40 MG tablet TAKE 1 TABLET BY MOUTH  DAILY AT 6 PM.   busPIRone (BUSPAR) 5 MG tablet Take 1 tablet (5 mg total) by mouth 2 (two) times daily as needed (anxiety).   clonazePAM (KLONOPIN) 0.5 MG tablet TAKE 1 TABLET(0.5 MG) BY MOUTH AT BEDTIME AS NEEDED FOR ANXIETY   fenofibrate 54 MG tablet Take 1 tablet (54 mg total) by mouth daily.   meloxicam (MOBIC) 15 MG tablet Take 1 tablet (15  mg total) by mouth at bedtime.   omeprazole (PRILOSEC) 40 MG capsule Take 1 capsule (40 mg total) by mouth every morning.   ondansetron (ZOFRAN) 4 MG tablet Take 1 tablet (4 mg total) by mouth every 8 (eight) hours as needed for nausea or vomiting.   Facility-Administered Encounter Medications as of 06/24/2022  Medication   cyanocobalamin ((VITAMIN B-12)) injection 1,000 mcg    Surgical History: Past  Surgical History:  Procedure Laterality Date   CARPAL TUNNEL RELEASE Right 11/22/2019   Procedure: CARPAL TUNNEL RELEASE;  Surgeon: Earnestine Leys, MD;  Location: [facility] ORS;  Service: Orthopedics;  Laterality: Right;   COLONOSCOPY     TOE SURGERY  2005,2011   ingrown toenails in doctors office   UPPER GI ENDOSCOPY      Medical History: Past Medical History:  Diagnosis Date   ADHD    Allergy    Anxiety    Colon polyp    Diverticulosis    GERD (gastroesophageal reflux disease)    Hyperlipidemia     Family History: Family History  Problem Relation Age of Onset   Lung cancer Mother    Heart disease Father    Pancreatic cancer Sister     Social History   Socioeconomic History   Marital status: Married    Spouse name: Not on file   Number of children: Not on file   Years of education: Not on file   Highest education level: Not on file  Occupational History   Not on file  Tobacco Use   Smoking status: Never   Smokeless tobacco: Never  Vaping Use   Vaping Use: Never used  Substance and Sexual Activity   Alcohol use: Yes    Comment: ocassionally    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  HENT:  Negative for congestion, rhinorrhea, sneezing and sore throat.   Eyes:  Negative for redness.  Respiratory: Negative.  Negative for cough, chest tightness, shortness of breath and wheezing.   Cardiovascular: Negative.  Negative for chest pain and palpitations.  Gastrointestinal:   Positive for nausea. Negative for abdominal pain, constipation, diarrhea and vomiting.  Genitourinary:  Negative for dysuria and frequency.  Musculoskeletal:  Negative for arthralgias, back pain, joint swelling and neck pain.  Skin:  Negative for rash.  Neurological: Negative.  Negative for tremors and numbness.  Hematological:  Negative for adenopathy. Does not bruise/bleed easily.  Psychiatric/Behavioral:  Positive for decreased concentration. Negative for behavioral problems (Depression), sleep disturbance and suicidal ideas. The patient is not nervous/anxious.     Vital Signs: BP 139/82   Pulse 67   Temp (!) 96.9 F (36.1 C)   Resp 16   Ht '5\' 2"'$  (1.575 m)   Wt 181 lb 3.2 oz (82.2 kg)   SpO2 97%   BMI 33.14 kg/m    Physical Exam Vitals reviewed.  Constitutional:      General: She is  not in acute distress.    Appearance: Normal appearance. She is not ill-appearing.  HENT:     Head: Normocephalic and atraumatic.  Eyes:     Pupils: Pupils are equal, round, and reactive to light.  Cardiovascular:     Rate and Rhythm: Normal rate and regular rhythm.  Pulmonary:     Effort: Pulmonary effort is normal. No respiratory distress.  Neurological:     Mental Status: She is alert and oriented to person, place, and time.  Psychiatric:        Mood and Affect: Mood normal.        Behavior: Behavior normal.       Assessment/Plan: 1. Gastroesophageal reflux disease without esophagitis Stable, continue omeprazole daily as prescribed. May still take zofran as needed for nausea.  - ondansetron (ZOFRAN) 4 MG tablet; Take 1 tablet (4 mg total) by mouth every 8 (eight) hours as needed for nausea or vomiting.  Dispense: 30 tablet; Refill: 3 - omeprazole (PRILOSEC) 40 MG capsule; Take 1 capsule (40 mg total) by mouth every morning.  Dispense: 90 capsule; Refill: 3  2. Encounter for medication review Medication list reviewed, needed refills ordered - ondansetron (ZOFRAN) 4 MG tablet; Take  1 tablet (4 mg total) by mouth every 8 (eight) hours as needed for nausea or vomiting.  Dispense: 30 tablet; Refill: 3 - meloxicam (MOBIC) 15 MG tablet; Take 1 tablet (15 mg total) by mouth at bedtime.  Dispense: 90 tablet; Refill: 3 - omeprazole (PRILOSEC) 40 MG capsule; Take 1 capsule (40 mg total) by mouth every morning.  Dispense: 90 capsule; Refill: 3 - fenofibrate 54 MG tablet; Take 1 tablet (54 mg total) by mouth daily.  Dispense: 90 tablet; Refill: 3 - atorvastatin (LIPITOR) 40 MG tablet; TAKE 1 TABLET BY MOUTH  DAILY AT 6 PM.  Dispense: 90 tablet; Refill: 3 - busPIRone (BUSPAR) 5 MG tablet; Take 1 tablet (5 mg total) by mouth 2 (two) times daily as needed (anxiety).  Dispense: 180 tablet; Refill: 1  3. GAD (generalized anxiety disorder) Stable, continue clonazepam as needed - clonazePAM (KLONOPIN) 0.5 MG tablet; TAKE 1 TABLET(0.5 MG) BY MOUTH AT BEDTIME AS NEEDED FOR ANXIETY  Dispense: 20 tablet; Refill: 0  4. Attention and concentration deficit Refills x3 months ordered, follow up in 3 months for additional refills - amphetamine-dextroamphetamine (ADDERALL) 5 MG tablet; Take 1 tablet (5 mg total) by mouth 2 (two) times daily as needed (ADHD symptoms).  Dispense: 60 tablet; Refill: 0 - amphetamine-dextroamphetamine (ADDERALL) 5 MG tablet; Take 1 tablet (5 mg total) by mouth 2 (two) times daily as needed (ADHD symptoms).  Dispense: 60 tablet; Refill: 0 - amphetamine-dextroamphetamine (ADDERALL) 5 MG tablet; Take 1 tablet (5 mg total) by mouth 2 (two) times daily as needed (ADHD symptoms).  Dispense: 60 tablet; Refill: 0   General Counseling: Michelle verbalizes understanding of the findings of todays visit and agrees with plan of treatment. I have discussed any further diagnostic evaluation that may be needed or ordered today. We also reviewed her medications today. she has been encouraged to call the office with any questions or concerns that should arise related to todays visit.    No  orders of the defined types were placed in this encounter.   Meds ordered this encounter  Medications   ondansetron (ZOFRAN) 4 MG tablet    Sig: Take 1 tablet (4 mg total) by mouth every 8 (eight) hours as needed for nausea or vomiting.    Dispense:  30 tablet  Refill:  3    Please fill today asap   meloxicam (MOBIC) 15 MG tablet    Sig: Take 1 tablet (15 mg total) by mouth at bedtime.    Dispense:  90 tablet    Refill:  3    Requesting 1 year supply   omeprazole (PRILOSEC) 40 MG capsule    Sig: Take 1 capsule (40 mg total) by mouth every morning.    Dispense:  90 capsule    Refill:  3   fenofibrate 54 MG tablet    Sig: Take 1 tablet (54 mg total) by mouth daily.    Dispense:  90 tablet    Refill:  3   atorvastatin (LIPITOR) 40 MG tablet    Sig: TAKE 1 TABLET BY MOUTH  DAILY AT 6 PM.    Dispense:  90 tablet    Refill:  3    Requesting 1 year supply   busPIRone (BUSPAR) 5 MG tablet    Sig: Take 1 tablet (5 mg total) by mouth 2 (two) times daily as needed (anxiety).    Dispense:  180 tablet    Refill:  1   clonazePAM (KLONOPIN) 0.5 MG tablet    Sig: TAKE 1 TABLET(0.5 MG) BY MOUTH AT BEDTIME AS NEEDED FOR ANXIETY    Dispense:  20 tablet    Refill:  0   amphetamine-dextroamphetamine (ADDERALL) 5 MG tablet    Sig: Take 1 tablet (5 mg total) by mouth 2 (two) times daily as needed (ADHD symptoms).    Dispense:  60 tablet    Refill:  0    Fill for february   amphetamine-dextroamphetamine (ADDERALL) 5 MG tablet    Sig: Take 1 tablet (5 mg total) by mouth 2 (two) times daily as needed (ADHD symptoms).    Dispense:  60 tablet    Refill:  0    Fill for march   amphetamine-dextroamphetamine (ADDERALL) 5 MG tablet    Sig: Take 1 tablet (5 mg total) by mouth 2 (two) times daily as needed (ADHD symptoms).    Dispense:  60 tablet    Refill:  0    Fill for january    Return in about 12 weeks (around 09/16/2022) for F/U, ADHD med check, [place] PCP.   Total time spent:30  Minutes Time spent includes review of chart, medications, test results, and follow up plan with the patient.   [place] Controlled Substance Database was reviewed by me.  This patient was seen by Jonetta Osgood, FNP-C in collaboration with Dr. Clayborn Bigness as a part of collaborative care agreement.   Ashmi Blas R. Valetta Fuller, MSN, FNP-C Internal medicine

## 2022-06-26 ENCOUNTER — Encounter: Payer: Self-pay | Admitting: Nurse Practitioner

## 2022-07-02 ENCOUNTER — Telehealth: Payer: Self-pay | Admitting: Nurse Practitioner

## 2022-07-02 NOTE — Telephone Encounter (Signed)
Lvm regarding status of dermatology referral. Office stated they have left her vm to schedule appointment, but no return call-[name]

## 2022-08-07 ENCOUNTER — Other Ambulatory Visit: Payer: Self-pay | Admitting: Nurse Practitioner

## 2022-08-07 DIAGNOSIS — K219 Gastro-esophageal reflux disease without esophagitis: Secondary | ICD-10-CM

## 2022-08-07 DIAGNOSIS — Z79899 Other long term (current) drug therapy: Secondary | ICD-10-CM

## 2022-08-22 ENCOUNTER — Telehealth: Payer: Self-pay

## 2022-08-22 NOTE — Telephone Encounter (Signed)
Pt called that [facility] said they don't have adderall pres spoke with [facility] they do have 2 pres on file and left message for pt that she can call [facility] and fill her med

## 2022-08-29 ENCOUNTER — Telehealth: Payer: Self-pay

## 2022-08-29 ENCOUNTER — Other Ambulatory Visit: Payer: Self-pay | Admitting: Physician Assistant

## 2022-08-29 DIAGNOSIS — R41840 Attention and concentration deficit: Secondary | ICD-10-CM

## 2022-08-29 MED ORDER — AMPHETAMINE-DEXTROAMPHETAMINE 5 MG PO TABS: 1.0000 | ORAL_TABLET | Freq: Two times a day (BID) | ORAL | 0 refills | Status: DC | PRN

## 2022-08-29 NOTE — Telephone Encounter (Signed)
Patient called to request Adderall refill after she found it in stock at [facility] on [address]. In [place]. Lauren sent refill since Yetta Flock is out of office, notified patient.

## 2022-09-03 NOTE — Telephone Encounter (Signed)
Done

## 2022-09-09 ENCOUNTER — Ambulatory Visit: Payer: Managed Care, Other (non HMO) | Admitting: Nurse Practitioner

## 2022-09-23 ENCOUNTER — Ambulatory Visit: Payer: Managed Care, Other (non HMO) | Admitting: Nurse Practitioner

## 2022-09-23 ENCOUNTER — Encounter: Payer: Self-pay | Admitting: Nurse Practitioner

## 2022-09-23 VITALS — BP 135/75 | HR 76 | Temp 96.7°F | Resp 16 | Ht 62.0 in | Wt 180.6 lb

## 2022-09-23 DIAGNOSIS — J300 Vasomotor rhinitis: Secondary | ICD-10-CM | POA: Diagnosis not present

## 2022-09-23 DIAGNOSIS — M799 Soft tissue disorder, unspecified: Secondary | ICD-10-CM

## 2022-09-23 DIAGNOSIS — R41840 Attention and concentration deficit: Secondary | ICD-10-CM

## 2022-09-23 DIAGNOSIS — R2241 Localized swelling, mass and lump, right lower limb: Secondary | ICD-10-CM

## 2022-09-23 DIAGNOSIS — F411 Generalized anxiety disorder: Secondary | ICD-10-CM

## 2022-09-23 DIAGNOSIS — M25571 Pain in right ankle and joints of right foot: Secondary | ICD-10-CM

## 2022-09-23 MED ORDER — AMPHETAMINE-DEXTROAMPHETAMINE 5 MG PO TABS: 1.0000 | ORAL_TABLET | Freq: Two times a day (BID) | ORAL | 0 refills | Status: DC | PRN

## 2022-09-23 MED ORDER — FLUTICASONE PROPIONATE 50 MCG/ACT NA SUSP: 2.0000 | Freq: Every day | NASAL | 3 refills | Status: DC

## 2022-09-23 MED ORDER — CLONAZEPAM 0.5 MG PO TABS: ORAL_TABLET | ORAL | 0 refills | Status: DC

## 2022-09-23 NOTE — Progress Notes (Signed)
[facility] [address] [place], [place] [address]  Internal MEDICINE  Office Visit Note  Patient Name: [name]  [id number]  [id number]  Date of Service: 09/23/2022  Chief Complaint  Patient presents with   Hyperlipidemia   Gastroesophageal Reflux   Follow-up    HPI Sophira presents for a follow-up visit for ADHD, recent fall in November and twisted right ankle on last Friday. Fell in November -- and hit left elbow, still has significant bump/lump inferior to the olecranon of the left elbow. It has become larger since November and it hurts with palpation or pressure.  Right foot -- twisted on a cat toy when walking on 4/19. Hurts to bear weight on it. Heard a loud pop when she twisted it. Slight swelling in the lateral side of the ankle and foot.  ADHD -- heart rate and BP are normal. Denies any palpitations or other adverse side effects of her adderall. Due for refills. Current dose remains effective at this time.  Takes clonazepam for anxiety/sleep. Doing well with current dose, due for refills.     Current Medication: Outpatient Encounter Medications as of 09/23/2022  Medication Sig   atorvastatin (LIPITOR) 40 MG tablet TAKE 1 TABLET BY MOUTH  DAILY AT 6 PM.   busPIRone (BUSPAR) 5 MG tablet Take 1 tablet (5 mg total) by mouth 2 (two) times daily as needed (anxiety).   clotrimazole-betamethasone (LOTRISONE) cream Apply 1 Application topically 2 (two) times daily. Until resolved   diphenoxylate-atropine (LOMOTIL) 2.5-0.025 MG tablet Take 1 tablet by mouth 4 (four) times daily as needed for diarrhea or loose stools.   escitalopram (LEXAPRO) 20 MG tablet Take 1 tablet (20 mg total) by mouth daily.   fenofibrate 54 MG tablet TAKE 1 TABLET BY MOUTH DAILY   ibuprofen (ADVIL) 800 MG tablet Take 1 tablet (800 mg total) by mouth 3 (three) times daily.   meloxicam (MOBIC) 15 MG tablet TAKE 1 TABLET BY MOUTH AT  BEDTIME   omeprazole (PRILOSEC) 40 MG capsule TAKE 1  CAPSULE BY MOUTH IN THE  MORNING   ondansetron (ZOFRAN) 4 MG tablet Take 1 tablet (4 mg total) by mouth every 8 (eight) hours as needed for nausea or vomiting.   [DISCONTINUED] amphetamine-dextroamphetamine (ADDERALL) 5 MG tablet Take 1 tablet (5 mg total) by mouth 2 (two) times daily as needed (ADHD symptoms).   [DISCONTINUED] amphetamine-dextroamphetamine (ADDERALL) 5 MG tablet Take 1 tablet (5 mg total) by mouth 2 (two) times daily as needed (ADHD symptoms).   [DISCONTINUED] amphetamine-dextroamphetamine (ADDERALL) 5 MG tablet Take 1 tablet (5 mg total) by mouth 2 (two) times daily as needed (ADHD symptoms).   [DISCONTINUED] clonazePAM (KLONOPIN) 0.5 MG tablet TAKE 1 TABLET(0.5 MG) BY MOUTH AT BEDTIME AS NEEDED FOR ANXIETY   [DISCONTINUED] fluticasone (FLONASE) 50 MCG/ACT nasal spray Place 2 sprays into both nostrils daily.   [START ON 11/18/2022] amphetamine-dextroamphetamine (ADDERALL) 5 MG tablet Take 1 tablet (5 mg total) by mouth 2 (two) times daily as needed (ADHD symptoms).   [START ON 10/21/2022] amphetamine-dextroamphetamine (ADDERALL) 5 MG tablet Take 1 tablet (5 mg total) by mouth 2 (two) times daily as needed (ADHD symptoms).   amphetamine-dextroamphetamine (ADDERALL) 5 MG tablet Take 1 tablet (5 mg total) by mouth 2 (two) times daily as needed (ADHD symptoms).   clonazePAM (KLONOPIN) 0.5 MG tablet TAKE 1 TABLET(0.5 MG) BY MOUTH AT BEDTIME AS NEEDED FOR ANXIETY   fluticasone (FLONASE) 50 MCG/ACT nasal spray Place 2 sprays into both nostrils daily.  Facility-Administered Encounter Medications as of 09/23/2022  Medication   cyanocobalamin ((VITAMIN B-12)) injection 1,000 mcg    Surgical History: Past Surgical History:  Procedure Laterality Date   CARPAL TUNNEL RELEASE Right 11/22/2019   Procedure: CARPAL TUNNEL RELEASE;  Surgeon: Deeann Saint, MD;  Location: [facility] ORS;  Service: Orthopedics;  Laterality: Right;   COLONOSCOPY     TOE SURGERY  2005,2011   ingrown toenails in doctors  office   UPPER GI ENDOSCOPY      Medical History: Past Medical History:  Diagnosis Date   ADHD    Allergy    Anxiety    Colon polyp    Diverticulosis    GERD (gastroesophageal reflux disease)    Hyperlipidemia     Family History: Family History  Problem Relation Age of Onset   Lung cancer Mother    Heart disease Father    Pancreatic cancer Sister     Social History   Socioeconomic History   Marital status: Married    Spouse name: Not on file   Number of children: Not on file   Years of education: Not on file   Highest education level: Not on file  Occupational History   Not on file  Tobacco Use   Smoking status: Never   Smokeless tobacco: Never  Vaping Use   Vaping Use: Never used  Substance and Sexual Activity   Alcohol use: Yes    Comment: ocassionally    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  HENT:  Negative for congestion, rhinorrhea, sneezing and sore throat.   Eyes:  Negative for redness.  Respiratory: Negative.  Negative for cough, chest tightness, shortness of breath and wheezing.   Cardiovascular: Negative.  Negative for chest pain and palpitations.  Gastrointestinal:  Positive for nausea. Negative for abdominal pain, constipation, diarrhea and vomiting.  Genitourinary:  Negative for dysuria and frequency.  Musculoskeletal:  Positive for arthralgias, gait problem (difficulty bearing weight on right foot) and joint swelling (right foot/ankle). Negative for  back pain and neck pain.  Skin:  Negative for rash.  Neurological:  Negative for tremors and numbness.  Psychiatric/Behavioral:  Positive for decreased concentration and sleep disturbance. Negative for behavioral problems (Depression), self-injury and suicidal ideas. The patient is nervous/anxious.     Vital Signs: BP 135/75 Comment: 181/90  Pulse 76   Temp (!) 96.7 F (35.9 C)   Resp 16   Ht  (1.575 m)   Wt 180 lb 9.6 oz (81.9 kg)   SpO2 99%   BMI 33.03 kg/m    Physical Exam Vitals reviewed.  Constitutional:      General: She is not in acute distress.    Appearance: Normal appearance. She is obese. She is not ill-appearing.  HENT:     Head: Normocephalic and atraumatic.  Eyes:     Pupils: Pupils are equal, round, and reactive to light.  Cardiovascular:  Rate and Rhythm: Normal rate and regular rhythm.  Pulmonary:     Effort: Pulmonary effort is normal. No respiratory distress.  Neurological:     Mental Status: She is alert and oriented to person, place, and time.  Psychiatric:        Mood and Affect: Mood normal.        Behavior: Behavior normal.        Assessment/Plan: 1. Acute right ankle pain Foot and ankle xray ordered to rule out fracture or other injury - DG Foot Complete Right; Future - DG Ankle Complete Right; Future  2. Localized swelling of right foot Foot and ankle xray ordered - DG Foot Complete Right; Future - DG Ankle Complete Right; Future  3. Soft tissue lesion of elbow region Xray of elbow ordered for further evaluation - DG Elbow Complete Left; Future  4. Vasomotor rhinitis Continue fluticasone nasal spray as prescribed.  - fluticasone (FLONASE) 50 MCG/ACT nasal spray; Place 2 sprays into both nostrils daily.  Dispense: 48 g; Refill: 3  5. Attention and concentration deficit Continue adderall as prescribed, refills ordered x3 months, follow up in 3 months for additional refills and UDS due then - amphetamine-dextroamphetamine  (ADDERALL) 5 MG tablet; Take 1 tablet (5 mg total) by mouth 2 (two) times daily as needed (ADHD symptoms).  Dispense: 60 tablet; Refill: 0 - amphetamine-dextroamphetamine (ADDERALL) 5 MG tablet; Take 1 tablet (5 mg total) by mouth 2 (two) times daily as needed (ADHD symptoms).  Dispense: 60 tablet; Refill: 0 - amphetamine-dextroamphetamine (ADDERALL) 5 MG tablet; Take 1 tablet (5 mg total) by mouth 2 (two) times daily as needed (ADHD symptoms).  Dispense: 60 tablet; Refill: 0  6. GAD (generalized anxiety disorder) Continue clonazepam as prescribed.  - clonazePAM (KLONOPIN) 0.5 MG tablet; TAKE 1 TABLET(0.5 MG) BY MOUTH AT BEDTIME AS NEEDED FOR ANXIETY  Dispense: 20 tablet; Refill: 0   General Counseling: Maddox verbalizes understanding of the findings of todays visit and agrees with plan of treatment. I have discussed any further diagnostic evaluation that may be needed or ordered today. We also reviewed her medications today. she has been encouraged to call the office with any questions or concerns that should arise related to todays visit.    Orders Placed This Encounter  Procedures   DG Elbow Complete Left   DG Foot Complete Right   DG Ankle Complete Right    Meds ordered this encounter  Medications   amphetamine-dextroamphetamine (ADDERALL) 5 MG tablet    Sig: Take 1 tablet (5 mg total) by mouth 2 (two) times daily as needed (ADHD symptoms).    Dispense:  60 tablet    Refill:  0    Fill for june   amphetamine-dextroamphetamine (ADDERALL) 5 MG tablet    Sig: Take 1 tablet (5 mg total) by mouth 2 (two) times daily as needed (ADHD symptoms).    Dispense:  60 tablet    Refill:  0    Fill for may   amphetamine-dextroamphetamine (ADDERALL) 5 MG tablet    Sig: Take 1 tablet (5 mg total) by mouth 2 (two) times daily as needed (ADHD symptoms).    Dispense:  60 tablet    Refill:  0    Fill for april   clonazePAM (KLONOPIN) 0.5 MG tablet    Sig: TAKE 1 TABLET(0.5 MG) BY MOUTH AT BEDTIME  AS NEEDED FOR ANXIETY    Dispense:  20 tablet    Refill:  0   fluticasone (FLONASE) 50 MCG/ACT nasal  spray    Sig: Place 2 sprays into both nostrils daily.    Dispense:  48 g    Refill:  3    This is for 90 day prescription.    Return in about 12 weeks (around 12/16/2022) for F/U, ADHD med check, Aanvi Voyles PCP UDS due next visit. .   Total time spent:30 Minutes Time spent includes review of chart, medications, test results, and follow up plan with the patient.   [place] Controlled Substance Database was reviewed by me.  This patient was seen by Sallyanne Kuster, FNP-C in collaboration with Dr. Beverely Risen as a part of collaborative care agreement.   Joselinne Lawal R. Tedd Sias, MSN, FNP-C Internal medicine

## 2022-10-17 ENCOUNTER — Telehealth: Payer: Self-pay | Admitting: Nurse Practitioner

## 2022-10-17 NOTE — Telephone Encounter (Signed)
Per [facility], referral has been closed due to patient not replying to calls to schedule-[name]

## 2022-10-27 ENCOUNTER — Other Ambulatory Visit: Payer: Self-pay | Admitting: Internal Medicine

## 2022-10-27 DIAGNOSIS — F411 Generalized anxiety disorder: Secondary | ICD-10-CM

## 2022-10-29 NOTE — Telephone Encounter (Signed)
Last 4/24 and next 7/24

## 2022-11-01 ENCOUNTER — Ambulatory Visit
Admission: RE | Admit: 2022-11-01 | Discharge: 2022-11-01 | Disposition: A | Discharge status: Home / Self Care | Payer: Managed Care, Other (non HMO) | Source: Ambulatory Visit | Attending: Nurse Practitioner | Admitting: Nurse Practitioner

## 2022-11-01 ENCOUNTER — Ambulatory Visit
Admission: RE | Admit: 2022-11-01 | Discharge: 2022-11-01 | Disposition: A | Discharge status: Home / Self Care | Payer: Managed Care, Other (non HMO) | Attending: Nurse Practitioner | Admitting: Nurse Practitioner

## 2022-11-01 DIAGNOSIS — M25571 Pain in right ankle and joints of right foot: Secondary | ICD-10-CM | POA: Insufficient documentation

## 2022-11-01 DIAGNOSIS — R2241 Localized swelling, mass and lump, right lower limb: Secondary | ICD-10-CM | POA: Insufficient documentation

## 2022-11-01 DIAGNOSIS — M799 Soft tissue disorder, unspecified: Secondary | ICD-10-CM | POA: Insufficient documentation

## 2022-11-05 ENCOUNTER — Telehealth: Payer: Self-pay

## 2022-11-05 ENCOUNTER — Other Ambulatory Visit: Payer: Self-pay | Admitting: Nurse Practitioner

## 2022-11-05 DIAGNOSIS — G4733 Obstructive sleep apnea (adult) (pediatric): Secondary | ICD-10-CM

## 2022-11-05 DIAGNOSIS — R5383 Other fatigue: Secondary | ICD-10-CM

## 2022-11-05 DIAGNOSIS — K219 Gastro-esophageal reflux disease without esophagitis: Secondary | ICD-10-CM

## 2022-11-05 DIAGNOSIS — E782 Mixed hyperlipidemia: Secondary | ICD-10-CM

## 2022-11-05 DIAGNOSIS — E538 Deficiency of other specified B group vitamins: Secondary | ICD-10-CM

## 2022-11-05 NOTE — Telephone Encounter (Signed)
Patient called requesting labs be re-ordered, she apologizes for not getting them done in the past, she wants them done to be able to discuss results during upcoming appt on 12/16/22. Alyssa ordered labs, left message for patient to notify her.

## 2022-11-11 ENCOUNTER — Ambulatory Visit (INDEPENDENT_AMBULATORY_CARE_PROVIDER_SITE_OTHER): Payer: Managed Care, Other (non HMO) | Admitting: Internal Medicine

## 2022-11-11 VITALS — BP 133/82 | HR 67 | Resp 14 | Ht 62.0 in | Wt 179.0 lb

## 2022-11-11 DIAGNOSIS — G4733 Obstructive sleep apnea (adult) (pediatric): Secondary | ICD-10-CM | POA: Diagnosis not present

## 2022-11-11 DIAGNOSIS — Z7189 Other specified counseling: Secondary | ICD-10-CM

## 2022-11-11 NOTE — Patient Instructions (Signed)

## 2022-11-11 NOTE — Progress Notes (Unsigned)
[facility] [address] [place], [place] [address]  Pulmonary Sleep Medicine   Office Visit Note  Patient Name: [name] DOB: 10-Oct-1955 MRN [id number]    Chief Complaint: Obstructive Sleep Apnea visit  Brief History:  Samaura is seen today for an annual follow up on APAP at 4-20 cmh20.  The patient has a 2 year history of sleep apnea. Patient is using PAP nightly.  The patient feels rested after sleeping with PAP.  The patient reports benefiting from PAP use. Reported sleepiness is  improved and the Epworth Sleepiness Score is 3 out of 24. The patient does not take naps. The patient complains of the following: No complaints.  The compliance download shows 97% compliance with an average use time of 7:21 hours. The AHI is 1.9.  The patient *** of limb movements disrupting sleep.  ROS  General: (-) fever, (-) chills, (-) night sweat Nose and Sinuses: (-) nasal stuffiness or itchiness, (-) postnasal drip, (-) nosebleeds, (-) sinus trouble. Mouth and Throat: (-) sore throat, (-) hoarseness. Neck: (-) swollen glands, (-) enlarged thyroid, (-) neck pain. Respiratory: *** cough, *** shortness of breath, *** wheezing. Neurologic: *** numbness, *** tingling. Psychiatric: *** anxiety, *** depression   Current Medication: Outpatient Encounter Medications as of 11/11/2022  Medication Sig   [START ON 11/18/2022] amphetamine-dextroamphetamine (ADDERALL) 5 MG tablet Take 1 tablet (5 mg total) by mouth 2 (two) times daily as needed (ADHD symptoms).   amphetamine-dextroamphetamine (ADDERALL) 5 MG tablet Take 1 tablet (5 mg total) by mouth 2 (two) times daily as needed (ADHD symptoms).   amphetamine-dextroamphetamine (ADDERALL) 5 MG tablet Take 1 tablet (5 mg total) by mouth 2 (two) times daily as needed (ADHD symptoms).   atorvastatin (LIPITOR) 40 MG tablet TAKE 1 TABLET BY MOUTH  DAILY AT 6 PM.   busPIRone (BUSPAR) 5 MG tablet Take 1 tablet (5 mg total) by mouth 2 (two) times  daily as needed (anxiety).   clonazePAM (KLONOPIN) 0.5 MG tablet TAKE 1 TABLET(0.5 MG) BY MOUTH AT BEDTIME AS NEEDED FOR ANXIETY   clotrimazole-betamethasone (LOTRISONE) cream Apply 1 Application topically 2 (two) times daily. Until resolved   diphenoxylate-atropine (LOMOTIL) 2.5-0.025 MG tablet Take 1 tablet by mouth 4 (four) times daily as needed for diarrhea or loose stools.   escitalopram (LEXAPRO) 20 MG tablet Take 1 tablet (20 mg total) by mouth daily.   fenofibrate 54 MG tablet TAKE 1 TABLET BY MOUTH DAILY   fluticasone (FLONASE) 50 MCG/ACT nasal spray Place 2 sprays into both nostrils daily.   ibuprofen (ADVIL) 800 MG tablet Take 1 tablet (800 mg total) by mouth 3 (three) times daily.   meloxicam (MOBIC) 15 MG tablet TAKE 1 TABLET BY MOUTH AT  BEDTIME   omeprazole (PRILOSEC) 40 MG capsule TAKE 1 CAPSULE BY MOUTH IN THE  MORNING   ondansetron (ZOFRAN) 4 MG tablet Take 1 tablet (4 mg total) by mouth every 8 (eight) hours as needed for nausea or vomiting.   Facility-Administered Encounter Medications as of 11/11/2022  Medication   cyanocobalamin ((VITAMIN B-12)) injection 1,000 mcg    Surgical History: Past Surgical History:  Procedure Laterality Date   CARPAL TUNNEL RELEASE Right 11/22/2019   Procedure: CARPAL TUNNEL RELEASE;  Surgeon: Deeann Saint, MD;  Location: [facility] ORS;  Service: Orthopedics;  Laterality: Right;   COLONOSCOPY     TOE SURGERY  2005,2011   ingrown toenails in doctors office   UPPER GI ENDOSCOPY      Medical History: Past Medical  History:  Diagnosis Date   ADHD    Allergy    Anxiety    Colon polyp    Diverticulosis    GERD (gastroesophageal reflux disease)    Hyperlipidemia     Family History: Non contributory to the present illness  Social History: Social History   Socioeconomic History   Marital status: Married    Spouse name: Not on file   Number of children: Not on file   Years of education: Not on file   Highest education level: Not on  file  Occupational History   Not on file  Tobacco Use   Smoking status: Never   Smokeless tobacco: Never  Vaping Use   Vaping Use: Never used  Substance and Sexual Activity   Alcohol use: Yes    Comment: ocassionally    Drug use: Yes    Types: Marijuana    Comment: daily use   Sexual activity: Not on file  Other Topics Concern   Not on file  Social History Narrative   Not on file   Social Determinants of Health   Financial Resource Strain: Not on file  Food Insecurity: Not on file  Transportation Needs: Not on file  Physical Activity: Not on file  Stress: Not on file  Social Connections: Not on file  Intimate Partner Violence: Not on file    Vital Signs: There were no vitals taken for this visit. There is no height or weight on file to calculate BMI.    Examination: General Appearance: The patient is well-developed, well-nourished, and in no distress. Neck Circumference: 40 cm Skin: Gross inspection of skin unremarkable. Head: normocephalic, no gross deformities. Eyes: no gross deformities noted. ENT: ears appear grossly normal Neurologic: Alert and oriented. No involuntary movements.  STOP BANG RISK ASSESSMENT S (snore) Have you been told that you snore?     NO   T (tired) Are you often tired, fatigued, or sleepy during the day?   NO  O (obstruction) Do you stop breathing, choke, or gasp during sleep? NO   P (pressure) Do you have or are you being treated for high blood pressure? NO   B (BMI) Is your body index greater than 35 kg/m? NO   A (age) Are you 29 years old or older? YES   N (neck) Do you have a neck circumference greater than 16 inches?   YES   G (gender) Are you a female? NO   TOTAL STOP/BANG "YES" ANSWERS 2       A STOP-Bang score of 2 or less is considered low risk, and a score of 5 or more is high risk for having either moderate or severe OSA. For people who score 3 or 4, doctors may need to perform further assessment to determine how  likely they are to have OSA.         EPWORTH SLEEPINESS SCALE:  Scale:  (0)= no chance of dozing; (1)= slight chance of dozing; (2)= moderate chance of dozing; (3)= high chance of dozing  Chance  Situtation    Sitting and reading: 1    Watching TV: 1    Sitting Inactive in public: 0    As a passenger in car: 0      Lying down to rest: 1    Sitting and talking: 0    Sitting quielty after lunch: 0    In a car, stopped in traffic: 0   TOTAL SCORE:   3 out of 24  SLEEP STUDIES:  PSG (06/27/20) AHI 20.9, REM AHI 67.6, min SPO2 79%   CPAP COMPLIANCE DATA:  Date Range: 11/07/21 - 11/06/22  Average Daily Use: 7:21 hours  Median Use: 7:28 hours  Compliance for > 4 Hours: 354 days  AHI: 1.9 respiratory events per hour  Days Used: 363/365  Mask Leak: 23.1  95th Percentile Pressure: 12 cmh20         LABS: No results found for this or any previous visit (from the past 2160 hour(s)).  Radiology: DG Foot Complete Right  Result Date: 11/07/2022 CLINICAL DATA:  Right foot and ankle pain EXAM: RIGHT ANKLE - COMPLETE 3+ VIEW; RIGHT FOOT COMPLETE - 3+ VIEW COMPARISON:  Radiographs 10/12/2010 FINDINGS: No acute fracture or dislocation. Calcaneal spurs. Soft tissues are unremarkable. IMPRESSION: No acute fracture or dislocation. Electronically Signed   By: Minerva Fester M.D.   On: 11/07/2022 04:02   DG Ankle Complete Right  Result Date: 11/07/2022 CLINICAL DATA:  Right foot and ankle pain EXAM: RIGHT ANKLE - COMPLETE 3+ VIEW; RIGHT FOOT COMPLETE - 3+ VIEW COMPARISON:  Radiographs 10/12/2010 FINDINGS: No acute fracture or dislocation. Calcaneal spurs. Soft tissues are unremarkable. IMPRESSION: No acute fracture or dislocation. Electronically Signed   By: Minerva Fester M.D.   On: 11/07/2022 04:02   DG Elbow Complete Left  Result Date: 11/07/2022 CLINICAL DATA:  History of fall and injury to left elbow 6 months ago. Posterior elbow pain. EXAM: LEFT ELBOW - COMPLETE  3+ VIEW COMPARISON:  None Available. FINDINGS: There is no evidence of fracture, dislocation, or joint effusion. There is no evidence of arthropathy or other focal bone abnormality. Soft tissues are unremarkable. IMPRESSION: Negative. Electronically Signed   By: Minerva Fester M.D.   On: 11/07/2022 03:59    No results found.  DG Foot Complete Right  Result Date: 11/07/2022 CLINICAL DATA:  Right foot and ankle pain EXAM: RIGHT ANKLE - COMPLETE 3+ VIEW; RIGHT FOOT COMPLETE - 3+ VIEW COMPARISON:  Radiographs 10/12/2010 FINDINGS: No acute fracture or dislocation. Calcaneal spurs. Soft tissues are unremarkable. IMPRESSION: No acute fracture or dislocation. Electronically Signed   By: Minerva Fester M.D.   On: 11/07/2022 04:02   DG Ankle Complete Right  Result Date: 11/07/2022 CLINICAL DATA:  Right foot and ankle pain EXAM: RIGHT ANKLE - COMPLETE 3+ VIEW; RIGHT FOOT COMPLETE - 3+ VIEW COMPARISON:  Radiographs 10/12/2010 FINDINGS: No acute fracture or dislocation. Calcaneal spurs. Soft tissues are unremarkable. IMPRESSION: No acute fracture or dislocation. Electronically Signed   By: Minerva Fester M.D.   On: 11/07/2022 04:02   DG Elbow Complete Left  Result Date: 11/07/2022 CLINICAL DATA:  History of fall and injury to left elbow 6 months ago. Posterior elbow pain. EXAM: LEFT ELBOW - COMPLETE 3+ VIEW COMPARISON:  None Available. FINDINGS: There is no evidence of fracture, dislocation, or joint effusion. There is no evidence of arthropathy or other focal bone abnormality. Soft tissues are unremarkable. IMPRESSION: Negative. Electronically Signed   By: Minerva Fester M.D.   On: 11/07/2022 03:59      Assessment and Plan: Patient Active Problem List   Diagnosis Date Noted   OSA on CPAP 11/20/2020   CPAP use counseling 11/20/2020   Overweight (BMI 25.0-29.9) 11/20/2020   Excessive daytime sleepiness 06/21/2020   Loud snoring 06/21/2020   BMI 32.0-32.9,adult 03/24/2020   Needs flu shot 03/24/2020    Vasomotor rhinitis 08/22/2019   Encounter for general adult medical examination with abnormal findings 05/16/2019   Encounter for  long-term (current) use of medications 04/17/2019   Screening for breast cancer 02/09/2018   Routine cervical smear 02/09/2018   Gastroesophageal reflux disease without esophagitis 11/12/2017   Urinary tract infection with hematuria 07/30/2017   Acute cystitis with hematuria 07/30/2017   Low back pain at multiple sites 07/30/2017   Dysuria 07/30/2017   Mixed hyperlipidemia 07/30/2017   Attention and concentration deficit 07/30/2017   GAD (generalized anxiety disorder) 07/30/2017   Other fatigue 07/30/2017   1. OSA on CPAP The patient does tolerate PAP and reports  benefit from PAP use. The patient was reminded how to clean equipment and advised to replace supplies routinely. The patient was also counselled on weight loss. The compliance is excellent. The AHI is 1,9.   OSA on cpap- controlled. Continue with excellent compliance with pap. CPAP continues to be medically necessary to treat this patient's OSA. F/u one year.     2. CPAP use counseling CPAP Counseling: had a lengthy discussion with the patient regarding the importance of PAP therapy in management of the sleep apnea. Patient appears to understand the risk factor reduction and also understands the risks associated with untreated sleep apnea. Patient will try to make a good faith effort to remain compliant with therapy. Also instructed the patient on proper cleaning of the device including the water must be changed daily if possible and use of distilled water is preferred. Patient understands that the machine should be regularly cleaned with appropriate recommended cleaning solutions that do not damage the PAP machine for example given white vinegar and water rinses. Other methods such as ozone treatment may not be as good as these simple methods to achieve cleaning.      General Counseling: I have  discussed the findings of the evaluation and examination with Tikita.  I have also discussed any further diagnostic evaluation thatmay be needed or ordered today. Danyell verbalizes understanding of the findings of todays visit. We also reviewed her medications today and discussed drug interactions and side effects including but not limited excessive drowsiness and altered mental states. We also discussed that there is always a risk not just to her but also people around her. she has been encouraged to call the office with any questions or concerns that should arise related to todays visit.  No orders of the defined types were placed in this encounter.       I have personally obtained a history, examined the patient, evaluated laboratory and imaging results, formulated the assessment and plan and placed orders. This patient was seen today by Emmaline Kluver, PA-C in collaboration with Dr. Freda Munro.   Yevonne Pax, MD [facility] Diplomate ABMS Pulmonary Critical Care Medicine and Sleep Medicine

## 2022-11-18 ENCOUNTER — Other Ambulatory Visit: Payer: Self-pay | Admitting: Nurse Practitioner

## 2022-11-18 DIAGNOSIS — F411 Generalized anxiety disorder: Secondary | ICD-10-CM

## 2022-11-21 LAB — CBC WITH DIFFERENTIAL/PLATELET
Basophils Absolute: 0 10*3/uL (ref 0.0–0.2)
Basos: 1 %
EOS (ABSOLUTE): 0.2 10*3/uL (ref 0.0–0.4)
Eos: 3 %
Hematocrit: 37.1 % (ref 34.0–46.6)
Hemoglobin: 12.4 g/dL (ref 11.1–15.9)
Immature Grans (Abs): 0.1 10*3/uL (ref 0.0–0.1)
Immature Granulocytes: 1 %
Lymphocytes Absolute: 2.3 10*3/uL (ref 0.7–3.1)
Lymphs: 35 %
MCH: 31.2 pg (ref 26.6–33.0)
MCHC: 33.4 g/dL (ref 31.5–35.7)
MCV: 93 fL (ref 79–97)
Monocytes Absolute: 0.6 10*3/uL (ref 0.1–0.9)
Monocytes: 8 %
Neutrophils Absolute: 3.5 10*3/uL (ref 1.4–7.0)
Neutrophils: 52 %
Platelets: 285 10*3/uL (ref 150–450)
RBC: 3.98 x10E6/uL (ref 3.77–5.28)
RDW: 12.9 % (ref 11.7–15.4)
WBC: 6.7 10*3/uL (ref 3.4–10.8)

## 2022-11-21 LAB — LIPID PANEL
Chol/HDL Ratio: 3.6 ratio (ref 0.0–4.4)
Cholesterol, Total: 181 mg/dL (ref 100–199)
HDL: 50 mg/dL (ref >39)
LDL Chol Calc (NIH): 99 mg/dL (ref 0–99)
Triglycerides: 185 mg/dL — ABNORMAL HIGH (ref 0–149)
VLDL Cholesterol Cal: 32 mg/dL (ref 5–40)

## 2022-11-21 LAB — TSH+FREE T4
Free T4: 1.04 ng/dL (ref 0.82–1.77)
TSH: 0.996 u[IU]/mL (ref 0.450–4.500)

## 2022-11-21 LAB — CMP14+EGFR
ALT: 17 IU/L (ref 0–32)
AST: 20 IU/L (ref 0–40)
Albumin: 4.6 g/dL (ref 3.9–4.9)
Alkaline Phosphatase: 69 IU/L (ref 44–121)
BUN/Creatinine Ratio: 14 (ref 12–28)
BUN: 12 mg/dL (ref 8–27)
Bilirubin Total: 0.4 mg/dL (ref 0.0–1.2)
CO2: 26 mmol/L (ref 20–29)
Calcium: 9.7 mg/dL (ref 8.7–10.3)
Chloride: 102 mmol/L (ref 96–106)
Creatinine, Ser: 0.88 mg/dL (ref 0.57–1.00)
Globulin, Total: 2.2 g/dL (ref 1.5–4.5)
Glucose: 74 mg/dL (ref 70–99)
Potassium: 4.7 mmol/L (ref 3.5–5.2)
Sodium: 142 mmol/L (ref 134–144)
Total Protein: 6.8 g/dL (ref 6.0–8.5)
eGFR: 72 mL/min/{1.73_m2} (ref >59)

## 2022-11-21 LAB — B12 AND FOLATE PANEL
Folate: 16 ng/mL (ref >3.0)
Vitamin B-12: 431 pg/mL (ref 232–1245)

## 2022-11-22 ENCOUNTER — Telehealth: Payer: Self-pay

## 2022-11-22 NOTE — Telephone Encounter (Signed)
-----   Message from Sallyanne Kuster, NP sent at 11/22/2022  1:25 PM EDT ----- Elbow xray is normal, no fracture or joint abnormality

## 2022-11-22 NOTE — Progress Notes (Signed)
No fracture, dislocation or other abnormality noted, but there are small bone spurs on the calcaneus.

## 2022-11-22 NOTE — Progress Notes (Signed)
Elbow xray is normal, no fracture or joint abnormality

## 2022-11-22 NOTE — Progress Notes (Signed)
Foot xray is normal

## 2022-11-22 NOTE — Telephone Encounter (Signed)
Left message for patient and sent MyChart message regarding normal elbow x-ray.

## 2022-11-27 ENCOUNTER — Telehealth: Payer: Self-pay

## 2022-11-27 NOTE — Telephone Encounter (Signed)
Lmom and sent MY chart message

## 2022-11-27 NOTE — Telephone Encounter (Signed)
-----   Message from Sallyanne Kuster, NP sent at 11/27/2022  8:38 AM EDT ----- All labs are normal except elevated triglycerides.  Some ways to decrease triglycerides include limiting red meat intake, increasing lean proteins in diet and trying a fish oil or flaxseed oil supplement. We can talk further about it if needed at her upcoming appointment in July.

## 2022-11-27 NOTE — Progress Notes (Signed)
All labs are normal except elevated triglycerides.  Some ways to decrease triglycerides include limiting red meat intake, increasing lean proteins in diet and trying a fish oil or flaxseed oil supplement. We can talk further about it if needed at her upcoming appointment in July.

## 2022-12-16 ENCOUNTER — Encounter: Payer: Self-pay | Admitting: Nurse Practitioner

## 2022-12-16 ENCOUNTER — Ambulatory Visit (INDEPENDENT_AMBULATORY_CARE_PROVIDER_SITE_OTHER): Payer: Managed Care, Other (non HMO) | Admitting: Nurse Practitioner

## 2022-12-16 VITALS — BP 130/78 | HR 66 | Temp 98.1°F | Resp 16 | Ht 62.0 in | Wt 182.2 lb

## 2022-12-16 DIAGNOSIS — Z79899 Other long term (current) drug therapy: Secondary | ICD-10-CM

## 2022-12-16 DIAGNOSIS — M545 Low back pain, unspecified: Secondary | ICD-10-CM | POA: Diagnosis not present

## 2022-12-16 DIAGNOSIS — F411 Generalized anxiety disorder: Secondary | ICD-10-CM

## 2022-12-16 DIAGNOSIS — R41840 Attention and concentration deficit: Secondary | ICD-10-CM

## 2022-12-16 DIAGNOSIS — G8929 Other chronic pain: Secondary | ICD-10-CM

## 2022-12-16 LAB — POCT URINE DRUG SCREEN
Methylenedioxyamphetamine: NOT DETECTED
POC Amphetamine UR: NOT DETECTED
POC BENZODIAZEPINES UR: NOT DETECTED
POC Barbiturate UR: NOT DETECTED
POC Cocaine UR: NOT DETECTED
POC Ecstasy UR: NOT DETECTED
POC Marijuana UR: NOT DETECTED
POC Methadone UR: NOT DETECTED
POC Methamphetamine UR: NOT DETECTED
POC Opiate Ur: NOT DETECTED
POC Oxycodone UR: NOT DETECTED
POC PHENCYCLIDINE UR: NOT DETECTED
POC TRICYCLICS UR: NOT DETECTED

## 2022-12-16 MED ORDER — CLONAZEPAM 0.5 MG PO TABS: ORAL_TABLET | ORAL | 1 refills | Status: DC

## 2022-12-16 MED ORDER — AMPHETAMINE-DEXTROAMPHETAMINE 5 MG PO TABS
1.0000 | ORAL_TABLET | Freq: Two times a day (BID) | ORAL | 0 refills | Status: DC | PRN
Start: 2022-12-16 — End: 2023-03-11

## 2022-12-16 MED ORDER — AMPHETAMINE-DEXTROAMPHETAMINE 5 MG PO TABS
1.0000 | ORAL_TABLET | Freq: Two times a day (BID) | ORAL | 0 refills | Status: DC | PRN
Start: 2023-02-10 — End: 2023-03-11

## 2022-12-16 MED ORDER — AMPHETAMINE-DEXTROAMPHETAMINE 5 MG PO TABS
1.0000 | ORAL_TABLET | Freq: Two times a day (BID) | ORAL | 0 refills | Status: DC | PRN
Start: 2023-01-13 — End: 2023-03-11

## 2022-12-16 MED ORDER — CELECOXIB 100 MG PO CAPS
100.0000 mg | ORAL_CAPSULE | Freq: Two times a day (BID) | ORAL | 1 refills | Status: DC
Start: 2022-12-16 — End: 2023-06-18

## 2022-12-16 NOTE — Progress Notes (Signed)
[facility] [address] [place], [place] [address]  Internal MEDICINE  Office Visit Note  Patient Name: [name]  [id number]  [id number]  Date of Service: 12/16/2022  Chief Complaint  Patient presents with   Gastroesophageal Reflux   Hyperlipidemia   Follow-up    HPI Chrishawn presents for a follow-up visit for joint pains, ADHD and anxiety/sleep Joint pains/arthritis and back pains -- has been on meloxicam for a long time. Worried statin is making joint pains worse. Wants to try something else.  ADHD -- heart rate and BP are normal. Current dose remains effective. Denies any palpitations or other adverse side effects of the medication. Due for refills. Due for UDS, UDS was negative . Anxiety/sleep  -- due for refills of clonazepam, current dose remains effective.     Current Medication: Outpatient Encounter Medications as of 12/16/2022  Medication Sig   atorvastatin (LIPITOR) 40 MG tablet TAKE 1 TABLET BY MOUTH  DAILY AT 6 PM.   busPIRone (BUSPAR) 5 MG tablet Take 1 tablet (5 mg total) by mouth 2 (two) times daily as needed (anxiety).   celecoxib (CELEBREX) 100 MG capsule Take 1 capsule (100 mg total) by mouth 2 (two) times daily.   clotrimazole-betamethasone (LOTRISONE) cream Apply 1 Application topically 2 (two) times daily. Until resolved   diphenoxylate-atropine (LOMOTIL) 2.5-0.025 MG tablet Take 1 tablet by mouth 4 (four) times daily as needed for diarrhea or loose stools.   escitalopram (LEXAPRO) 20 MG tablet TAKE 1 TABLET BY MOUTH DAILY   fenofibrate 54 MG tablet TAKE 1 TABLET BY MOUTH DAILY   fluticasone (FLONASE) 50 MCG/ACT nasal spray Place 2 sprays into both nostrils daily.   ibuprofen (ADVIL) 800 MG tablet Take 1 tablet (800 mg total) by mouth 3 (three) times daily.   omeprazole (PRILOSEC) 40 MG capsule TAKE 1 CAPSULE BY MOUTH IN THE  MORNING   ondansetron (ZOFRAN) 4 MG tablet Take 1 tablet (4 mg total) by mouth every 8 (eight) hours as needed for  nausea or vomiting.   [DISCONTINUED] amphetamine-dextroamphetamine (ADDERALL) 5 MG tablet Take 1 tablet (5 mg total) by mouth 2 (two) times daily as needed (ADHD symptoms).   [DISCONTINUED] clonazePAM (KLONOPIN) 0.5 MG tablet TAKE 1 TABLET(0.5 MG) BY MOUTH AT BEDTIME AS NEEDED FOR ANXIETY   [DISCONTINUED] meloxicam (MOBIC) 15 MG tablet TAKE 1 TABLET BY MOUTH AT  BEDTIME   amphetamine-dextroamphetamine (ADDERALL) 5 MG tablet Take 1 tablet (5 mg total) by mouth 2 (two) times daily as needed (ADHD symptoms).   [START ON 01/13/2023] amphetamine-dextroamphetamine (ADDERALL) 5 MG tablet Take 1 tablet (5 mg total) by mouth 2 (two) times daily as needed (ADHD symptoms).   [START ON 02/10/2023] amphetamine-dextroamphetamine (ADDERALL) 5 MG tablet Take 1 tablet (5 mg total) by mouth 2 (two) times daily as needed (ADHD symptoms).   clonazePAM (KLONOPIN) 0.5 MG tablet TAKE 1 TABLET(0.5 MG) BY MOUTH AT BEDTIME AS NEEDED FOR ANXIETY   Facility-Administered Encounter Medications as of 12/16/2022  Medication   cyanocobalamin ((VITAMIN B-12)) injection 1,000 mcg    Surgical History: Past Surgical History:  Procedure Laterality Date   CARPAL TUNNEL RELEASE Right 11/22/2019   Procedure: CARPAL TUNNEL RELEASE;  Surgeon: Deeann Saint, MD;  Location: [facility] ORS;  Service: Orthopedics;  Laterality: Right;   COLONOSCOPY     TOE SURGERY  2005,2011   ingrown toenails in doctors office   UPPER GI ENDOSCOPY      Medical History: Past Medical History:  Diagnosis Date   ADHD  Allergy    Anxiety    Colon polyp    Diverticulosis    GERD (gastroesophageal reflux disease)    Hyperlipidemia     Family History: Family History  Problem Relation Age of Onset   Lung cancer Mother    Heart disease Father    Pancreatic cancer Sister     Social History   Socioeconomic History   Marital status: Married    Spouse name: Not on file   Number of children: Not on file   Years of education: Not on file   Highest  education level: Not on file  Occupational History   Not on file  Tobacco Use   Smoking status: Never   Smokeless tobacco: Never  Vaping Use   Vaping status: Never Used  Substance and Sexual Activity   Alcohol use: Yes    Comment: ocassionally    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  HENT:  Negative for congestion, rhinorrhea, sneezing and sore throat.   Eyes:  Negative for redness.  Respiratory: Negative.  Negative for cough, chest tightness, shortness of breath and wheezing.   Cardiovascular: Negative.  Negative for chest pain and palpitations.  Gastrointestinal:  Positive for nausea. Negative for abdominal pain, constipation, diarrhea and vomiting.  Genitourinary:  Negative for dysuria and frequency.  Musculoskeletal:  Negative for arthralgias, back pain, joint swelling and neck pain.  Skin:  Negative for rash.  Neurological: Negative.  Negative for tremors and numbness.  Hematological:  Negative for adenopathy. Does not bruise/bleed easily.  Psychiatric/Behavioral:  Negative for behavioral problems (Depression), sleep disturbance and suicidal ideas. The patient is not nervous/anxious.     Vital Signs: BP 130/78   Pulse 66   Temp 98.1 F (36.7 C)   Resp 16   Ht 5\' 2"  (1.575 m)   Wt 182 lb 3.2 oz (82.6 kg)   SpO2 98%   BMI 33.32 kg/m    Physical Exam Vitals reviewed.  Constitutional:      General: She is not in acute distress.    Appearance: Normal appearance. She is  not ill-appearing.  HENT:     Head: Normocephalic and atraumatic.  Eyes:     Pupils: Pupils are equal, round, and reactive to light.  Cardiovascular:     Rate and Rhythm: Normal rate and regular rhythm.  Pulmonary:     Effort: Pulmonary effort is normal. No respiratory distress.  Neurological:     Mental Status: She is alert and oriented to person, place, and time.  Psychiatric:        Mood and Affect: Mood normal.        Behavior: Behavior normal.        Assessment/Plan: 1. Chronic bilateral low back pain without sciatica Discontinue meloxicam. Start celebrex as prescribed.  - celecoxib (CELEBREX) 100 MG capsule; Take 1 capsule (100 mg total) by mouth 2 (two) times daily.  Dispense: 180  capsule; Refill: 1  2. Attention and concentration deficit Continue adderall as prescribed. Follow up in 3 months for additional refills.  - amphetamine-dextroamphetamine (ADDERALL) 5 MG tablet; Take 1 tablet (5 mg total) by mouth 2 (two) times daily as needed (ADHD symptoms).  Dispense: 60 tablet; Refill: 0 - amphetamine-dextroamphetamine (ADDERALL) 5 MG tablet; Take 1 tablet (5 mg total) by mouth 2 (two) times daily as needed (ADHD symptoms).  Dispense: 60 tablet; Refill: 0 - amphetamine-dextroamphetamine (ADDERALL) 5 MG tablet; Take 1 tablet (5 mg total) by mouth 2 (two) times daily as needed (ADHD symptoms).  Dispense: 60 tablet; Refill: 0  3. Encounter for long-term (current) use of medications UDS was negative.  - POCT Urine Drug Screen  4. GAD (generalized anxiety disorder) Continue clonazepam prn as prescribed.  - clonazePAM (KLONOPIN) 0.5 MG tablet; TAKE 1 TABLET(0.5 MG) BY MOUTH AT BEDTIME AS NEEDED FOR ANXIETY  Dispense: 20 tablet; Refill: 1   General Counseling: Dot verbalizes understanding of the findings of todays visit and agrees with plan of treatment. I have discussed any further diagnostic evaluation that may be needed or ordered today. We also reviewed her medications  today. she has been encouraged to call the office with any questions or concerns that should arise related to todays visit.    Orders Placed This Encounter  Procedures   POCT Urine Drug Screen    Meds ordered this encounter  Medications   amphetamine-dextroamphetamine (ADDERALL) 5 MG tablet    Sig: Take 1 tablet (5 mg total) by mouth 2 (two) times daily as needed (ADHD symptoms).    Dispense:  60 tablet    Refill:  0    Fill for July   amphetamine-dextroamphetamine (ADDERALL) 5 MG tablet    Sig: Take 1 tablet (5 mg total) by mouth 2 (two) times daily as needed (ADHD symptoms).    Dispense:  60 tablet    Refill:  0    Fill for August   amphetamine-dextroamphetamine (ADDERALL) 5 MG tablet    Sig: Take 1 tablet (5 mg total) by mouth 2 (two) times daily as needed (ADHD symptoms).    Dispense:  60 tablet    Refill:  0    Fill for september   clonazePAM (KLONOPIN) 0.5 MG tablet    Sig: TAKE 1 TABLET(0.5 MG) BY MOUTH AT BEDTIME AS NEEDED FOR ANXIETY    Dispense:  20 tablet    Refill:  1   celecoxib (CELEBREX) 100 MG capsule    Sig: Take 1 capsule (100 mg total) by mouth 2 (two) times daily.    Dispense:  180 capsule    Refill:  1    Return in about 3 months (around 03/11/2023) for F/U, ADHD med check, Marjorie Lussier PCP.   Total time spent:30 Minutes Time spent includes review of chart, medications, test results, and follow up plan with the patient.   [place] Controlled Substance Database was reviewed by me.  This patient was seen by Sallyanne Kuster, FNP-C in collaboration with Dr. Beverely Risen as a part of collaborative care agreement.   Milam Allbaugh R. Tedd Sias, MSN, FNP-C Internal medicine

## 2023-01-02 ENCOUNTER — Other Ambulatory Visit: Payer: Managed Care, Other (non HMO)

## 2023-01-10 ENCOUNTER — Other Ambulatory Visit: Payer: Self-pay

## 2023-01-10 DIAGNOSIS — F411 Generalized anxiety disorder: Secondary | ICD-10-CM

## 2023-01-10 MED ORDER — ESCITALOPRAM OXALATE 20 MG PO TABS
20.0000 mg | ORAL_TABLET | Freq: Every day | ORAL | 0 refills | Status: DC
Start: 2023-01-10 — End: 2023-04-15

## 2023-01-18 IMAGING — MG MM DIGITAL SCREENING BILAT W/ TOMO AND CAD
8 series · 8 of 24 positions shown · non-contrast
Comparison: Previous exam(s).

CLINICAL DATA: Screening.

EXAM:
DIGITAL SCREENING BILATERAL MAMMOGRAM WITH TOMOSYNTHESIS AND CAD
TECHNIQUE: Bilateral screening digital craniocaudal and mediolateral oblique
mammograms were obtained. Bilateral screening digital breast
tomosynthesis was performed. The images were evaluated with
computer-aided detection.

[R CC synth-2D]
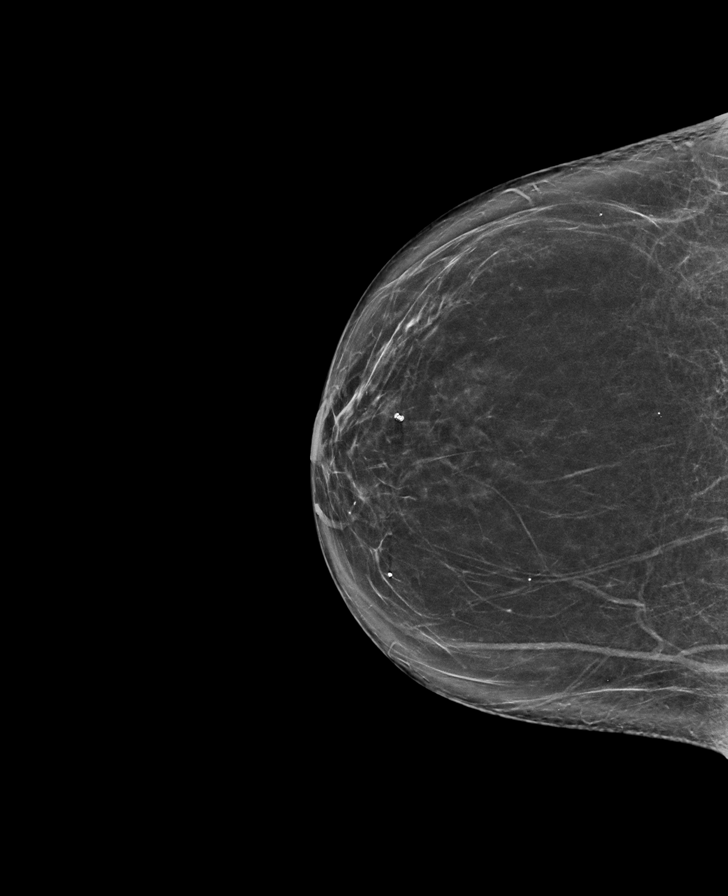

[L MLO synth-2D]
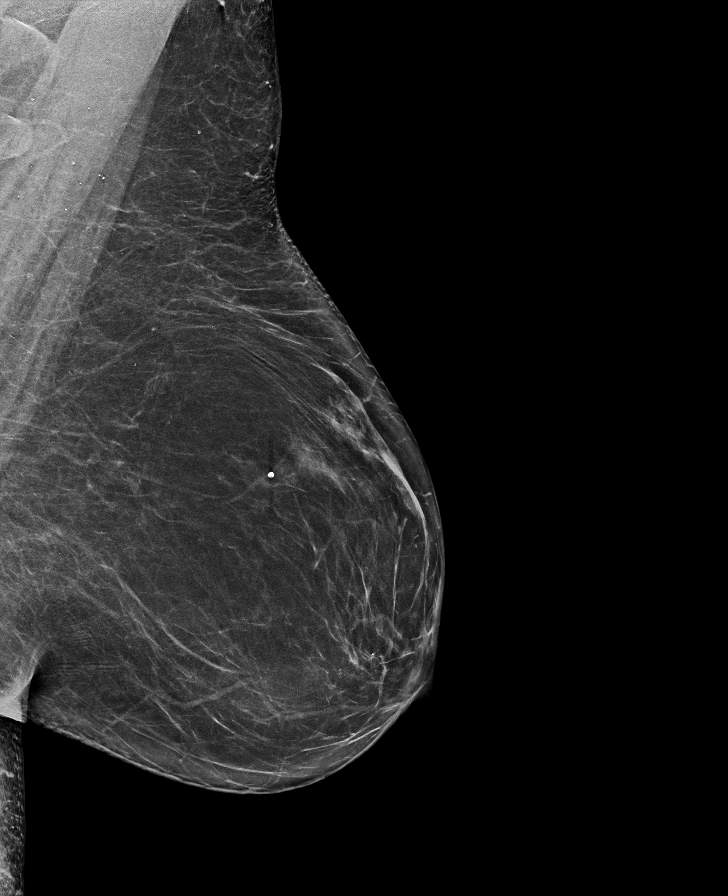

[L CC synth-2D]
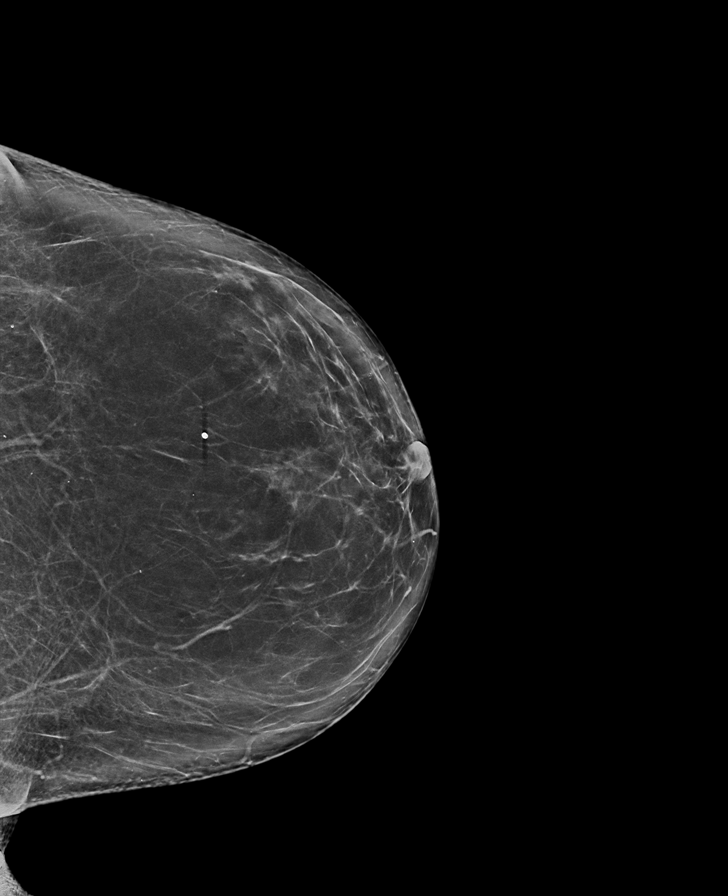

[R MLO synth-2D]
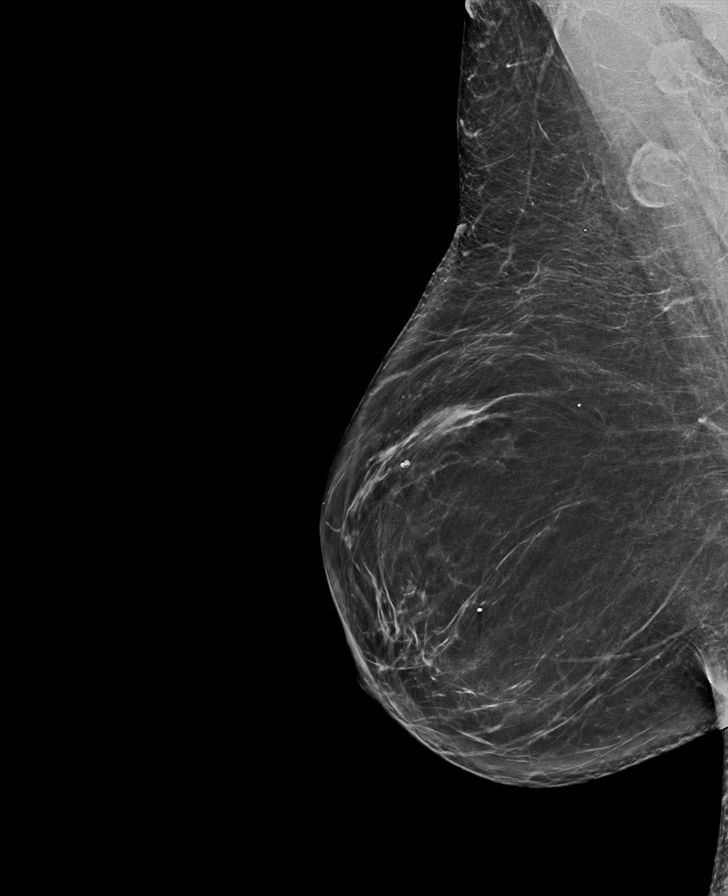

[R CC tomo · tomo slice 39/76.0]
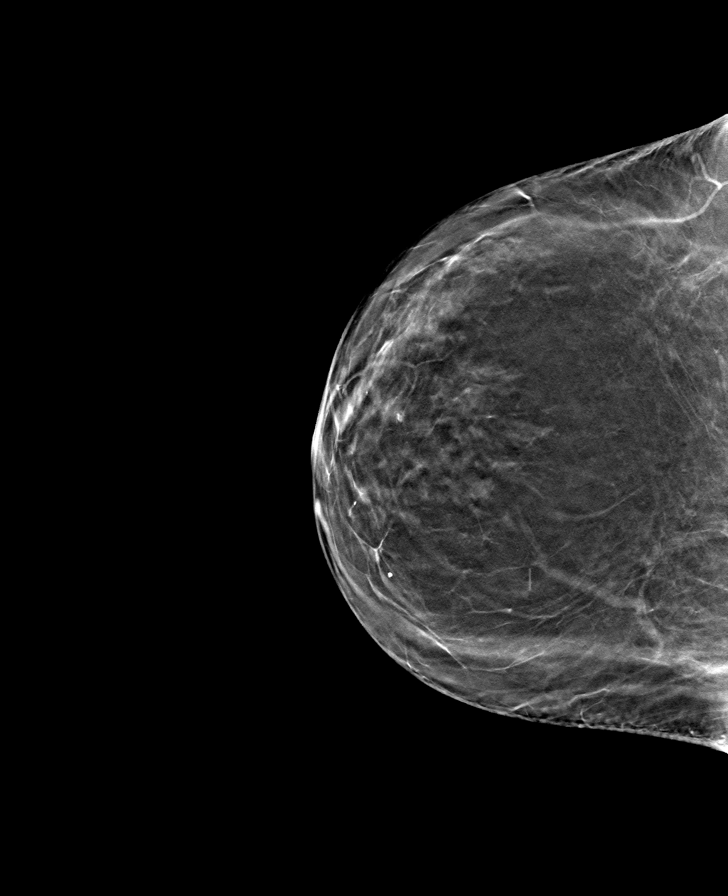

[L CC tomo · tomo slice 43/85.0]
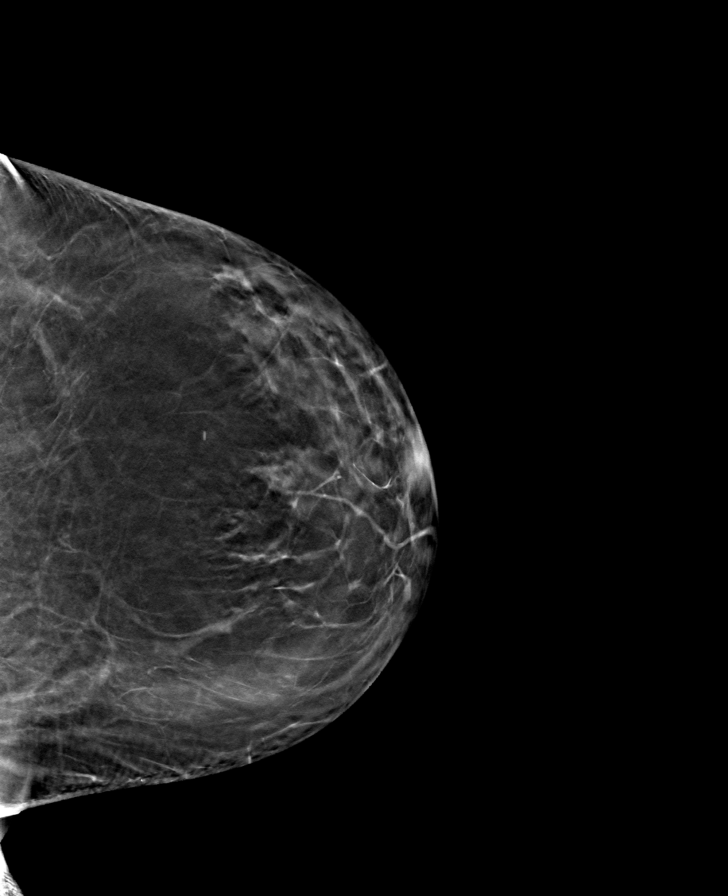

[L MLO tomo · tomo slice 37/74.0]
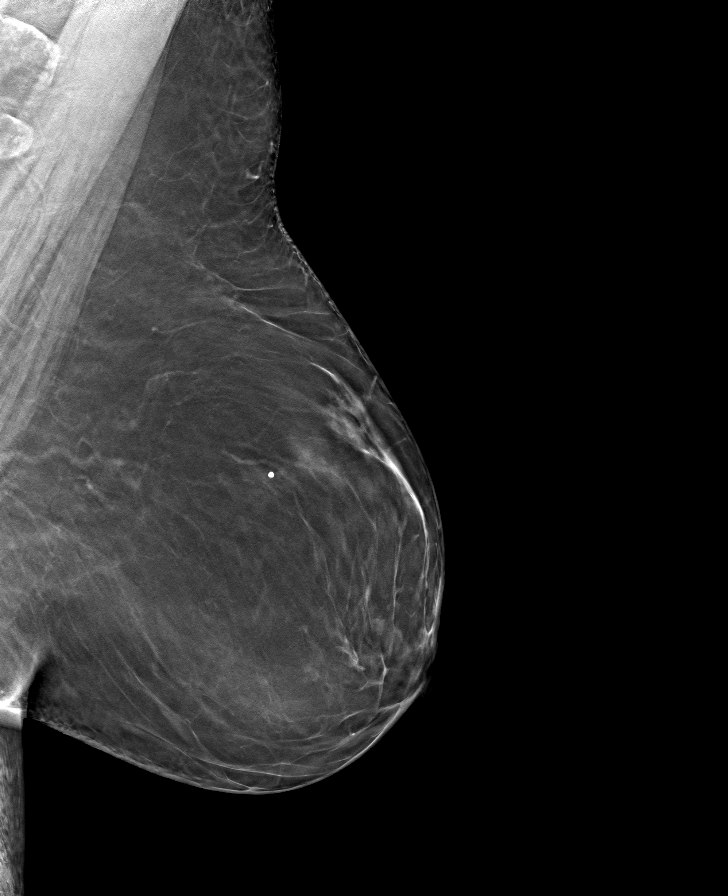

[R MLO tomo · tomo slice 39/78.0]
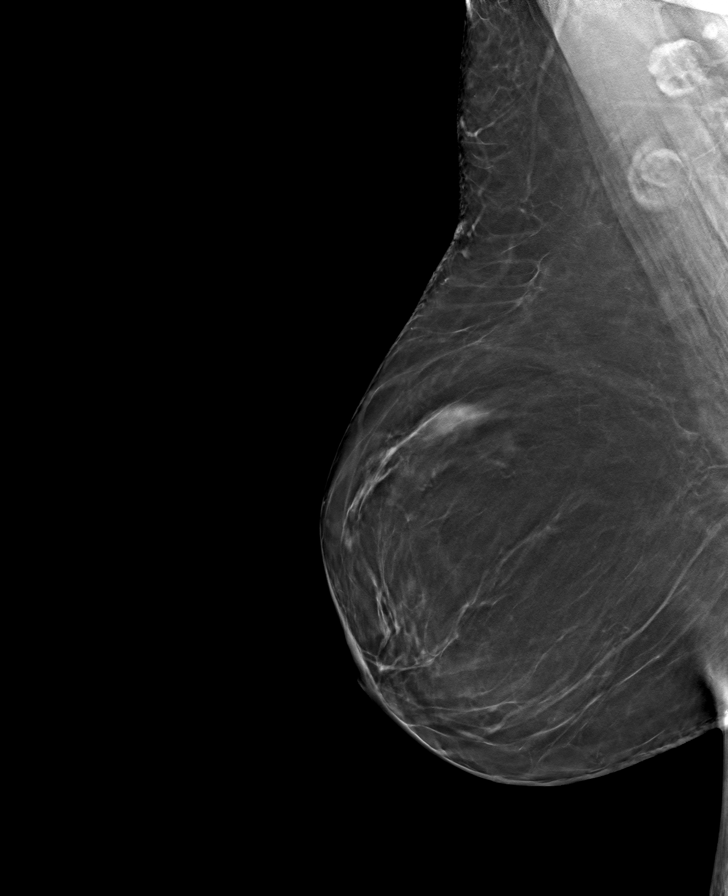

[8 of 24 positions shown; findings below may reference images not displayed]

ACR Breast Density Category b: There are scattered areas of
fibroglandular density.
FINDINGS: There are no findings suspicious for malignancy.
IMPRESSION: No mammographic evidence of malignancy. A result letter of this
screening mammogram will be mailed directly to the patient.

RECOMMENDATION:
Screening mammogram in one year. (Code:51-O-LD2)

BI-RADS CATEGORY  1: Negative.

## 2023-02-07 ENCOUNTER — Other Ambulatory Visit: Payer: Self-pay | Admitting: Nurse Practitioner

## 2023-02-07 DIAGNOSIS — K219 Gastro-esophageal reflux disease without esophagitis: Secondary | ICD-10-CM

## 2023-02-07 DIAGNOSIS — Z79899 Other long term (current) drug therapy: Secondary | ICD-10-CM

## 2023-03-11 ENCOUNTER — Encounter: Payer: Self-pay | Admitting: Nurse Practitioner

## 2023-03-11 ENCOUNTER — Telehealth (INDEPENDENT_AMBULATORY_CARE_PROVIDER_SITE_OTHER): Payer: Managed Care, Other (non HMO) | Admitting: Nurse Practitioner

## 2023-03-11 DIAGNOSIS — F411 Generalized anxiety disorder: Secondary | ICD-10-CM | POA: Diagnosis not present

## 2023-03-11 DIAGNOSIS — R41840 Attention and concentration deficit: Secondary | ICD-10-CM | POA: Diagnosis not present

## 2023-03-11 DIAGNOSIS — Z79899 Other long term (current) drug therapy: Secondary | ICD-10-CM | POA: Diagnosis not present

## 2023-03-11 DIAGNOSIS — E782 Mixed hyperlipidemia: Secondary | ICD-10-CM | POA: Diagnosis not present

## 2023-03-11 MED ORDER — AMPHETAMINE-DEXTROAMPHETAMINE 5 MG PO TABS: 1.0000 | ORAL_TABLET | Freq: Two times a day (BID) | ORAL | 0 refills | Status: DC | PRN

## 2023-03-11 MED ORDER — CLONAZEPAM 0.5 MG PO TABS
ORAL_TABLET | ORAL | 1 refills | Status: DC
Start: 2023-03-11 — End: 2023-07-07

## 2023-03-11 MED ORDER — ATORVASTATIN CALCIUM 40 MG PO TABS: ORAL_TABLET | ORAL | 3 refills | Status: DC

## 2023-03-11 MED ORDER — BUSPIRONE HCL 5 MG PO TABS: 5.0000 mg | ORAL_TABLET | Freq: Two times a day (BID) | ORAL | 1 refills | Status: DC | PRN

## 2023-03-11 NOTE — Progress Notes (Signed)
[facility] [address] [place], [place] [address]  Internal MEDICINE  Telephone Visit  Patient Name: [name]  [id number]  [id number]  Date of Service: 03/11/2023  I connected with the patient at 1300 by telephone and verified the patients identity using two identifiers.   I discussed the limitations, risks, security and privacy concerns of performing an evaluation and management service by telephone and the availability of in person appointments. I also discussed with the patient that there may be a patient responsible charge related to the service.  The patient expressed understanding and agrees to proceed.    Chief Complaint  Patient presents with   Telephone Assessment    [phone]   Telephone Screen    3 month follow up    Anxiety   Hyperlipidemia    HPI [name] presents for a telehealth virtual visit for ADHD, anxiety, high cholesterol and refills ADHD -- current dose is effective, denies any palpitations or other adverse side effects of the medication.  Anxiety -- takes clonazepam prn and taking buspirone High cholesterol -- taking atorvastatin    Current Medication: Outpatient Encounter Medications as of 03/11/2023  Medication Sig   celecoxib (CELEBREX) 100 MG capsule Take 1 capsule (100 mg total) by mouth 2 (two) times daily.   clotrimazole-betamethasone (LOTRISONE) cream Apply 1 Application topically 2 (two) times daily. Until resolved   diphenoxylate-atropine (LOMOTIL) 2.5-0.025 MG tablet Take 1 tablet by mouth 4 (four) times daily as needed for diarrhea or loose stools.   escitalopram (LEXAPRO) 20 MG tablet Take 1 tablet (20 mg total) by mouth daily.   fenofibrate 54 MG tablet TAKE 1 TABLET BY MOUTH DAILY   fluticasone (FLONASE) 50 MCG/ACT nasal spray Place 2 sprays into both nostrils daily.   ibuprofen (ADVIL) 800 MG tablet Take 1 tablet (800 mg total) by mouth 3 (three) times daily.   omeprazole (PRILOSEC) 40 MG capsule TAKE 1 CAPSULE BY  MOUTH IN THE  MORNING   ondansetron (ZOFRAN) 4 MG tablet TAKE 1 TABLET(4 MG) BY MOUTH EVERY 8 HOURS AS NEEDED FOR NAUSEA OR VOMITING   [DISCONTINUED] amphetamine-dextroamphetamine (ADDERALL) 5 MG tablet Take 1 tablet (5 mg total) by mouth 2 (two) times daily as needed (ADHD symptoms).   [DISCONTINUED] amphetamine-dextroamphetamine (ADDERALL) 5 MG tablet Take 1 tablet (5 mg total) by mouth 2 (two) times daily as needed (ADHD symptoms).   [DISCONTINUED] amphetamine-dextroamphetamine (ADDERALL) 5 MG tablet Take 1 tablet (5 mg total) by mouth 2 (two) times daily as needed (ADHD symptoms).   [DISCONTINUED] atorvastatin (LIPITOR) 40 MG tablet TAKE 1 TABLET BY MOUTH  DAILY AT 6 PM.   [DISCONTINUED] busPIRone (BUSPAR) 5 MG tablet Take 1 tablet (5 mg total) by mouth 2 (two) times daily as needed (anxiety).   [DISCONTINUED] clonazePAM (KLONOPIN) 0.5 MG tablet TAKE 1 TABLET(0.5 MG) BY MOUTH AT BEDTIME AS NEEDED FOR ANXIETY   amphetamine-dextroamphetamine (ADDERALL) 5 MG tablet Take 1 tablet (5 mg total) by mouth 2 (two) times daily as needed (ADHD symptoms).   [START ON 04/08/2023] amphetamine-dextroamphetamine (ADDERALL) 5 MG tablet Take 1 tablet (5 mg total) by mouth 2 (two) times daily as needed (ADHD symptoms).   [START ON 05/06/2023] amphetamine-dextroamphetamine (ADDERALL) 5 MG tablet Take 1 tablet (5 mg total) by mouth 2 (two) times daily as needed (ADHD symptoms).   atorvastatin (LIPITOR) 40 MG tablet TAKE 1 TABLET BY MOUTH  DAILY AT 6 PM.   busPIRone (BUSPAR) 5 MG tablet Take 1 tablet (5 mg total) by mouth 2 (  two) times daily as needed (anxiety).   clonazePAM (KLONOPIN) 0.5 MG tablet TAKE 1 TABLET(0.5 MG) BY MOUTH AT BEDTIME AS NEEDED FOR ANXIETY   Facility-Administered Encounter Medications as of 03/11/2023  Medication   cyanocobalamin ((VITAMIN B-12)) injection 1,000 mcg    Surgical History: Past Surgical History:  Procedure Laterality Date   CARPAL TUNNEL RELEASE Right 11/22/2019   Procedure:  CARPAL TUNNEL RELEASE;  Surgeon: Deeann Saint, MD;  Location: [facility] ORS;  Service: Orthopedics;  Laterality: Right;   COLONOSCOPY     TOE SURGERY  2005,2011   ingrown toenails in doctors office   UPPER GI ENDOSCOPY      Medical History: Past Medical History:  Diagnosis Date   ADHD    Allergy    Anxiety    Colon polyp    Diverticulosis    GERD (gastroesophageal reflux disease)    Hyperlipidemia     Family History: Family History  Problem Relation Age of Onset   Lung cancer Mother    Heart disease Father    Pancreatic cancer Sister     Social History   Socioeconomic History   Marital status: Married    Spouse name: Not on file   Number of children: Not on file   Years of education: Not on file   Highest education level: Not on file  Occupational History   Not on file  Tobacco Use   Smoking status: Never   Smokeless tobacco: Never  Vaping Use   Vaping status: Never Used  Substance and Sexual Activity   Alcohol use: Yes    Comment: ocassionally    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  HENT:  Negative for congestion, rhinorrhea, sneezing and sore throat.   Eyes:  Negative for redness.  Respiratory: Negative.  Negative for cough, chest tightness, shortness of breath and wheezing.   Cardiovascular: Negative.  Negative for chest pain and palpitations.  Gastrointestinal:  Negative for abdominal pain, constipation, diarrhea, nausea and vomiting.  Genitourinary:  Negative  for dysuria and frequency.  Musculoskeletal:  Negative for arthralgias, back pain, joint swelling and neck pain.  Skin:  Negative for rash.  Neurological: Negative.  Negative for tremors and numbness.  Hematological:  Negative for adenopathy. Does not bruise/bleed easily.  Psychiatric/Behavioral:  Positive for decreased concentration. Negative for behavioral problems (Depression), sleep disturbance and suicidal ideas. The patient is not nervous/anxious.     Vital Signs: There were no vitals taken for this visit.   Observation/Objective: She is alert and oriented. No acute distress noted.     Assessment/Plan: 1. Mixed hyperlipidemia Continue atorvastatin as prescribed   2. Attention and concentration deficit Continue adderall as prescribed, follow up in 3 months for additional refills.  - amphetamine-dextroamphetamine (ADDERALL) 5 MG tablet; Take 1 tablet (5 mg total) by mouth 2 (two) times daily as needed (ADHD symptoms).  Dispense: 60 tablet; Refill: 0 - amphetamine-dextroamphetamine (ADDERALL) 5 MG  tablet; Take 1 tablet (5 mg total) by mouth 2 (two) times daily as needed (ADHD symptoms).  Dispense: 60 tablet; Refill: 0 - amphetamine-dextroamphetamine (ADDERALL) 5 MG tablet; Take 1 tablet (5 mg total) by mouth 2 (two) times daily as needed (ADHD symptoms).  Dispense: 60 tablet; Refill: 0  3. Encounter for medication review Medication list reviewed, updated and refills ordered - atorvastatin (LIPITOR) 40 MG tablet; TAKE 1 TABLET BY MOUTH  DAILY AT 6 PM.  Dispense: 90 tablet; Refill: 3 - busPIRone (BUSPAR) 5 MG tablet; Take 1 tablet (5 mg total) by mouth 2 (two) times daily as needed (anxiety).  Dispense: 180 tablet; Refill: 1  4. GAD (generalized anxiety disorder) Continue clonazepam as prescribed.  - clonazePAM (KLONOPIN) 0.5 MG tablet; TAKE 1 TABLET(0.5 MG) BY MOUTH AT BEDTIME AS NEEDED FOR ANXIETY  Dispense: 20 tablet; Refill: 1   General Counseling: Annalysa verbalizes  understanding of the findings of today's phone visit and agrees with plan of treatment. I have discussed any further diagnostic evaluation that may be needed or ordered today. We also reviewed her medications today. she has been encouraged to call the office with any questions or concerns that should arise related to todays visit.  Return in about 3 months (around 06/11/2023) for F/U, ADHD med check, Wlliam Grosso PCP.   No orders of the defined types were placed in this encounter.   Meds ordered this encounter  Medications   atorvastatin (LIPITOR) 40 MG tablet    Sig: TAKE 1 TABLET BY MOUTH  DAILY AT 6 PM.    Dispense:  90 tablet    Refill:  3    Requesting 1 year supply   busPIRone (BUSPAR) 5 MG tablet    Sig: Take 1 tablet (5 mg total) by mouth 2 (two) times daily as needed (anxiety).    Dispense:  180 tablet    Refill:  1   clonazePAM (KLONOPIN) 0.5 MG tablet    Sig: TAKE 1 TABLET(0.5 MG) BY MOUTH AT BEDTIME AS NEEDED FOR ANXIETY    Dispense:  20 tablet    Refill:  1   amphetamine-dextroamphetamine (ADDERALL) 5 MG tablet    Sig: Take 1 tablet (5 mg total) by mouth 2 (two) times daily as needed (ADHD symptoms).    Dispense:  60 tablet    Refill:  0    Fill for october   amphetamine-dextroamphetamine (ADDERALL) 5 MG tablet    Sig: Take 1 tablet (5 mg total) by mouth 2 (two) times daily as needed (ADHD symptoms).    Dispense:  60 tablet    Refill:  0    Fill for november   amphetamine-dextroamphetamine (ADDERALL) 5 MG tablet    Sig: Take 1 tablet (5 mg total) by mouth 2 (two) times daily as needed (ADHD symptoms).    Dispense:  60 tablet    Refill:  0    Fill for december    Time spent:30 Minutes Time spent with patient included reviewing progress notes, labs, imaging studies, and discussing plan for follow up.  [place] Controlled Substance Database was reviewed by me for overdose risk score (ORS) if appropriate.  This patient was seen by Sallyanne Kuster, FNP-C in collaboration with  Dr. Beverely Risen as a part of collaborative care agreement.  Sayeed Weatherall R. Tedd Sias, MSN, FNP-C Internal medicine

## 2023-03-21 ENCOUNTER — Other Ambulatory Visit: Payer: Self-pay | Admitting: Nurse Practitioner

## 2023-03-21 DIAGNOSIS — Z79899 Other long term (current) drug therapy: Secondary | ICD-10-CM

## 2023-03-21 DIAGNOSIS — K219 Gastro-esophageal reflux disease without esophagitis: Secondary | ICD-10-CM

## 2023-04-01 ENCOUNTER — Encounter: Payer: Self-pay | Admitting: Nurse Practitioner

## 2023-04-01 ENCOUNTER — Ambulatory Visit (INDEPENDENT_AMBULATORY_CARE_PROVIDER_SITE_OTHER): Payer: Managed Care, Other (non HMO) | Admitting: Nurse Practitioner

## 2023-04-01 VITALS — BP 136/84 | HR 86 | Temp 95.8°F | Resp 16 | Ht 62.0 in | Wt 182.2 lb

## 2023-04-01 DIAGNOSIS — M545 Low back pain, unspecified: Secondary | ICD-10-CM | POA: Diagnosis not present

## 2023-04-01 DIAGNOSIS — Z23 Encounter for immunization: Secondary | ICD-10-CM

## 2023-04-01 DIAGNOSIS — E2839 Other primary ovarian failure: Secondary | ICD-10-CM

## 2023-04-01 DIAGNOSIS — E782 Mixed hyperlipidemia: Secondary | ICD-10-CM | POA: Diagnosis not present

## 2023-04-01 DIAGNOSIS — R41840 Attention and concentration deficit: Secondary | ICD-10-CM

## 2023-04-01 DIAGNOSIS — Z1211 Encounter for screening for malignant neoplasm of colon: Secondary | ICD-10-CM

## 2023-04-01 DIAGNOSIS — G8929 Other chronic pain: Secondary | ICD-10-CM

## 2023-04-01 DIAGNOSIS — Z1212 Encounter for screening for malignant neoplasm of rectum: Secondary | ICD-10-CM

## 2023-04-01 DIAGNOSIS — Z1231 Encounter for screening mammogram for malignant neoplasm of breast: Secondary | ICD-10-CM

## 2023-04-01 DIAGNOSIS — Z0001 Encounter for general adult medical examination with abnormal findings: Secondary | ICD-10-CM | POA: Diagnosis not present

## 2023-04-01 MED ORDER — PNEUMOCOCCAL 20-VAL CONJ VACC 0.5 ML IM SUSY: 0.5000 mL | PREFILLED_SYRINGE | INTRAMUSCULAR | 0 refills | Status: AC

## 2023-04-01 NOTE — Progress Notes (Signed)
[facility] [address] [place], [place] [address]  Internal MEDICINE  Office Visit Note  Patient Name: [name]  [id number]  [id number]  Date of Service: 04/01/2023  Chief Complaint  Patient presents with   Gastroesophageal Reflux   Hyperlipidemia   Annual Exam    HPI [name] presents for an annual well visit and physical exam.  Well-appearing 67 y.o. female with ADHD, low back pain, GERD, OSA, GAD, high cholesterol, and rhinitis.  Routine CRC screening: due for colonsocopy now  Due for mammogram and bone density screening.  Labs: will check before next appt.  New or worsening pain: back  Other concerns: due for pneumonia vaccine  Back is hurting all the time -- joints and back hurting but has been gaining weight per patient. May need to see orthopedic specialist, will discuss again at next visit .   Current Medication: Outpatient Encounter Medications as of 04/01/2023  Medication Sig   amphetamine-dextroamphetamine (ADDERALL) 5 MG tablet Take 1 tablet (5 mg total) by mouth 2 (two) times daily as needed (ADHD symptoms).   [START ON 04/08/2023] amphetamine-dextroamphetamine (ADDERALL) 5 MG tablet Take 1 tablet (5 mg total) by mouth 2 (two) times daily as needed (ADHD symptoms).   [START ON 05/06/2023] amphetamine-dextroamphetamine (ADDERALL) 5 MG tablet Take 1 tablet (5 mg total) by mouth 2 (two) times daily as needed (ADHD symptoms).   atorvastatin (LIPITOR) 40 MG tablet TAKE 1 TABLET BY MOUTH  DAILY AT 6 PM.   busPIRone (BUSPAR) 5 MG tablet Take 1 tablet (5 mg total) by mouth 2 (two) times daily as needed (anxiety).   celecoxib (CELEBREX) 100 MG capsule Take 1 capsule (100 mg total) by mouth 2 (two) times daily.   clonazePAM (KLONOPIN) 0.5 MG tablet TAKE 1 TABLET(0.5 MG) BY MOUTH AT BEDTIME AS NEEDED FOR ANXIETY   clotrimazole-betamethasone (LOTRISONE) cream Apply 1 Application topically 2 (two) times daily. Until resolved   diphenoxylate-atropine (LOMOTIL)  2.5-0.025 MG tablet Take 1 tablet by mouth 4 (four) times daily as needed for diarrhea or loose stools.   escitalopram (LEXAPRO) 20 MG tablet Take 1 tablet (20 mg total) by mouth daily.   fenofibrate 54 MG tablet TAKE 1 TABLET BY MOUTH DAILY   fluticasone (FLONASE) 50 MCG/ACT nasal spray Place 2 sprays into both nostrils daily.   ibuprofen (ADVIL) 800 MG tablet Take 1 tablet (800 mg total) by mouth 3 (three) times daily.   omeprazole (PRILOSEC) 40 MG capsule TAKE 1 CAPSULE BY MOUTH IN THE  MORNING   ondansetron (ZOFRAN) 4 MG tablet TAKE 1 TABLET(4 MG) BY MOUTH EVERY 8 HOURS AS NEEDED FOR NAUSEA OR VOMITING   [DISCONTINUED] pneumococcal 20-valent conjugate vaccine (PREVNAR 20) 0.5 ML injection Inject 0.5 mLs into the muscle tomorrow at 10 am.   pneumococcal 20-valent conjugate vaccine (PREVNAR 20) 0.5 ML injection Inject 0.5 mLs into the muscle tomorrow at 10 am for 1 dose.   Facility-Administered Encounter Medications as of 04/01/2023  Medication   cyanocobalamin ((VITAMIN B-12)) injection 1,000 mcg    Surgical History: Past Surgical History:  Procedure Laterality Date   CARPAL TUNNEL RELEASE Right 11/22/2019   Procedure: CARPAL TUNNEL RELEASE;  Surgeon: Deeann Saint, MD;  Location: [facility] ORS;  Service: Orthopedics;  Laterality: Right;   COLONOSCOPY     TOE SURGERY  2005,2011   ingrown toenails in doctors office   UPPER GI ENDOSCOPY      Medical History: Past Medical History:  Diagnosis Date   ADHD    Allergy  Anxiety    Colon polyp    Diverticulosis    GERD (gastroesophageal reflux disease)    Hyperlipidemia     Family History: Family History  Problem Relation Age of Onset   Lung cancer Mother    Heart disease Father    Pancreatic cancer Sister     Social History   Socioeconomic History   Marital status: Married    Spouse name: Not on file   Number of children: Not on file   Years of education: Not on file   Highest education level: Not on file  Occupational  History   Not on file  Tobacco Use   Smoking status: Never   Smokeless tobacco: Never  Vaping Use   Vaping status: Never Used  Substance and Sexual Activity   Alcohol use: Yes    Comment: ocassionally    Drug use: Yes    Types: Marijuana    Comment: daily use   Sexual activity: Not on file  Other Topics Concern   Not on file  Social History Narrative   Not on file   Social Determinants of Health   Financial Resource Strain: Not on file  Food Insecurity: Not on file  Transportation Needs: Not on file  Physical Activity: Not on file  Stress: Not on file  Social Connections: Not on file  Intimate Partner Violence: Not on file      Review of Systems  Constitutional:  Positive for fatigue. Negative for activity change, appetite change, chills, fever and unexpected weight change.  HENT: Negative.  Negative for congestion, ear pain, rhinorrhea, sore throat and trouble swallowing.   Eyes: Negative.   Respiratory: Negative.  Negative for cough, chest tightness, shortness of breath and wheezing.   Cardiovascular: Negative.  Negative for chest pain.  Gastrointestinal: Negative.  Negative for abdominal pain, blood in stool, constipation, diarrhea, nausea and vomiting.  Endocrine: Negative.   Genitourinary: Negative.  Negative for difficulty urinating, dysuria, frequency, hematuria and urgency.  Musculoskeletal:  Positive for arthralgias and back pain. Negative for joint swelling, myalgias and neck pain.  Skin:  Negative for rash.  Allergic/Immunologic: Negative.  Negative for immunocompromised state.  Neurological: Negative.  Negative for dizziness, seizures, numbness and headaches.  Hematological: Negative.   Psychiatric/Behavioral:  Positive for decreased concentration. Negative for behavioral problems, self-injury, sleep disturbance and suicidal ideas. The patient is nervous/anxious.     Vital Signs: BP 136/84   Pulse 86   Temp (!) 95.8 F (35.4 C)   Resp 16   Ht 5\' 2"   (1.575 m)   Wt 182 lb 3.2 oz (82.6 kg)   SpO2 96%   BMI 33.32 kg/m    Physical Exam Vitals reviewed.  Constitutional:      General: She is not in acute distress.    Appearance: Normal appearance. She is well-developed. She is obese. She is not ill-appearing or diaphoretic.  HENT:     Head: Normocephalic and atraumatic.     Right Ear: Tympanic membrane, ear canal and external ear normal.     Left Ear: Tympanic membrane, ear canal and external ear normal.     Nose: Nose normal. No congestion or rhinorrhea.     Mouth/Throat:     Mouth: Mucous membranes are moist.     Pharynx: Oropharynx is clear. No oropharyngeal exudate or posterior oropharyngeal erythema.  Eyes:     General: No scleral icterus.       Right eye: No discharge.  Left eye: No discharge.     Extraocular Movements: Extraocular movements intact.     Conjunctiva/sclera: Conjunctivae normal.     Pupils: Pupils are equal, round, and reactive to light.  Neck:     Thyroid: No thyromegaly.     Vascular: No JVD.     Trachea: No tracheal deviation.  Cardiovascular:     Rate and Rhythm: Normal rate and regular rhythm.     Heart sounds: Normal heart sounds. No murmur heard.    No friction rub. No gallop.  Pulmonary:     Effort: Pulmonary effort is normal. No respiratory distress.     Breath sounds: Normal breath sounds. No stridor. No wheezing or rales.  Chest:     Chest wall: No tenderness.  Abdominal:     General: Bowel sounds are normal. There is no distension.     Palpations: Abdomen is soft. There is no mass.     Tenderness: There is no abdominal tenderness. There is no guarding or rebound.  Musculoskeletal:        General: No tenderness or deformity. Normal range of motion.     Cervical back: Normal range of motion and neck supple.  Lymphadenopathy:     Cervical: No cervical adenopathy.  Skin:    General: Skin is warm and dry.     Capillary Refill: Capillary refill takes less than 2 seconds.      Coloration: Skin is not pale.     Findings: No erythema or rash.  Neurological:     Mental Status: She is alert and oriented to person, place, and time.     Cranial Nerves: No cranial nerve deficit.     Motor: No abnormal muscle tone.     Coordination: Coordination normal.     Deep Tendon Reflexes: Reflexes are normal and symmetric.  Psychiatric:        Mood and Affect: Mood normal.        Behavior: Behavior normal.        Thought Content: Thought content normal.        Judgment: Judgment normal.        Assessment/Plan: 1. Encounter for routine adult health examination with abnormal findings Age-appropriate preventive screenings and vaccinations discussed, annual physical exam completed. Routine labs for health maintenance will be ordered before next office visit. PHM updated.   2. Chronic bilateral low back pain without sciatica Continue to monitor, will readdress at next office visit   3. Attention and concentration deficit Continue adderall as prescribed, refills due in December   4. Mixed hyperlipidemia Continue atorvastatin as prescribed, will repeat cholesterol panel before next office visit.   5. Ovarian failure due to menopause Dexa scan ordered  - DG Bone Density; Future  6. Need for vaccination Flu vaccine administered in office today, pneumonia vaccine ordered and sent to pharmacy  - Influenza, MDCK, trivalent, PF(Flucelvax egg-free) - pneumococcal 20-valent conjugate vaccine (PREVNAR 20) 0.5 ML injection; Inject 0.5 mLs into the muscle tomorrow at 10 am for 1 dose.  Dispense: 0.5 mL; Refill: 0  7. Encounter for screening mammogram for malignant neoplasm of breast Screening mammogram ordered  - MM 3D SCREENING MAMMOGRAM BILATERAL BREAST; Future  8. Screening for colorectal cancer Referred to GI - Ambulatory referral to Gastroenterology      General Counseling: Zoria verbalizes understanding of the findings of todays visit and agrees with plan of  treatment. I have discussed any further diagnostic evaluation that may be needed or ordered today. We also reviewed  her medications today. she has been encouraged to call the office with any questions or concerns that should arise related to todays visit.    Orders Placed This Encounter  Procedures   MM 3D SCREENING MAMMOGRAM BILATERAL BREAST   DG Bone Density   Influenza, MDCK, trivalent, PF(Flucelvax egg-free)   Ambulatory referral to Gastroenterology    Meds ordered this encounter  Medications   pneumococcal 20-valent conjugate vaccine (PREVNAR 20) 0.5 ML injection    Sig: Inject 0.5 mLs into the muscle tomorrow at 10 am for 1 dose.    Dispense:  0.5 mL    Refill:  0    Return in about 8 weeks (around 05/26/2023) for F/U, anxiety med refill, ADHD med check, Mechel Haggard PCP.   Total time spent:30 Minutes Time spent includes review of chart, medications, test results, and follow up plan with the patient.   [place] Controlled Substance Database was reviewed by me.  This patient was seen by Sallyanne Kuster, FNP-C in collaboration with Dr. Beverely Risen as a part of collaborative care agreement.  Hailei Besser R. Tedd Sias, MSN, FNP-C Internal medicine

## 2023-04-02 ENCOUNTER — Telehealth: Payer: Self-pay | Admitting: Nurse Practitioner

## 2023-04-02 NOTE — Telephone Encounter (Deleted)
GI referral sent via [facility] to Dr. Norma Fredrickson with [facility]. Notified patient. Gave pt telephone # [phone]) [phone]

## 2023-04-02 NOTE — Telephone Encounter (Signed)
GI referral sent via [facility] to Dr. Norma Fredrickson with [facility]. Notified patient. Gave pt telephone # [phone]) [phone]

## 2023-04-11 ENCOUNTER — Telehealth: Payer: Self-pay | Admitting: Nurse Practitioner

## 2023-04-11 NOTE — Telephone Encounter (Signed)
GI appointment 07/14/2023 @ [name]

## 2023-04-14 ENCOUNTER — Other Ambulatory Visit: Payer: Self-pay | Admitting: Nurse Practitioner

## 2023-04-14 DIAGNOSIS — F411 Generalized anxiety disorder: Secondary | ICD-10-CM

## 2023-04-17 ENCOUNTER — Other Ambulatory Visit: Payer: Self-pay | Admitting: Nurse Practitioner

## 2023-04-17 DIAGNOSIS — F411 Generalized anxiety disorder: Secondary | ICD-10-CM

## 2023-05-03 ENCOUNTER — Other Ambulatory Visit: Payer: Self-pay | Admitting: Nurse Practitioner

## 2023-05-03 DIAGNOSIS — K219 Gastro-esophageal reflux disease without esophagitis: Secondary | ICD-10-CM

## 2023-05-03 DIAGNOSIS — Z79899 Other long term (current) drug therapy: Secondary | ICD-10-CM

## 2023-05-06 ENCOUNTER — Inpatient Hospital Stay: Admission: RE | Admit: 2023-05-06 | Payer: Managed Care, Other (non HMO) | Source: Ambulatory Visit

## 2023-05-22 ENCOUNTER — Encounter: Payer: Self-pay | Admitting: Nurse Practitioner

## 2023-05-22 ENCOUNTER — Ambulatory Visit (INDEPENDENT_AMBULATORY_CARE_PROVIDER_SITE_OTHER): Payer: Managed Care, Other (non HMO) | Admitting: Nurse Practitioner

## 2023-05-22 VITALS — BP 134/88 | HR 73 | Temp 97.5°F | Resp 16 | Ht 62.0 in | Wt 185.4 lb

## 2023-05-22 DIAGNOSIS — E66811 Obesity, class 1: Secondary | ICD-10-CM

## 2023-05-22 DIAGNOSIS — M545 Low back pain, unspecified: Secondary | ICD-10-CM

## 2023-05-22 DIAGNOSIS — E538 Deficiency of other specified B group vitamins: Secondary | ICD-10-CM

## 2023-05-22 DIAGNOSIS — E6609 Other obesity due to excess calories: Secondary | ICD-10-CM

## 2023-05-22 DIAGNOSIS — E782 Mixed hyperlipidemia: Secondary | ICD-10-CM | POA: Diagnosis not present

## 2023-05-22 DIAGNOSIS — G8929 Other chronic pain: Secondary | ICD-10-CM

## 2023-05-22 DIAGNOSIS — R5383 Other fatigue: Secondary | ICD-10-CM | POA: Diagnosis not present

## 2023-05-22 DIAGNOSIS — R7301 Impaired fasting glucose: Secondary | ICD-10-CM | POA: Diagnosis not present

## 2023-05-22 DIAGNOSIS — E559 Vitamin D deficiency, unspecified: Secondary | ICD-10-CM

## 2023-05-22 DIAGNOSIS — R41840 Attention and concentration deficit: Secondary | ICD-10-CM

## 2023-05-22 DIAGNOSIS — Z6833 Body mass index (BMI) 33.0-33.9, adult: Secondary | ICD-10-CM

## 2023-05-22 MED ORDER — AMPHETAMINE-DEXTROAMPHETAMINE 5 MG PO TABS: 1.0000 | ORAL_TABLET | Freq: Two times a day (BID) | ORAL | 0 refills | Status: DC | PRN

## 2023-05-22 NOTE — Progress Notes (Signed)
[facility] [address] [place], [place] [address]  Internal MEDICINE  Office Visit Note  Patient Name: [name]  [id number]  [id number]  Date of Service: 05/22/2023  Chief Complaint  Patient presents with   Gastroesophageal Reflux   Hyperlipidemia   Follow-up    HPI Carmilita presents for a follow-up visit for ADHD, routine lab orders, obesity and high cholesterol ADHD -- current dose remains effective. Blood pressure and heart rate are stable. Denies palpitations or any other adverse side effects of the medication.  Weight gain -- has been on fmla and recently went back to work, has gained some weight, going back to [employer] next week and possible rejoining the [employer] for water aerobics Hyperlipidemia -- current taking atorvastatin. Due for repeat labs  Due for updated labs     Current Medication: Outpatient Encounter Medications as of 05/22/2023  Medication Sig   atorvastatin (LIPITOR) 40 MG tablet TAKE 1 TABLET BY MOUTH  DAILY AT 6 PM.   busPIRone (BUSPAR) 5 MG tablet Take 1 tablet (5 mg total) by mouth 2 (two) times daily as needed (anxiety).   celecoxib (CELEBREX) 100 MG capsule Take 1 capsule (100 mg total) by mouth 2 (two) times daily.   clonazePAM (KLONOPIN) 0.5 MG tablet TAKE 1 TABLET(0.5 MG) BY MOUTH AT BEDTIME AS NEEDED FOR ANXIETY   clotrimazole-betamethasone (LOTRISONE) cream Apply 1 Application topically 2 (two) times daily. Until resolved   diphenoxylate-atropine (LOMOTIL) 2.5-0.025 MG tablet Take 1 tablet by mouth 4 (four) times daily as needed for diarrhea or loose stools.   escitalopram (LEXAPRO) 20 MG tablet TAKE 1 TABLET(20 MG) BY MOUTH DAILY   fenofibrate 54 MG tablet TAKE 1 TABLET BY MOUTH DAILY   fluticasone (FLONASE) 50 MCG/ACT nasal spray Place 2 sprays into both nostrils daily.   ibuprofen (ADVIL) 800 MG tablet Take 1 tablet (800 mg total) by mouth 3 (three) times daily.   omeprazole (PRILOSEC) 40 MG capsule TAKE 1 CAPSULE BY  MOUTH IN THE  MORNING   ondansetron (ZOFRAN) 4 MG tablet TAKE 1 TABLET(4 MG) BY MOUTH EVERY 8 HOURS AS NEEDED FOR NAUSEA OR VOMITING   [DISCONTINUED] amphetamine-dextroamphetamine (ADDERALL) 5 MG tablet Take 1 tablet (5 mg total) by mouth 2 (two) times daily as needed (ADHD symptoms).   [DISCONTINUED] amphetamine-dextroamphetamine (ADDERALL) 5 MG tablet Take 1 tablet (5 mg total) by mouth 2 (two) times daily as needed (ADHD symptoms).   [DISCONTINUED] amphetamine-dextroamphetamine (ADDERALL) 5 MG tablet Take 1 tablet (5 mg total) by mouth 2 (two) times daily as needed (ADHD symptoms).   amphetamine-dextroamphetamine (ADDERALL) 5 MG tablet Take 1 tablet (5 mg total) by mouth 2 (two) times daily as needed (ADHD symptoms).   [START ON 06/19/2023] amphetamine-dextroamphetamine (ADDERALL) 5 MG tablet Take 1 tablet (5 mg total) by mouth 2 (two) times daily as needed (ADHD symptoms).   [START ON 07/17/2023] amphetamine-dextroamphetamine (ADDERALL) 5 MG tablet Take 1 tablet (5 mg total) by mouth 2 (two) times daily as needed (ADHD symptoms).   Facility-Administered Encounter Medications as of 05/22/2023  Medication   cyanocobalamin ((VITAMIN B-12)) injection 1,000 mcg    Surgical History: Past Surgical History:  Procedure Laterality Date   CARPAL TUNNEL RELEASE Right 11/22/2019   Procedure: CARPAL TUNNEL RELEASE;  Surgeon: Deeann Saint, MD;  Location: [facility] ORS;  Service: Orthopedics;  Laterality: Right;   COLONOSCOPY     TOE SURGERY  2005,2011   ingrown toenails in doctors office   UPPER GI ENDOSCOPY  Medical History: Past Medical History:  Diagnosis Date   ADHD    Allergy    Anxiety    Colon polyp    Diverticulosis    GERD (gastroesophageal reflux disease)    Hyperlipidemia     Family History: Family History  Problem Relation Age of Onset   Lung cancer Mother    Heart disease Father    Pancreatic cancer Sister     Social History   Socioeconomic History   Marital status:  Married    Spouse name: Not on file   Number of children: Not on file   Years of education: Not on file   Highest education level: Not on file  Occupational History   Not on file  Tobacco Use   Smoking status: Never   Smokeless tobacco: Never  Vaping Use   Vaping status: Never Used  Substance and Sexual Activity   Alcohol use: Yes    Comment: ocassionally    Drug use: Yes    Types: Marijuana    Comment: daily use   Sexual activity: Not on file  Other Topics Concern   Not on file  Social History Narrative   Not on file   Social Drivers of Health   Financial Resource Strain: Not on file  Food Insecurity: Not on file  Transportation Needs: Not on file  Physical Activity: Not on file  Stress: Not on file  Social Connections: Not on file  Intimate Partner Violence: Not on file      Review of Systems  Constitutional:  Negative for chills, fatigue and unexpected weight change.  HENT:  Negative for congestion, rhinorrhea, sneezing and sore throat.   Eyes:  Negative for redness.  Respiratory: Negative.  Negative for cough, chest tightness, shortness of breath and wheezing.   Cardiovascular: Negative.  Negative for chest pain and palpitations.  Gastrointestinal:  Negative for abdominal pain, constipation, diarrhea, nausea and vomiting.  Genitourinary:  Negative for dysuria and frequency.  Musculoskeletal:  Negative for arthralgias, back pain, joint swelling and neck pain.  Skin:  Negative for rash.  Neurological: Negative.  Negative for tremors and numbness.  Hematological:  Negative for adenopathy. Does not bruise/bleed easily.  Psychiatric/Behavioral:  Positive for decreased concentration. Negative for behavioral problems (Depression), sleep disturbance and suicidal ideas. The patient is not nervous/anxious.     Vital Signs: BP 134/88   Pulse 73   Temp (!) 97.5 F (36.4 C)   Resp 16   Ht 5\' 2"  (1.575 m)   Wt 185 lb 6.4 oz (84.1 kg)   SpO2 99%   BMI 33.91 kg/m     Physical Exam Vitals reviewed.  Constitutional:      General: She is not in acute distress.    Appearance: Normal appearance. She is not ill-appearing.  HENT:     Head: Normocephalic and atraumatic.  Eyes:     Pupils: Pupils are equal, round, and reactive to light.  Cardiovascular:     Rate and Rhythm: Normal rate and regular rhythm.  Pulmonary:     Effort: Pulmonary effort is normal. No respiratory distress.  Neurological:     Mental Status: She is alert and oriented to person, place, and time.  Psychiatric:        Mood and Affect: Mood normal.        Behavior: Behavior normal.        Assessment/Plan: 1. Impaired fasting glucose (Primary) Routine labs ordered. - Hgb A1C w/o eAG - CMP14+EGFR - TSH + free  T4  2. Other fatigue Routine labs ordered  - Hgb A1C w/o eAG - CMP14+EGFR - Vitamin D (25 hydroxy) - B12 and Folate Panel - CBC with Differential/Platelet - TSH + free T4  3. Mixed hyperlipidemia Routine labs ordered  - Hgb A1C w/o eAG - CMP14+EGFR - Lipid Profile - TSH + free T4  4. B12 deficiency Routine labs ordered.  - B12 and Folate Panel - CBC with Differential/Platelet  5. Vitamin D deficiency Routine labs ordered - Vitamin D (25 hydroxy)  6. Chronic bilateral low back pain without sciatica Manageable with celebrex  7. Attention and concentration deficit Continue adderall as prescribed. Follow up in 3 months for additional refills.  - amphetamine-dextroamphetamine (ADDERALL) 5 MG tablet; Take 1 tablet (5 mg total) by mouth 2 (two) times daily as needed (ADHD symptoms).  Dispense: 60 tablet; Refill: 0 - amphetamine-dextroamphetamine (ADDERALL) 5 MG tablet; Take 1 tablet (5 mg total) by mouth 2 (two) times daily as needed (ADHD symptoms).  Dispense: 60 tablet; Refill: 0 - amphetamine-dextroamphetamine (ADDERALL) 5 MG tablet; Take 1 tablet (5 mg total) by mouth 2 (two) times daily as needed (ADHD symptoms).  Dispense: 60 tablet; Refill: 0  8.  Class 1 obesity due to excess calories with serious comorbidity and body mass index (BMI) of 33.0 to 33.9 in adult Routine labs ordered  - Hgb A1C w/o eAG - CMP14+EGFR - Vitamin D (25 hydroxy) - B12 and Folate Panel - Lipid Profile - CBC with Differential/Platelet - TSH + free T4   General Counseling: Solaris verbalizes understanding of the findings of todays visit and agrees with plan of treatment. I have discussed any further diagnostic evaluation that may be needed or ordered today. We also reviewed her medications today. she has been encouraged to call the office with any questions or concerns that should arise related to todays visit.    Orders Placed This Encounter  Procedures   Hgb A1C w/o eAG   CMP14+EGFR   Vitamin D (25 hydroxy)   B12 and Folate Panel   Lipid Profile   CBC with Differential/Platelet   TSH + free T4    Meds ordered this encounter  Medications   amphetamine-dextroamphetamine (ADDERALL) 5 MG tablet    Sig: Take 1 tablet (5 mg total) by mouth 2 (two) times daily as needed (ADHD symptoms).    Dispense:  60 tablet    Refill:  0    Fill for december   amphetamine-dextroamphetamine (ADDERALL) 5 MG tablet    Sig: Take 1 tablet (5 mg total) by mouth 2 (two) times daily as needed (ADHD symptoms).    Dispense:  60 tablet    Refill:  0    Fill for january   amphetamine-dextroamphetamine (ADDERALL) 5 MG tablet    Sig: Take 1 tablet (5 mg total) by mouth 2 (two) times daily as needed (ADHD symptoms).    Dispense:  60 tablet    Refill:  0    Fill for february    Return in about 1 month (around 06/22/2023) for F/U, Labs, [facility] PCP and also need f/u in 12 weeks for ADHD refills.   Total time spent:30 Minutes Time spent includes review of chart, medications, test results, and follow up plan with the patient.   [place] Controlled Substance Database was reviewed by me.  This patient was seen by Sallyanne Kuster, FNP-C in collaboration with Dr. Beverely Risen as a part  of collaborative care agreement.   Shan Padgett R. Tedd Sias, MSN, FNP-C Internal medicine

## 2023-06-05 ENCOUNTER — Other Ambulatory Visit: Payer: Self-pay | Admitting: Nurse Practitioner

## 2023-06-05 DIAGNOSIS — Z79899 Other long term (current) drug therapy: Secondary | ICD-10-CM

## 2023-06-05 DIAGNOSIS — K219 Gastro-esophageal reflux disease without esophagitis: Secondary | ICD-10-CM

## 2023-06-18 ENCOUNTER — Other Ambulatory Visit: Payer: Self-pay | Admitting: Nurse Practitioner

## 2023-06-18 DIAGNOSIS — M545 Low back pain, unspecified: Secondary | ICD-10-CM

## 2023-06-23 ENCOUNTER — Ambulatory Visit
Admission: RE | Admit: 2023-06-23 | Discharge: 2023-06-23 | Disposition: A | Discharge status: Home / Self Care | Payer: Managed Care, Other (non HMO) | Source: Ambulatory Visit | Attending: Nurse Practitioner | Admitting: Nurse Practitioner

## 2023-06-23 ENCOUNTER — Ambulatory Visit: Payer: Managed Care, Other (non HMO) | Admitting: Nurse Practitioner

## 2023-06-23 DIAGNOSIS — E2839 Other primary ovarian failure: Secondary | ICD-10-CM | POA: Insufficient documentation

## 2023-06-23 DIAGNOSIS — Z1231 Encounter for screening mammogram for malignant neoplasm of breast: Secondary | ICD-10-CM | POA: Diagnosis present

## 2023-07-03 ENCOUNTER — Other Ambulatory Visit: Payer: Managed Care, Other (non HMO)

## 2023-07-03 LAB — CBC WITH DIFFERENTIAL/PLATELET
Basophils Absolute: 0 10*3/uL (ref 0.0–0.2)
Basos: 1 %
EOS (ABSOLUTE): 0.2 10*3/uL (ref 0.0–0.4)
Eos: 4 %
Hematocrit: 37.2 % (ref 34.0–46.6)
Hemoglobin: 12.7 g/dL (ref 11.1–15.9)
Immature Grans (Abs): 0.1 10*3/uL (ref 0.0–0.1)
Immature Granulocytes: 1 %
Lymphocytes Absolute: 2.6 10*3/uL (ref 0.7–3.1)
Lymphs: 39 %
MCH: 31.8 pg (ref 26.6–33.0)
MCHC: 34.1 g/dL (ref 31.5–35.7)
MCV: 93 fL (ref 79–97)
Monocytes Absolute: 0.5 10*3/uL (ref 0.1–0.9)
Monocytes: 7 %
Neutrophils Absolute: 3.3 10*3/uL (ref 1.4–7.0)
Neutrophils: 48 %
Platelets: 270 10*3/uL (ref 150–450)
RBC: 3.99 x10E6/uL (ref 3.77–5.28)
RDW: 12.7 % (ref 11.7–15.4)
WBC: 6.6 10*3/uL (ref 3.4–10.8)

## 2023-07-03 LAB — TSH+FREE T4
Free T4: 0.94 ng/dL (ref 0.82–1.77)
TSH: 1.06 u[IU]/mL (ref 0.450–4.500)

## 2023-07-03 LAB — HGB A1C W/O EAG: Hgb A1c MFr Bld: 5.8 % — ABNORMAL HIGH (ref 4.8–5.6)

## 2023-07-03 LAB — CMP14+EGFR
ALT: 25 [IU]/L (ref 0–32)
AST: 26 [IU]/L (ref 0–40)
Albumin: 4.3 g/dL (ref 3.9–4.9)
Alkaline Phosphatase: 70 [IU]/L (ref 44–121)
BUN/Creatinine Ratio: 22 (ref 12–28)
BUN: 15 mg/dL (ref 8–27)
Bilirubin Total: 0.3 mg/dL (ref 0.0–1.2)
CO2: 25 mmol/L (ref 20–29)
Calcium: 9.8 mg/dL (ref 8.7–10.3)
Chloride: 100 mmol/L (ref 96–106)
Creatinine, Ser: 0.67 mg/dL (ref 0.57–1.00)
Globulin, Total: 2.2 g/dL (ref 1.5–4.5)
Glucose: 85 mg/dL (ref 70–99)
Potassium: 4.8 mmol/L (ref 3.5–5.2)
Sodium: 139 mmol/L (ref 134–144)
Total Protein: 6.5 g/dL (ref 6.0–8.5)
eGFR: 95 mL/min/{1.73_m2} (ref >59)

## 2023-07-03 LAB — LIPID PANEL
Chol/HDL Ratio: 3.9 {ratio} (ref 0.0–4.4)
Cholesterol, Total: 184 mg/dL (ref 100–199)
HDL: 47 mg/dL (ref >39)
LDL Chol Calc (NIH): 91 mg/dL (ref 0–99)
Triglycerides: 276 mg/dL — ABNORMAL HIGH (ref 0–149)
VLDL Cholesterol Cal: 46 mg/dL — ABNORMAL HIGH (ref 5–40)

## 2023-07-03 LAB — B12 AND FOLATE PANEL
Folate: >20 ng/mL (ref >3.0)
Vitamin B-12: 443 pg/mL (ref 232–1245)

## 2023-07-03 LAB — VITAMIN D 25 HYDROXY (VIT D DEFICIENCY, FRACTURES): Vit D, 25-Hydroxy: 42.2 ng/mL (ref 30.0–100.0)

## 2023-07-07 ENCOUNTER — Encounter: Payer: Self-pay | Admitting: Nurse Practitioner

## 2023-07-07 ENCOUNTER — Ambulatory Visit: Payer: Managed Care, Other (non HMO) | Admitting: Nurse Practitioner

## 2023-07-07 VITALS — BP 138/85 | HR 72 | Temp 98.3°F | Resp 16 | Ht 62.0 in | Wt 180.2 lb

## 2023-07-07 DIAGNOSIS — M545 Low back pain, unspecified: Secondary | ICD-10-CM | POA: Diagnosis not present

## 2023-07-07 DIAGNOSIS — R41840 Attention and concentration deficit: Secondary | ICD-10-CM | POA: Diagnosis not present

## 2023-07-07 DIAGNOSIS — F411 Generalized anxiety disorder: Secondary | ICD-10-CM

## 2023-07-07 DIAGNOSIS — Z79899 Other long term (current) drug therapy: Secondary | ICD-10-CM | POA: Diagnosis not present

## 2023-07-07 DIAGNOSIS — K219 Gastro-esophageal reflux disease without esophagitis: Secondary | ICD-10-CM

## 2023-07-07 DIAGNOSIS — G8929 Other chronic pain: Secondary | ICD-10-CM

## 2023-07-07 MED ORDER — AMPHETAMINE-DEXTROAMPHET ER 10 MG PO CP24: 10.0000 mg | ORAL_CAPSULE | Freq: Every day | ORAL | 0 refills | Status: DC

## 2023-07-07 MED ORDER — FENOFIBRATE 54 MG PO TABS: 54.0000 mg | ORAL_TABLET | Freq: Every day | ORAL | 3 refills | Status: AC

## 2023-07-07 MED ORDER — BUSPIRONE HCL 10 MG PO TABS: 10.0000 mg | ORAL_TABLET | Freq: Two times a day (BID) | ORAL | 1 refills | Status: DC

## 2023-07-07 MED ORDER — ESCITALOPRAM OXALATE 20 MG PO TABS: 20.0000 mg | ORAL_TABLET | Freq: Every day | ORAL | 1 refills | Status: DC

## 2023-07-07 MED ORDER — CLONAZEPAM 0.5 MG PO TABS: ORAL_TABLET | ORAL | 1 refills | Status: DC

## 2023-07-07 MED ORDER — OMEPRAZOLE 40 MG PO CPDR: 40.0000 mg | DELAYED_RELEASE_CAPSULE | Freq: Every morning | ORAL | 3 refills | Status: AC

## 2023-07-07 MED ORDER — CELECOXIB 200 MG PO CAPS: 200.0000 mg | ORAL_CAPSULE | Freq: Two times a day (BID) | ORAL | 1 refills | Status: DC

## 2023-07-07 NOTE — Progress Notes (Signed)
[facility] [address] [place], [place] [address]  Internal MEDICINE  Office Visit Note  Patient Name: [name]  [id number]  [id number]  Date of Service: 07/07/2023  Chief Complaint  Patient presents with   Hyperlipidemia   Gastroesophageal Reflux   Follow-up    Review labs    HPI [name] presents for a follow-up visit forchronic low back pain, ADHD, anxiety, and refills.  Chronic low back pain -- has been taking celebrex but feels like it is not helping very much. Taking care of husband who is a total care deficit patient. On FMLA from work to take care of him. Her back has been hurting worse lately.  ADHD -- wants to try changing to extended release dose.  Anxiety -- taking lexapro and buspirone, also has prn clonazepam.  Due for other medication refills.     Current Medication: Outpatient Encounter Medications as of 07/07/2023  Medication Sig   amphetamine-dextroamphetamine (ADDERALL XR) 10 MG 24 hr capsule Take 1 capsule (10 mg total) by mouth daily.   [START ON 08/04/2023] amphetamine-dextroamphetamine (ADDERALL XR) 10 MG 24 hr capsule Take 1 capsule (10 mg total) by mouth daily.   [START ON 09/01/2023] amphetamine-dextroamphetamine (ADDERALL XR) 10 MG 24 hr capsule Take 1 capsule (10 mg total) by mouth daily.   atorvastatin (LIPITOR) 40 MG tablet TAKE 1 TABLET BY MOUTH  DAILY AT 6 PM.   busPIRone (BUSPAR) 10 MG tablet Take 1 tablet (10 mg total) by mouth 2 (two) times daily.   celecoxib (CELEBREX) 200 MG capsule Take 1 capsule (200 mg total) by mouth 2 (two) times daily.   clotrimazole-betamethasone (LOTRISONE) cream Apply 1 Application topically 2 (two) times daily. Until resolved   diphenoxylate-atropine (LOMOTIL) 2.5-0.025 MG tablet Take 1 tablet by mouth 4 (four) times daily as needed for diarrhea or loose stools.   fluticasone (FLONASE) 50 MCG/ACT nasal spray Place 2 sprays into both nostrils daily.   ondansetron (ZOFRAN) 4 MG tablet TAKE 1 TABLET(4 MG)  BY MOUTH EVERY 8 HOURS AS NEEDED FOR NAUSEA OR VOMITING   [DISCONTINUED] amphetamine-dextroamphetamine (ADDERALL) 5 MG tablet Take 1 tablet (5 mg total) by mouth 2 (two) times daily as needed (ADHD symptoms).   [DISCONTINUED] amphetamine-dextroamphetamine (ADDERALL) 5 MG tablet Take 1 tablet (5 mg total) by mouth 2 (two) times daily as needed (ADHD symptoms).   [DISCONTINUED] amphetamine-dextroamphetamine (ADDERALL) 5 MG tablet Take 1 tablet (5 mg total) by mouth 2 (two) times daily as needed (ADHD symptoms).   [DISCONTINUED] busPIRone (BUSPAR) 5 MG tablet Take 1 tablet (5 mg total) by mouth 2 (two) times daily as needed (anxiety).   [DISCONTINUED] celecoxib (CELEBREX) 100 MG capsule TAKE 1 CAPSULE BY MOUTH TWICE  DAILY   [DISCONTINUED] clonazePAM (KLONOPIN) 0.5 MG tablet TAKE 1 TABLET(0.5 MG) BY MOUTH AT BEDTIME AS NEEDED FOR ANXIETY   [DISCONTINUED] escitalopram (LEXAPRO) 20 MG tablet TAKE 1 TABLET(20 MG) BY MOUTH DAILY   [DISCONTINUED] fenofibrate 54 MG tablet TAKE 1 TABLET BY MOUTH DAILY   [DISCONTINUED] omeprazole (PRILOSEC) 40 MG capsule TAKE 1 CAPSULE BY MOUTH IN THE  MORNING   clonazePAM (KLONOPIN) 0.5 MG tablet TAKE 1 TABLET(0.5 MG) BY MOUTH AT BEDTIME AS NEEDED FOR ANXIETY   escitalopram (LEXAPRO) 20 MG tablet Take 1 tablet (20 mg total) by mouth daily.   fenofibrate 54 MG tablet Take 1 tablet (54 mg total) by mouth daily.   omeprazole (PRILOSEC) 40 MG capsule Take 1 capsule (40 mg total) by mouth every morning.  Facility-Administered Encounter Medications as of 07/07/2023  Medication   cyanocobalamin ((VITAMIN B-12)) injection 1,000 mcg    Surgical History: Past Surgical History:  Procedure Laterality Date   CARPAL TUNNEL RELEASE Right 11/22/2019   Procedure: CARPAL TUNNEL RELEASE;  Surgeon: Deeann Saint, MD;  Location: [facility] ORS;  Service: Orthopedics;  Laterality: Right;   COLONOSCOPY     TOE SURGERY  2005,2011   ingrown toenails in doctors office   UPPER GI ENDOSCOPY       Medical History: Past Medical History:  Diagnosis Date   ADHD    Allergy    Anxiety    Colon polyp    Diverticulosis    GERD (gastroesophageal reflux disease)    Hyperlipidemia     Family History: Family History  Problem Relation Age of Onset   Lung cancer Mother    Heart disease Father    Pancreatic cancer Sister     Social History   Socioeconomic History   Marital status: Married    Spouse name: Not on file   Number of children: Not on file   Years of education: Not on file   Highest education level: Not on file  Occupational History   Not on file  Tobacco Use   Smoking status: Never   Smokeless tobacco: Never  Vaping Use   Vaping status: Never Used  Substance and Sexual Activity   Alcohol use: Yes    Comment: ocassionally    Drug use: Yes    Types: Marijuana    Comment: daily use   Sexual activity: Not on file  Other Topics Concern   Not on file  Social History Narrative   Not on file   Social Drivers of Health   Financial Resource Strain: Not on file  Food Insecurity: Not on file  Transportation Needs: Not on file  Physical Activity: Not on file  Stress: Not on file  Social Connections: Not on file  Intimate Partner Violence: Not on file      Review of Systems  Constitutional:  Negative for chills, fatigue and unexpected weight change.  HENT:  Negative for congestion, rhinorrhea, sneezing and sore throat.   Eyes:  Negative for redness.  Respiratory: Negative.  Negative for cough, chest tightness, shortness of breath and wheezing.   Cardiovascular: Negative.  Negative for chest pain and palpitations.  Gastrointestinal:  Negative for abdominal pain, constipation, diarrhea, nausea and vomiting.  Genitourinary:  Negative for dysuria and frequency.  Musculoskeletal:  Positive for arthralgias and back pain (worsening). Negative for joint swelling and neck pain.  Skin:  Negative for rash.  Neurological: Negative.  Negative for tremors and  numbness.  Hematological:  Negative for adenopathy. Does not bruise/bleed easily.  Psychiatric/Behavioral:  Positive for decreased concentration. Negative for behavioral problems (Depression), sleep disturbance and suicidal ideas. The patient is not nervous/anxious.     Vital Signs: BP 138/85   Pulse 72   Temp 98.3 F (36.8 C)   Resp 16   Ht 5\' 2"  (1.575 m)   Wt 180 lb 3.2 oz (81.7 kg)   SpO2 99%   BMI 32.96 kg/m    Physical Exam Vitals reviewed.  Constitutional:      General: She is not in acute distress.    Appearance: Normal appearance. She is not ill-appearing.  HENT:     Head: Normocephalic and atraumatic.  Eyes:     Pupils: Pupils are equal, round, and reactive to light.  Cardiovascular:     Rate and Rhythm: Normal  rate and regular rhythm.  Pulmonary:     Effort: Pulmonary effort is normal. No respiratory distress.  Neurological:     Mental Status: She is alert and oriented to person, place, and time.  Psychiatric:        Mood and Affect: Mood normal.        Behavior: Behavior normal.        Assessment/Plan: 1. Chronic bilateral low back pain without sciatica (Primary) Celebrex dose increased, take as prescribed.  - celecoxib (CELEBREX) 200 MG capsule; Take 1 capsule (200 mg total) by mouth 2 (two) times daily.  Dispense: 180 capsule; Refill: 1  2. Gastroesophageal reflux disease without esophagitis Continue omeprazole as prescribed.  - omeprazole (PRILOSEC) 40 MG capsule; Take 1 capsule (40 mg total) by mouth every morning.  Dispense: 90 capsule; Refill: 3  3. Attention and concentration deficit Patient switched to 10 mg XR adderall, take as prescribed, follow up in 3 months for additional refills. UDS due at next office visit.  - amphetamine-dextroamphetamine (ADDERALL XR) 10 MG 24 hr capsule; Take 1 capsule (10 mg total) by mouth daily.  Dispense: 30 capsule; Refill: 0 - amphetamine-dextroamphetamine (ADDERALL XR) 10 MG 24 hr capsule; Take 1 capsule (10  mg total) by mouth daily.  Dispense: 30 capsule; Refill: 0 - amphetamine-dextroamphetamine (ADDERALL XR) 10 MG 24 hr capsule; Take 1 capsule (10 mg total) by mouth daily.  Dispense: 30 capsule; Refill: 0  4. Encounter for medication review Medication list reviewed, updated and refills ordered  - fenofibrate 54 MG tablet; Take 1 tablet (54 mg total) by mouth daily.  Dispense: 90 tablet; Refill: 3 - omeprazole (PRILOSEC) 40 MG capsule; Take 1 capsule (40 mg total) by mouth every morning.  Dispense: 90 capsule; Refill: 3  5. GAD (generalized anxiety disorder) Buspirone dose increased. Continue clonazepam and lexapro as prescribed.  - busPIRone (BUSPAR) 10 MG tablet; Take 1 tablet (10 mg total) by mouth 2 (two) times daily.  Dispense: 180 tablet; Refill: 1 - clonazePAM (KLONOPIN) 0.5 MG tablet; TAKE 1 TABLET(0.5 MG) BY MOUTH AT BEDTIME AS NEEDED FOR ANXIETY  Dispense: 20 tablet; Refill: 1 - escitalopram (LEXAPRO) 20 MG tablet; Take 1 tablet (20 mg total) by mouth daily.  Dispense: 90 tablet; Refill: 1   General Counseling: [name] of the findings of todays visit and agrees with plan of treatment. I have discussed any further diagnostic evaluation that may be needed or ordered Esparza. [name]. she has been encouraged to call the office with any questions or concerns that should arise related to todays visit.    No orders of the defined types were placed in this encounter.   Meds ordered this encounter  Medications   celecoxib (CELEBREX) 200 MG capsule    Sig: Take 1 capsule (200 mg total) by mouth 2 (two) times daily.    Dispense:  180 capsule    Refill:  1    Discontinue 100 mg capsule and discontinue meloxicam.   busPIRone (BUSPAR) 10 MG tablet    Sig: Take 1 tablet (10 mg total) by mouth 2 (two) times daily.    Dispense:  180 tablet    Refill:  1    Note increased dose, discontinue 5 mg tablet. Fill new script Esparza    amphetamine-dextroamphetamine (ADDERALL XR) 10 MG 24 hr capsule    Sig: Take 1 capsule (10 mg total) by mouth daily.    Dispense:  30 capsule    Refill:  0    Switching to XR and dose increased, fill new script Esparza   amphetamine-dextroamphetamine (ADDERALL XR) 10 MG 24 hr capsule    Sig: Take 1 capsule (10 mg total) by mouth daily.    Dispense:  30 capsule    Refill:  0    Fill for march 3   amphetamine-dextroamphetamine (ADDERALL XR) 10 MG 24 hr capsule    Sig: Take 1 capsule (10 mg total) by mouth daily.    Dispense:  30 capsule    Refill:  0    Fill for march 31   clonazePAM (KLONOPIN) 0.5 MG tablet    Sig: TAKE 1 TABLET(0.5 MG) BY MOUTH AT BEDTIME AS NEEDED FOR ANXIETY    Dispense:  20 tablet    Refill:  1   escitalopram (LEXAPRO) 20 MG tablet    Sig: Take 1 tablet (20 mg total) by mouth daily.    Dispense:  90 tablet    Refill:  1   fenofibrate 54 MG tablet    Sig: Take 1 tablet (54 mg total) by mouth daily.    Dispense:  90 tablet    Refill:  3    Please send a replace/new response with 90-Day Supply if appropriate to maximize member benefit. Requesting 1 year supply.   omeprazole (PRILOSEC) 40 MG capsule    Sig: Take 1 capsule (40 mg total) by mouth every morning.    Dispense:  90 capsule    Refill:  3    Please send a replace/new response with 90-Day Supply if appropriate to maximize member benefit. Requesting 1 year supply.    Return in about 3 months (around 09/24/2023) for F/U, ADHD med check, Zacharia Sowles PCP, UDS due at next office visit. .   Total time spent:30 Minutes Time spent includes review of chart, medications, test results, and follow up plan with the patient.   [place] Controlled Substance Database was reviewed by me.  This patient was seen by Sallyanne Kuster, FNP-C in collaboration with Dr. Beverely Risen as a part of collaborative care agreement.   Garron Eline R. Tedd Sias, MSN, FNP-C Internal medicine

## 2023-07-11 ENCOUNTER — Other Ambulatory Visit: Payer: Self-pay | Admitting: Nurse Practitioner

## 2023-07-11 DIAGNOSIS — K219 Gastro-esophageal reflux disease without esophagitis: Secondary | ICD-10-CM

## 2023-07-11 DIAGNOSIS — Z79899 Other long term (current) drug therapy: Secondary | ICD-10-CM

## 2023-08-08 ENCOUNTER — Telehealth: Payer: Self-pay | Admitting: Nurse Practitioner

## 2023-08-08 NOTE — Telephone Encounter (Signed)
 Per request, [employer] EOB emailed to patient-[name]

## 2023-08-15 ENCOUNTER — Ambulatory Visit: Payer: Managed Care, Other (non HMO) | Admitting: Nurse Practitioner

## 2023-09-18 ENCOUNTER — Encounter: Payer: Self-pay | Admitting: *Deleted

## 2023-09-22 ENCOUNTER — Other Ambulatory Visit: Payer: Self-pay

## 2023-09-22 ENCOUNTER — Ambulatory Visit

## 2023-09-22 ENCOUNTER — Encounter
Admission: RE | Disposition: A | Discharge status: Home / Self Care | Payer: Self-pay | Source: Home / Self Care | Attending: Gastroenterology

## 2023-09-22 ENCOUNTER — Encounter: Payer: Self-pay | Admitting: *Deleted

## 2023-09-22 ENCOUNTER — Ambulatory Visit
Admission: RE | Admit: 2023-09-22 | Discharge: 2023-09-22 | Disposition: A | Discharge status: Home / Self Care | Payer: Managed Care, Other (non HMO) | Attending: Gastroenterology | Admitting: Gastroenterology

## 2023-09-22 DIAGNOSIS — R110 Nausea: Secondary | ICD-10-CM | POA: Insufficient documentation

## 2023-09-22 DIAGNOSIS — Z800 Family history of malignant neoplasm of digestive organs: Secondary | ICD-10-CM | POA: Insufficient documentation

## 2023-09-22 DIAGNOSIS — K5730 Diverticulosis of large intestine without perforation or abscess without bleeding: Secondary | ICD-10-CM | POA: Diagnosis not present

## 2023-09-22 DIAGNOSIS — G8929 Other chronic pain: Secondary | ICD-10-CM | POA: Diagnosis not present

## 2023-09-22 DIAGNOSIS — K2950 Unspecified chronic gastritis without bleeding: Secondary | ICD-10-CM | POA: Insufficient documentation

## 2023-09-22 DIAGNOSIS — Z79899 Other long term (current) drug therapy: Secondary | ICD-10-CM | POA: Insufficient documentation

## 2023-09-22 DIAGNOSIS — K449 Diaphragmatic hernia without obstruction or gangrene: Secondary | ICD-10-CM | POA: Insufficient documentation

## 2023-09-22 DIAGNOSIS — K5520 Angiodysplasia of colon without hemorrhage: Secondary | ICD-10-CM | POA: Diagnosis not present

## 2023-09-22 DIAGNOSIS — Z1211 Encounter for screening for malignant neoplasm of colon: Secondary | ICD-10-CM | POA: Diagnosis present

## 2023-09-22 DIAGNOSIS — D120 Benign neoplasm of cecum: Secondary | ICD-10-CM | POA: Diagnosis not present

## 2023-09-22 DIAGNOSIS — K219 Gastro-esophageal reflux disease without esophagitis: Secondary | ICD-10-CM | POA: Insufficient documentation

## 2023-09-22 DIAGNOSIS — K640 First degree hemorrhoids: Secondary | ICD-10-CM | POA: Diagnosis not present

## 2023-09-22 DIAGNOSIS — D123 Benign neoplasm of transverse colon: Secondary | ICD-10-CM | POA: Insufficient documentation

## 2023-09-22 HISTORY — PX: ESOPHAGOGASTRODUODENOSCOPY (EGD) WITH PROPOFOL: SHX5813

## 2023-09-22 HISTORY — PX: COLONOSCOPY WITH PROPOFOL: SHX5780

## 2023-09-22 SURGERY — COLONOSCOPY WITH PROPOFOL
Anesthesia: General

## 2023-09-22 MED ORDER — LIDOCAINE HCL (PF) 2 % IJ SOLN
INTRAMUSCULAR | Status: DC | PRN
  Administered 2023-09-22: 100 mg via INTRADERMAL

## 2023-09-22 MED ORDER — PROPOFOL 500 MG/50ML IV EMUL
INTRAVENOUS | Status: DC | PRN
  Administered 2023-09-22: 200 ug/kg/min via INTRAVENOUS
  Administered 2023-09-22: 50 mg via INTRAVENOUS
  Administered 2023-09-22: 100 mg via INTRAVENOUS

## 2023-09-22 MED ORDER — SODIUM CHLORIDE 0.9 % IV SOLN: INTRAVENOUS | Status: DC

## 2023-09-22 NOTE — Anesthesia Postprocedure Evaluation (Signed)
 Anesthesia Post Note  Patient: [name]  Procedure(s) Performed: COLONOSCOPY WITH PROPOFOL  ESOPHAGOGASTRODUODENOSCOPY (EGD) WITH PROPOFOL   Patient location during evaluation: Endoscopy Anesthesia Type: General Level of consciousness: awake and alert Pain management: pain level controlled Vital Signs Assessment: post-procedure vital signs reviewed and stable Respiratory status: spontaneous breathing, nonlabored ventilation, respiratory function stable and patient connected to nasal cannula oxygen Cardiovascular status: blood pressure returned to baseline and stable Postop Assessment: no apparent nausea or vomiting Anesthetic complications: no  There were no known notable events for this encounter.   Last Vitals:  Vitals:   09/22/23 1054 09/22/23 1105  BP: 128/78 (!) 140/85  Pulse: (!) 55 (!) 55  Resp: 15 13  Temp:    SpO2: 98% 100%    Last Pain:  Vitals:   09/22/23 1105  TempSrc:   PainSc: 0-No pain                 Enrique Harvest

## 2023-09-22 NOTE — Interval H&P Note (Signed)
 History and Physical Interval Note:  09/22/2023 10:18 AM  [name]  has presented today for surgery, with the diagnosis of nausea gerd h/o polyps.  The various methods of treatment have been discussed with the patient and family. After consideration of risks, benefits and other options for treatment, the patient has consented to  Procedure(s): COLONOSCOPY WITH PROPOFOL  (N/A) ESOPHAGOGASTRODUODENOSCOPY (EGD) WITH PROPOFOL  (N/A) as a surgical intervention.  The patient's history has been reviewed, patient examined, no change in status, stable for surgery.  I have reviewed the patient's chart and labs.  Questions were answered to the patient's satisfaction.     Shane Darling  Ok to proceed with EGD/Colonoscopy

## 2023-09-22 NOTE — Op Note (Signed)
 [facility] Patient Name: [name] Procedure Date: 09/22/2023 10:11 AM MRN: [id number] Account #: [id number] Date of Birth: December 03, 1955 Admit Type: Outpatient Age: 68 Room: [facility] ENDO ROOM 3 Gender: Female Note Status: Finalized Instrument Name: Charlyn Cooley [id number] Procedure:             Colonoscopy Indications:           Surveillance: Personal history of adenomatous polyps                         on last colonoscopy > 5 years ago Providers:             Leida Puna MD, MD Referring MD:          Leida Puna MD, MD (Referring MD), Laurence Pons (Referring MD) Medicines:             Monitored Anesthesia Care Complications:         No immediate complications. Estimated blood loss:                         Minimal. Procedure:             Pre-Anesthesia Assessment:                        - Prior to the procedure, a History and Physical was                         performed, and patient medications and allergies were                         reviewed. The patient is competent. The risks and                         benefits of the procedure and the sedation options and                         risks were discussed with the patient. All questions                         were answered and informed consent was obtained.                         Patient identification and proposed procedure were                         verified by the physician, the nurse, the                         anesthesiologist, the anesthetist and the technician                         in the endoscopy suite. Mental Status Examination:                         alert and oriented. Airway Examination: normal  oropharyngeal airway and neck mobility. Respiratory                         Examination: clear to auscultation. CV Examination:                         normal. Prophylactic Antibiotics: The patient does not                          require prophylactic antibiotics. Prior                         Anticoagulants: The patient has taken no anticoagulant                         or antiplatelet agents. ASA Grade Assessment: II - A                         patient with mild systemic disease. After reviewing                         the risks and benefits, the patient was deemed in                         satisfactory condition to undergo the procedure. The                         anesthesia plan was to use monitored anesthesia care                         (MAC). Immediately prior to administration of                         medications, the patient was re-assessed for adequacy                         to receive sedatives. The heart rate, respiratory                         rate, oxygen saturations, blood pressure, adequacy of                         pulmonary ventilation, and response to care were                         monitored throughout the procedure. The physical                         status of the patient was re-assessed after the                         procedure.                        After obtaining informed consent, the colonoscope was                         passed under direct vision. Throughout the procedure,  the patient's blood pressure, pulse, and oxygen                         saturations were monitored continuously. The                         Colonoscope was introduced through the anus and                         advanced to the the cecum, identified by appendiceal                         orifice and ileocecal valve. The colonoscopy was                         somewhat difficult due to a redundant colon. The                         patient tolerated the procedure well. The quality of                         the bowel preparation was good. The ileocecal valve,                         appendiceal orifice, and rectum were photographed. Findings:      The perianal and digital  rectal examinations were normal.      A 3 mm polyp was found in the cecum. The polyp was sessile. The polyp       was removed with a cold snare. Resection and retrieval were complete.       Estimated blood loss was minimal.      A single small localized angioectasia without bleeding was found in the       ascending colon.      Three sessile polyps were found in the transverse colon. The polyps were       3 to 5 mm in size. These polyps were removed with a cold snare.       Resection and retrieval were complete. Estimated blood loss was minimal.      Scattered small-mouthed diverticula were found in the sigmoid colon.      Internal hemorrhoids were found during retroflexion. The hemorrhoids       were Grade I (internal hemorrhoids that do not prolapse).      The exam was otherwise without abnormality on direct and retroflexion       views. Impression:            - One 3 mm polyp in the cecum, removed with a cold                         snare. Resected and retrieved.                        - A single non-bleeding colonic angioectasia.                        - Three 3 to 5 mm polyps in the transverse colon,                         removed  with a cold snare. Resected and retrieved.                        - Diverticulosis in the sigmoid colon.                        - Internal hemorrhoids.                        - The examination was otherwise normal on direct and                         retroflexion views. Recommendation:        - Discharge patient to home.                        - Resume previous diet.                        - Continue present medications.                        - Await pathology results.                        - Repeat colonoscopy in 3 years for surveillance.                        - Return to referring physician as previously                         scheduled. Procedure Code(s):     --- Professional ---                        [phone], Colonoscopy, flexible; with removal  of                         tumor(s), polyp(s), or other lesion(s) by snare                         technique Diagnosis Code(s):     --- Professional ---                        Z86.010, Personal history of colonic polyps                        D12.0, Benign neoplasm of cecum                        K55.20, Angiodysplasia of colon without hemorrhage                        D12.3, Benign neoplasm of transverse colon (hepatic                         flexure or splenic flexure)                        K64.0, First degree hemorrhoids                        K57.30, Diverticulosis  of large intestine without                         perforation or abscess without bleeding CPT copyright 2022 American Medical Association. All rights reserved. The codes documented in this report are preliminary and upon coder review may  be revised to meet current compliance requirements. Leida Puna MD, MD 09/22/2023 11:07:07 AM Number of Addenda: 0 Note Initiated On: 09/22/2023 10:11 AM Scope Withdrawal Time: 0 hours 9 minutes 22 seconds  Total Procedure Duration: 0 hours 16 minutes 50 seconds  Estimated Blood Loss:  Estimated blood loss was minimal.      [facility]

## 2023-09-22 NOTE — H&P (Signed)
 Outpatient short stay form Pre-procedure 09/22/2023  Shane Darling, MD  Primary Physician: Laurence Pons, NP  Reason for visit:  Nausea/History of polyps  History of present illness:    68 y/o lady with history of anxiety, chronic pain, and chronic nausea here for EGD/Colonoscopy for history of nausea and GIM and history of polyps. No blood thinners. Sister had pancreatic cancer in her early 66's. No significant abdominal surgeries.    Current Facility-Administered Medications:    0.9 %  sodium chloride  infusion, , Intravenous, Continuous, Owynn Mosqueda, Leanora Prophet, MD, Last Rate: 20 mL/hr at 09/22/23 1004, New Bag at 09/22/23 1004  Facility-Administered Medications Prior to Admission  Medication Dose Route Frequency Provider Last Rate Last Admin   cyanocobalamin  ((VITAMIN B-12)) injection 1,000 mcg  1,000 mcg Intramuscular Once Khan, Fozia M, MD       Medications Prior to Admission  Medication Sig Dispense Refill Last Dose/Taking   amphetamine -dextroamphetamine  (ADDERALL XR) 10 MG 24 hr capsule Take 1 capsule (10 mg total) by mouth daily. 30 capsule 0 09/21/2023   busPIRone  (BUSPAR ) 10 MG tablet Take 1 tablet (10 mg total) by mouth 2 (two) times daily. 180 tablet 1 09/21/2023   celecoxib  (CELEBREX ) 200 MG capsule Take 1 capsule (200 mg total) by mouth 2 (two) times daily. 180 capsule 1 09/21/2023 Morning   escitalopram  (LEXAPRO ) 20 MG tablet Take 1 tablet (20 mg total) by mouth daily. 90 tablet 1 09/21/2023   omeprazole  (PRILOSEC) 40 MG capsule Take 1 capsule (40 mg total) by mouth every morning. 90 capsule 3 09/21/2023   amphetamine -dextroamphetamine  (ADDERALL XR) 10 MG 24 hr capsule Take 1 capsule (10 mg total) by mouth daily. 30 capsule 0    amphetamine -dextroamphetamine  (ADDERALL XR) 10 MG 24 hr capsule Take 1 capsule (10 mg total) by mouth daily. 30 capsule 0    atorvastatin  (LIPITOR) 40 MG tablet TAKE 1 TABLET BY MOUTH  DAILY AT 6 PM. 90 tablet 3 09/20/2023   clonazePAM  (KLONOPIN )  0.5 MG tablet TAKE 1 TABLET(0.5 MG) BY MOUTH AT BEDTIME AS NEEDED FOR ANXIETY 20 tablet 1    clotrimazole -betamethasone  (LOTRISONE ) cream Apply 1 Application topically 2 (two) times daily. Until resolved 45 g 1    diphenoxylate -atropine  (LOMOTIL ) 2.5-0.025 MG tablet Take 1 tablet by mouth 4 (four) times daily as needed for diarrhea or loose stools. 30 tablet 0    fenofibrate  54 MG tablet Take 1 tablet (54 mg total) by mouth daily. 90 tablet 3 09/20/2023   fluticasone  (FLONASE ) 50 MCG/ACT nasal spray Place 2 sprays into both nostrils daily. 48 g 3    ondansetron  (ZOFRAN ) 4 MG tablet TAKE 1 TABLET(4 MG) BY MOUTH EVERY 8 HOURS AS NEEDED FOR NAUSEA OR VOMITING 30 tablet 3      Allergies  Allergen Reactions   Codeine Itching     Past Medical History:  Diagnosis Date   ADHD    Allergy    Anxiety    Colon polyp    Diverticulosis    GERD (gastroesophageal reflux disease)    Hyperlipidemia     Review of systems:  Otherwise negative.    Physical Exam  Gen: Alert, oriented. Appears stated age.  HEENT: PERRLA. Lungs: No respiratory distress CV: RRR Abd: soft, benign, no masses Ext: No edema   Planned procedures: Proceed with EGD/colonoscopy. The patient understands the nature of the planned procedure, indications, risks, alternatives and potential complications including but not limited to bleeding, infection, perforation, damage to internal organs and possible oversedation/side effects from  anesthesia. The patient agrees and gives consent to proceed.  Please refer to procedure notes for findings, recommendations and patient disposition/instructions.     Shane Darling, MD [facility]

## 2023-09-22 NOTE — Anesthesia Preprocedure Evaluation (Signed)
 Anesthesia Evaluation  Patient identified by MRN, date of birth, ID band Patient awake    Reviewed: Allergy & Precautions, NPO status , Patient's Chart, lab work & pertinent test results  History of Anesthesia Complications Negative for: history of anesthetic complications  Airway Mallampati: II       Dental   Pulmonary neg sleep apnea, neg COPD, Not current smoker          Cardiovascular (-) hypertension(-) Past MI and (-) CHF (-) dysrhythmias (-) Valvular Problems/Murmurs     Neuro/Psych neg Seizures  Anxiety        GI/Hepatic ,GERD  Medicated,,(+)     substance abuse  marijuana use  Endo/Other  neg diabetes    Renal/GU negative Renal ROS     Musculoskeletal   Abdominal   Peds  Hematology   Anesthesia Other Findings   Reproductive/Obstetrics                             Anesthesia Physical Anesthesia Plan  ASA: II  Anesthesia Plan: General   Post-op Pain Management: Minimal or no pain anticipated   Induction: Intravenous  PONV Risk Score and Plan: 3 and Ondansetron , Propofol  infusion and TIVA  Airway Management Planned: Natural Airway and Nasal Cannula  Additional Equipment: None  Intra-op Plan:   Post-operative Plan:   Informed Consent: I have reviewed the patients History and Physical, chart, labs and discussed the procedure including the risks, benefits and alternatives for the proposed anesthesia with the patient or authorized representative who has indicated his/her understanding and acceptance.     Dental advisory given  Plan Discussed with: CRNA and Surgeon  Anesthesia Plan Comments: (Discussed risks of anesthesia with patient, including possibility of difficulty with spontaneous ventilation under anesthesia necessitating airway intervention, PONV, and rare risks such as cardiac or respiratory or neurological events, and allergic reactions. Discussed the role of  CRNA in patient's perioperative care. Patient understands.)        Anesthesia Quick Evaluation

## 2023-09-22 NOTE — Transfer of Care (Signed)
 Immediate Anesthesia Transfer of Care Note  Patient: [name]  Procedure(s) Performed: COLONOSCOPY WITH PROPOFOL  ESOPHAGOGASTRODUODENOSCOPY (EGD) WITH PROPOFOL   Patient Location: PACU and Endoscopy Unit  Anesthesia Type:General  Level of Consciousness: sedated  Airway & Oxygen Therapy: Patient Spontanous Breathing  Post-op Assessment: Report given to RN and Post -op Vital signs reviewed and stable  Post vital signs: Reviewed and stable  Last Vitals:  Vitals Value Taken Time  BP 128/78 09/22/23 1055  Temp    Pulse 55 09/22/23 1055  Resp 17 09/22/23 1055  SpO2 98 % 09/22/23 1055  Vitals shown include unfiled device data.  Last Pain:  Vitals:   09/22/23 1054  TempSrc:   PainSc: Asleep         Complications: There were no known notable events for this encounter.

## 2023-09-22 NOTE — Op Note (Signed)
 [facility] Patient Name: [name] Procedure Date: 09/22/2023 10:12 AM MRN: [id number] Account #: [id number] Date of Birth: [birth year] Admit Type: Outpatient Age: 68 Room: [facility] ENDO ROOM 3 Gender: Female Note Status: Finalized Instrument Name: Almyra Jain [id number] Procedure:             Upper GI endoscopy Indications:           Intestinal metaplasia, Nausea Providers:             Leida Puna MD, MD Referring MD:          Leida Puna MD, MD (Referring MD), Laurence Pons (Referring MD) Medicines:             Monitored Anesthesia Care Complications:         No immediate complications. Estimated blood loss:                         Minimal. Procedure:             Pre-Anesthesia Assessment:                        - Prior to the procedure, a History and Physical was                         performed, and patient medications and allergies were                         reviewed. The patient is competent. The risks and                         benefits of the procedure and the sedation options and                         risks were discussed with the patient. All questions                         were answered and informed consent was obtained.                         Patient identification and proposed procedure were                         verified by the physician, the nurse, the                         anesthesiologist, the anesthetist and the technician                         in the endoscopy suite. Mental Status Examination:                         alert and oriented. Airway Examination: normal                         oropharyngeal airway and neck mobility. Respiratory  Examination: clear to auscultation. CV Examination:                         normal. Prophylactic Antibiotics: The patient does not                         require prophylactic antibiotics. Prior                          Anticoagulants: The patient has taken no anticoagulant                         or antiplatelet agents. ASA Grade Assessment: II - A                         patient with mild systemic disease. After reviewing                         the risks and benefits, the patient was deemed in                         satisfactory condition to undergo the procedure. The                         anesthesia plan was to use monitored anesthesia care                         (MAC). Immediately prior to administration of                         medications, the patient was re-assessed for adequacy                         to receive sedatives. The heart rate, respiratory                         rate, oxygen saturations, blood pressure, adequacy of                         pulmonary ventilation, and response to care were                         monitored throughout the procedure. The physical                         status of the patient was re-assessed after the                         procedure.                        After obtaining informed consent, the endoscope was                         passed under direct vision. Throughout the procedure,                         the patient's blood pressure, pulse, and oxygen  saturations were monitored continuously. The Endoscope                         was introduced through the mouth, and advanced to the                         second part of duodenum. The upper GI endoscopy was                         accomplished without difficulty. The patient tolerated                         the procedure well. Findings:      A small hiatal hernia was present.      The exam of the esophagus was otherwise normal.      The entire examined stomach was normal. Biopsies were taken with a cold       forceps for histology. Estimated blood loss was minimal.      The examined duodenum was normal. Impression:            - Small hiatal hernia.                         - Normal stomach. Biopsied.                        - Normal examined duodenum. Recommendation:        - Discharge patient to home.                        - Resume previous diet.                        - Continue present medications.                        - Await pathology results.                        - Return to referring physician as previously                         scheduled. Procedure Code(s):     --- Professional ---                        [phone], Esophagogastroduodenoscopy, flexible,                         transoral; with biopsy, single or multiple Diagnosis Code(s):     --- Professional ---                        K44.9, Diaphragmatic hernia without obstruction or                         gangrene                        K31.A0, Gastric intestinal metaplasia, unspecified                        R11.0, Nausea CPT copyright 2022 American  Medical Association. All rights reserved. The codes documented in this report are preliminary and upon coder review may  be revised to meet current compliance requirements. Leida Puna MD, MD 09/22/2023 11:03:06 AM Number of Addenda: 0 Note Initiated On: 09/22/2023 10:12 AM Estimated Blood Loss:  Estimated blood loss was minimal.      [facility]

## 2023-09-23 ENCOUNTER — Encounter: Payer: Self-pay | Admitting: Gastroenterology

## 2023-09-23 LAB — SURGICAL PATHOLOGY

## 2023-10-06 ENCOUNTER — Ambulatory Visit (INDEPENDENT_AMBULATORY_CARE_PROVIDER_SITE_OTHER): Payer: Managed Care, Other (non HMO) | Admitting: Nurse Practitioner

## 2023-10-06 ENCOUNTER — Encounter: Payer: Self-pay | Admitting: Nurse Practitioner

## 2023-10-06 VITALS — BP 138/82 | HR 68 | Temp 98.1°F | Resp 16 | Ht 62.0 in | Wt 180.0 lb

## 2023-10-06 DIAGNOSIS — R319 Hematuria, unspecified: Secondary | ICD-10-CM

## 2023-10-06 DIAGNOSIS — M5441 Lumbago with sciatica, right side: Secondary | ICD-10-CM

## 2023-10-06 DIAGNOSIS — R109 Unspecified abdominal pain: Secondary | ICD-10-CM | POA: Diagnosis not present

## 2023-10-06 DIAGNOSIS — K219 Gastro-esophageal reflux disease without esophagitis: Secondary | ICD-10-CM

## 2023-10-06 DIAGNOSIS — N390 Urinary tract infection, site not specified: Secondary | ICD-10-CM | POA: Diagnosis not present

## 2023-10-06 DIAGNOSIS — T3695XA Adverse effect of unspecified systemic antibiotic, initial encounter: Secondary | ICD-10-CM

## 2023-10-06 DIAGNOSIS — R41840 Attention and concentration deficit: Secondary | ICD-10-CM

## 2023-10-06 DIAGNOSIS — Z79899 Other long term (current) drug therapy: Secondary | ICD-10-CM

## 2023-10-06 DIAGNOSIS — K2950 Unspecified chronic gastritis without bleeding: Secondary | ICD-10-CM

## 2023-10-06 DIAGNOSIS — M5442 Lumbago with sciatica, left side: Secondary | ICD-10-CM

## 2023-10-06 DIAGNOSIS — B379 Candidiasis, unspecified: Secondary | ICD-10-CM

## 2023-10-06 DIAGNOSIS — G8929 Other chronic pain: Secondary | ICD-10-CM

## 2023-10-06 DIAGNOSIS — F411 Generalized anxiety disorder: Secondary | ICD-10-CM

## 2023-10-06 DIAGNOSIS — E782 Mixed hyperlipidemia: Secondary | ICD-10-CM

## 2023-10-06 LAB — POCT URINALYSIS DIPSTICK
Bilirubin, UA: NEGATIVE
Glucose, UA: NEGATIVE
Leukocytes, UA: NEGATIVE
Nitrite, UA: NEGATIVE
Protein, UA: NEGATIVE
Spec Grav, UA: 1.01 (ref 1.010–1.025)
Urobilinogen, UA: 0.2 U/dL
pH, UA: 5 (ref 5.0–8.0)

## 2023-10-06 MED ORDER — SULFAMETHOXAZOLE-TRIMETHOPRIM 800-160 MG PO TABS
1.0000 | ORAL_TABLET | Freq: Two times a day (BID) | ORAL | 0 refills | Status: AC
Start: 2023-10-06 — End: 2023-10-11

## 2023-10-06 MED ORDER — AMPHETAMINE-DEXTROAMPHET ER 10 MG PO CP24: 10.0000 mg | ORAL_CAPSULE | Freq: Every day | ORAL | 0 refills | Status: DC

## 2023-10-06 MED ORDER — SUCRALFATE 1 G PO TABS
1.0000 g | ORAL_TABLET | Freq: Three times a day (TID) | ORAL | 5 refills | Status: AC
Start: 2023-10-06 — End: ?

## 2023-10-06 MED ORDER — FLUCONAZOLE 150 MG PO TABS: 150.0000 mg | ORAL_TABLET | Freq: Once | ORAL | 0 refills | Status: AC

## 2023-10-06 MED ORDER — ROSUVASTATIN CALCIUM 10 MG PO TABS: 10.0000 mg | ORAL_TABLET | Freq: Every day | ORAL | 3 refills | Status: AC

## 2023-10-06 MED ORDER — ESCITALOPRAM OXALATE 20 MG PO TABS
20.0000 mg | ORAL_TABLET | Freq: Every day | ORAL | 1 refills | Status: DC
Start: 2023-10-06 — End: 2023-12-31

## 2023-10-06 MED ORDER — CLONAZEPAM 0.5 MG PO TABS: ORAL_TABLET | ORAL | 1 refills | Status: DC

## 2023-10-06 MED ORDER — ONDANSETRON HCL 4 MG PO TABS: 4.0000 mg | ORAL_TABLET | Freq: Three times a day (TID) | ORAL | 3 refills | Status: DC | PRN

## 2023-10-06 NOTE — Progress Notes (Unsigned)
 [facility] [address] [place], [place] [address]  Internal MEDICINE  Office Visit Note  Patient Name: [name]  [id number]  [id number]  Date of Service: 10/06/2023  Chief Complaint  Patient presents with   Gastroesophageal Reflux   Hyperlipidemia   Follow-up    HPI [name] presents for a follow-up visit for back pain, ADHD, chronic gastritis and possible UTI  Back pain -- wants to check urine to make sure she is not developing a UTI. Reports having pain radiating from the back and down her legs.  ADHD -- BP and heart rate are normal. Current dose is effective. Denies any palpitations or other adverse side effects of the medication.  Dexa scan was normal in January -- no osteoporosis or osteopenia Mammogram was normal in January this year.  Dysuria -- right flank pain, back pain worse than normal, burning with urination, urinary discomfort. Urinalysis is positive for moderate blood.    Current Medication: Outpatient Encounter Medications as of 10/06/2023  Medication Sig   busPIRone  (BUSPAR ) 10 MG tablet Take 1 tablet (10 mg total) by mouth 2 (two) times daily.   celecoxib  (CELEBREX ) 200 MG capsule Take 1 capsule (200 mg total) by mouth 2 (two) times daily.   clotrimazole -betamethasone  (LOTRISONE ) cream Apply 1 Application topically 2 (two) times daily. Until resolved   diphenoxylate -atropine  (LOMOTIL ) 2.5-0.025 MG tablet Take 1 tablet by mouth 4 (four) times daily as needed for diarrhea or loose stools.   fenofibrate  54 MG tablet Take 1 tablet (54 mg total) by mouth daily.   fluconazole  (DIFLUCAN ) 150 MG tablet Take 1 tablet (150 mg total) by mouth once for 1 dose. May take an additional dose after 3 days if still symptomatic.   fluticasone  (FLONASE ) 50 MCG/ACT nasal spray Place 2 sprays into both nostrils daily.   omeprazole  (PRILOSEC) 40 MG capsule Take 1 capsule (40 mg total) by mouth every morning.   rosuvastatin (CRESTOR) 10 MG tablet Take 1 tablet (10 mg  total) by mouth daily.   sucralfate (CARAFATE) 1 g tablet Take 1 tablet (1 g total) by mouth 4 (four) times daily -  with meals and at bedtime.   sulfamethoxazole -trimethoprim  (BACTRIM  DS) 800-160 MG tablet Take 1 tablet by mouth 2 (two) times daily for 5 days. Take with food   [DISCONTINUED] amphetamine -dextroamphetamine  (ADDERALL XR) 10 MG 24 hr capsule Take 1 capsule (10 mg total) by mouth daily.   [DISCONTINUED] amphetamine -dextroamphetamine  (ADDERALL XR) 10 MG 24 hr capsule Take 1 capsule (10 mg total) by mouth daily.   [DISCONTINUED] amphetamine -dextroamphetamine  (ADDERALL XR) 10 MG 24 hr capsule Take 1 capsule (10 mg total) by mouth daily.   [DISCONTINUED] atorvastatin  (LIPITOR) 40 MG tablet TAKE 1 TABLET BY MOUTH  DAILY AT 6 PM.   [DISCONTINUED] clonazePAM  (KLONOPIN ) 0.5 MG tablet TAKE 1 TABLET(0.5 MG) BY MOUTH AT BEDTIME AS NEEDED FOR ANXIETY   [DISCONTINUED] escitalopram  (LEXAPRO ) 20 MG tablet Take 1 tablet (20 mg total) by mouth daily.   [DISCONTINUED] ondansetron  (ZOFRAN ) 4 MG tablet TAKE 1 TABLET(4 MG) BY MOUTH EVERY 8 HOURS AS NEEDED FOR NAUSEA OR VOMITING   amphetamine -dextroamphetamine  (ADDERALL XR) 10 MG 24 hr capsule Take 1 capsule (10 mg total) by mouth daily.   [START ON 11/03/2023] amphetamine -dextroamphetamine  (ADDERALL XR) 10 MG 24 hr capsule Take 1 capsule (10 mg total) by mouth daily.   [START ON 12/01/2023] amphetamine -dextroamphetamine  (ADDERALL XR) 10 MG 24 hr capsule Take 1 capsule (10 mg total) by mouth daily.   clonazePAM  (KLONOPIN ) 0.5  MG tablet TAKE 1 TABLET(0.5 MG) BY MOUTH AT BEDTIME AS NEEDED FOR ANXIETY   escitalopram  (LEXAPRO ) 20 MG tablet Take 1 tablet (20 mg total) by mouth daily.   ondansetron  (ZOFRAN ) 4 MG tablet Take 1 tablet (4 mg total) by mouth every 8 (eight) hours as needed for nausea or vomiting.   Facility-Administered Encounter Medications as of 10/06/2023  Medication   cyanocobalamin  ((VITAMIN B-12)) injection 1,000 mcg    Surgical History: Past  Surgical History:  Procedure Laterality Date   CARPAL TUNNEL RELEASE Right 11/22/2019   Procedure: CARPAL TUNNEL RELEASE;  Surgeon: Marlynn Singer, MD;  Location: [facility] ORS;  Service: Orthopedics;  Laterality: Right;   COLONOSCOPY     COLONOSCOPY WITH PROPOFOL  N/A 09/22/2023   Procedure: COLONOSCOPY WITH PROPOFOL ;  Surgeon: Shane Darling, MD;  Location: [facility] ENDOSCOPY;  Service: Endoscopy;  Laterality: N/A;   ESOPHAGOGASTRODUODENOSCOPY (EGD) WITH PROPOFOL  N/A 09/22/2023   Procedure: ESOPHAGOGASTRODUODENOSCOPY (EGD) WITH PROPOFOL ;  Surgeon: Shane Darling, MD;  Location: [facility] ENDOSCOPY;  Service: Endoscopy;  Laterality: N/A;   TOE SURGERY  2005,2011   ingrown toenails in doctors office   UPPER GI ENDOSCOPY      Medical History: Past Medical History:  Diagnosis Date   ADHD    Allergy    Anxiety    Colon polyp    Diverticulosis    GERD (gastroesophageal reflux disease)    Hyperlipidemia     Family History: Family History  Problem Relation Age of Onset   Lung cancer Mother    Heart disease Father    Pancreatic cancer Sister     Social History   Socioeconomic History   Marital status: Married    Spouse name: Not on file   Number of children: Not on file   Years of education: Not on file   Highest education level: Not on file  Occupational History   Not on file  Tobacco Use   Smoking status: Never   Smokeless tobacco: Never  Vaping Use   Vaping status: Never Used  Substance and Sexual Activity   Alcohol use: Yes    Comment: ocassionally    Drug use: Yes    Types: Marijuana    Comment: daily use   Sexual activity: Not on file  Other Topics Concern   Not on file  Social History Narrative   Not on file   Social Drivers of Health   Financial Resource Strain: Not on file  Food Insecurity: Not on file  Transportation Needs: Not on file  Physical Activity: Not on file  Stress: Not on file  Social Connections: Not on file  Intimate Partner Violence: Not  on file      Review of Systems  Constitutional:  Negative for chills, fatigue and unexpected weight change.  HENT:  Negative for congestion, rhinorrhea, sneezing and sore throat.   Eyes:  Negative for redness.  Respiratory: Negative.  Negative for cough, chest tightness, shortness of breath and wheezing.   Cardiovascular: Negative.  Negative for chest pain and palpitations.  Gastrointestinal:  Negative for abdominal pain, constipation, diarrhea, nausea and vomiting.  Genitourinary:  Positive for dysuria, flank pain and hematuria. Negative for frequency.  Musculoskeletal:  Positive for arthralgias and back pain (worsening). Negative for joint swelling and neck pain.  Skin:  Negative for rash.  Neurological: Negative.  Negative for tremors and numbness.  Hematological:  Negative for adenopathy. Does not bruise/bleed easily.  Psychiatric/Behavioral:  Positive for decreased concentration. Negative for behavioral problems (Depression), sleep disturbance  and suicidal ideas. The patient is not nervous/anxious.     Vital Signs: BP 138/82 Comment: 140/84  Pulse 68   Temp 98.1 F (36.7 C)   Resp 16   Ht 5\' 2"  (1.575 m)   Wt 180 lb (81.6 kg)   SpO2 97%   BMI 32.92 kg/m    Physical Exam Vitals reviewed.  Constitutional:      General: She is not in acute distress.    Appearance: Normal appearance. She is not ill-appearing.  HENT:     Head: Normocephalic and atraumatic.  Eyes:     Pupils: Pupils are equal, round, and reactive to light.  Cardiovascular:     Rate and Rhythm: Normal rate and regular rhythm.  Pulmonary:     Effort: Pulmonary effort is normal. No respiratory distress.  Neurological:     Mental Status: She is alert and oriented to person, place, and time.  Psychiatric:        Mood and Affect: Mood normal.        Behavior: Behavior normal.        Assessment/Plan: 1. Acute right flank pain (Primary) Urinalysis showed moderate blood, urine culture sent.  - POCT  Urinalysis Dipstick  2. Chronic bilateral low back pain with bilateral sciatica Xray of lumbar spine ordered. Will consider orthopedic surgery or neurosurgery referral pending results.  - DG Lumbar Spine Complete; Future  3. Other chronic gastritis without hemorrhage Will try sucralfate to help relieve symptoms.  - sucralfate (CARAFATE) 1 g tablet; Take 1 tablet (1 g total) by mouth 4 (four) times daily -  with meals and at bedtime.  Dispense: 120 tablet; Refill: 5  4. Urinary tract infection with hematuria, site unspecified Urine culture sent to lab. Bactrim  prescribed, take until gone.  - sulfamethoxazole -trimethoprim  (BACTRIM  DS) 800-160 MG tablet; Take 1 tablet by mouth 2 (two) times daily for 5 days. Take with food  Dispense: 10 tablet; Refill: 0 - CULTURE, URINE COMPREHENSIVE  5. Mixed hyperlipidemia Continue rosuvastatin as prescribed.  - rosuvastatin (CRESTOR) 10 MG tablet; Take 1 tablet (10 mg total) by mouth daily.  Dispense: 90 tablet; Refill: 3  6. Gastroesophageal reflux disease without esophagitis Continue zofran  and omeprazole  as prescribed.  - ondansetron  (ZOFRAN ) 4 MG tablet; Take 1 tablet (4 mg total) by mouth every 8 (eight) hours as needed for nausea or vomiting.  Dispense: 30 tablet; Refill: 3  7. Antibiotic-induced yeast infection Fluconazole  prescribed just in case  - fluconazole  (DIFLUCAN ) 150 MG tablet; Take 1 tablet (150 mg total) by mouth once for 1 dose. May take an additional dose after 3 days if still symptomatic.  Dispense: 3 tablet; Refill: 0  8. Attention and concentration deficit Continue adderall as prescribed. Follow up in 3 months for additional refills. UDS due at next office visit.  - amphetamine -dextroamphetamine  (ADDERALL XR) 10 MG 24 hr capsule; Take 1 capsule (10 mg total) by mouth daily.  Dispense: 30 capsule; Refill: 0 - amphetamine -dextroamphetamine  (ADDERALL XR) 10 MG 24 hr capsule; Take 1 capsule (10 mg total) by mouth daily.  Dispense: 30  capsule; Refill: 0 - amphetamine -dextroamphetamine  (ADDERALL XR) 10 MG 24 hr capsule; Take 1 capsule (10 mg total) by mouth daily.  Dispense: 30 capsule; Refill: 0  9. GAD (generalized anxiety disorder) Continue escitalopram  and prn clonazepam  as prescribed. Follow up in 3 months for clonazepam  refills.  - clonazePAM  (KLONOPIN ) 0.5 MG tablet; TAKE 1 TABLET(0.5 MG) BY MOUTH AT BEDTIME AS NEEDED FOR ANXIETY  Dispense: 20 tablet; Refill:  1 - escitalopram  (LEXAPRO ) 20 MG tablet; Take 1 tablet (20 mg total) by mouth daily.  Dispense: 90 tablet; Refill: 1  10. Encounter for medication review Medication list reviewed, updated and refills ordered  - ondansetron  (ZOFRAN ) 4 MG tablet; Take 1 tablet (4 mg total) by mouth every 8 (eight) hours as needed for nausea or vomiting.  Dispense: 30 tablet; Refill: 3   General Counseling: [name] verbalizes understanding of the findings of todays visit and agrees with plan of treatment. I have discussed any further diagnostic evaluation that may be needed or ordered today. We also reviewed her medications today. she has been encouraged to call the office with any questions or concerns that should arise related to todays visit.    Orders Placed This Encounter  Procedures   CULTURE, URINE COMPREHENSIVE   DG Lumbar Spine Complete   POCT Urinalysis Dipstick    Meds ordered this encounter  Medications   amphetamine -dextroamphetamine  (ADDERALL XR) 10 MG 24 hr capsule    Sig: Take 1 capsule (10 mg total) by mouth daily.    Dispense:  30 capsule    Refill:  0    Fill for may 5   amphetamine -dextroamphetamine  (ADDERALL XR) 10 MG 24 hr capsule    Sig: Take 1 capsule (10 mg total) by mouth daily.    Dispense:  30 capsule    Refill:  0    Fill for June 2   amphetamine -dextroamphetamine  (ADDERALL XR) 10 MG 24 hr capsule    Sig: Take 1 capsule (10 mg total) by mouth daily.    Dispense:  30 capsule    Refill:  0    Fill for June 30   clonazePAM  (KLONOPIN ) 0.5 MG  tablet    Sig: TAKE 1 TABLET(0.5 MG) BY MOUTH AT BEDTIME AS NEEDED FOR ANXIETY    Dispense:  20 tablet    Refill:  1   ondansetron  (ZOFRAN ) 4 MG tablet    Sig: Take 1 tablet (4 mg total) by mouth every 8 (eight) hours as needed for nausea or vomiting.    Dispense:  30 tablet    Refill:  3   escitalopram  (LEXAPRO ) 20 MG tablet    Sig: Take 1 tablet (20 mg total) by mouth daily.    Dispense:  90 tablet    Refill:  1   sucralfate (CARAFATE) 1 g tablet    Sig: Take 1 tablet (1 g total) by mouth 4 (four) times daily -  with meals and at bedtime.    Dispense:  120 tablet    Refill:  5    Fill new script today   rosuvastatin (CRESTOR) 10 MG tablet    Sig: Take 1 tablet (10 mg total) by mouth daily.    Dispense:  90 tablet    Refill:  3    Discontinue atorvastatin  and fill new script today   sulfamethoxazole -trimethoprim  (BACTRIM  DS) 800-160 MG tablet    Sig: Take 1 tablet by mouth 2 (two) times daily for 5 days. Take with food    Dispense:  10 tablet    Refill:  0    Fill new script today   fluconazole  (DIFLUCAN ) 150 MG tablet    Sig: Take 1 tablet (150 mg total) by mouth once for 1 dose. May take an additional dose after 3 days if still symptomatic.    Dispense:  3 tablet    Refill:  0    Return in about 3 months (around 12/31/2023) for F/U, ADHD  med check, [name] PCP, will call with xray results for future consult .   Total time spent:30 Minutes Time spent includes review of chart, medications, test results, and follow up plan with the patient.   [place] Controlled Substance Database was reviewed by me.  This patient was seen by Sabrina Pons, FNP-C in collaboration with Dr. Verneta Gone as a part of collaborative care agreement.   Sabrina Schimek R. Bobbi Burow, MSN, FNP-C Internal medicine

## 2023-10-07 ENCOUNTER — Encounter: Payer: Self-pay | Admitting: Nurse Practitioner

## 2023-10-10 LAB — CULTURE, URINE COMPREHENSIVE

## 2023-10-16 DIAGNOSIS — M79672 Pain in left foot: Secondary | ICD-10-CM | POA: Diagnosis not present

## 2023-10-16 DIAGNOSIS — M25572 Pain in left ankle and joints of left foot: Secondary | ICD-10-CM | POA: Diagnosis not present

## 2023-10-16 DIAGNOSIS — M25472 Effusion, left ankle: Secondary | ICD-10-CM | POA: Diagnosis not present

## 2023-10-24 ENCOUNTER — Ambulatory Visit
Admission: RE | Admit: 2023-10-24 | Discharge: 2023-10-24 | Disposition: A | Discharge status: Home / Self Care | Source: Ambulatory Visit | Attending: Nurse Practitioner | Admitting: Nurse Practitioner

## 2023-10-24 DIAGNOSIS — M47816 Spondylosis without myelopathy or radiculopathy, lumbar region: Secondary | ICD-10-CM | POA: Diagnosis not present

## 2023-10-24 DIAGNOSIS — G8929 Other chronic pain: Secondary | ICD-10-CM

## 2023-10-24 DIAGNOSIS — M4316 Spondylolisthesis, lumbar region: Secondary | ICD-10-CM | POA: Diagnosis not present

## 2023-10-27 DIAGNOSIS — M25572 Pain in left ankle and joints of left foot: Secondary | ICD-10-CM | POA: Diagnosis not present

## 2023-11-03 DIAGNOSIS — S86312D Strain of muscle(s) and tendon(s) of peroneal muscle group at lower leg level, left leg, subsequent encounter: Secondary | ICD-10-CM | POA: Diagnosis not present

## 2023-11-07 NOTE — Progress Notes (Unsigned)
 [facility] [address] [place], [place] [address]  Pulmonary Sleep Medicine   Office Visit Note  Patient Name: [name] DOB: [birth year] MRN [id number]    Chief Complaint: Obstructive Sleep Apnea visit  Brief History:  [name] is seen today for an annual follow up visit for APAP@ 4-20 cmH2O. The patient has a 3 year history of sleep apnea. Patient is using PAP nightly.  The patient feels rested after sleeping with PAP.  The patient reports benefiting from PAP use. Reported sleepiness is  improved and the Epworth Sleepiness Score is 2 out of 24. The patient will rarely take naps. The patient complains of the following: none.  The compliance download shows 95% compliance with an average use time of 7 hours 42 minutes. The AHI is 2.1.  The patient does not complain of limb movements disrupting sleep. The patient continues to require PAP therapy in order to eliminate sleep apnea.   ROS  General: (-) fever, (-) chills, (-) night sweat Nose and Sinuses: (-) nasal stuffiness or itchiness, (-) postnasal drip, (-) nosebleeds, (-) sinus trouble. Mouth and Throat: (-) sore throat, (-) hoarseness. Neck: (-) swollen glands, (-) enlarged thyroid, (-) neck pain. Respiratory: - cough, - shortness of breath, - wheezing. Neurologic: - numbness, - tingling. Psychiatric: + anxiety, + depression   Current Medication: Outpatient Encounter Medications as of 11/10/2023  Medication Sig   amphetamine -dextroamphetamine  (ADDERALL XR) 10 MG 24 hr capsule Take 1 capsule (10 mg total) by mouth daily.   amphetamine -dextroamphetamine  (ADDERALL XR) 10 MG 24 hr capsule Take 1 capsule (10 mg total) by mouth daily.   [START ON 12/01/2023] amphetamine -dextroamphetamine  (ADDERALL XR) 10 MG 24 hr capsule Take 1 capsule (10 mg total) by mouth daily.   busPIRone  (BUSPAR ) 10 MG tablet Take 1 tablet (10 mg total) by mouth 2 (two) times daily.   celecoxib  (CELEBREX ) 200 MG capsule Take 1 capsule (200 mg  total) by mouth 2 (two) times daily.   clonazePAM  (KLONOPIN ) 0.5 MG tablet TAKE 1 TABLET(0.5 MG) BY MOUTH AT BEDTIME AS NEEDED FOR ANXIETY   clotrimazole -betamethasone  (LOTRISONE ) cream Apply 1 Application topically 2 (two) times daily. Until resolved   diphenoxylate -atropine  (LOMOTIL ) 2.5-0.025 MG tablet Take 1 tablet by mouth 4 (four) times daily as needed for diarrhea or loose stools.   escitalopram  (LEXAPRO ) 20 MG tablet Take 1 tablet (20 mg total) by mouth daily.   fenofibrate  54 MG tablet Take 1 tablet (54 mg total) by mouth daily.   fluticasone  (FLONASE ) 50 MCG/ACT nasal spray Place 2 sprays into both nostrils daily.   omeprazole  (PRILOSEC) 40 MG capsule Take 1 capsule (40 mg total) by mouth every morning.   ondansetron  (ZOFRAN ) 4 MG tablet Take 1 tablet (4 mg total) by mouth every 8 (eight) hours as needed for nausea or vomiting.   rosuvastatin  (CRESTOR ) 10 MG tablet Take 1 tablet (10 mg total) by mouth daily.   sucralfate  (CARAFATE ) 1 g tablet Take 1 tablet (1 g total) by mouth 4 (four) times daily -  with meals and at bedtime.   Facility-Administered Encounter Medications as of 11/10/2023  Medication   cyanocobalamin  ((VITAMIN B-12)) injection 1,000 mcg    Surgical History: Past Surgical History:  Procedure Laterality Date   CARPAL TUNNEL RELEASE Right 11/22/2019   Procedure: CARPAL TUNNEL RELEASE;  Surgeon: Marlynn Singer, MD;  Location: [facility] ORS;  Service: Orthopedics;  Laterality: Right;   COLONOSCOPY     COLONOSCOPY WITH PROPOFOL  N/A 09/22/2023   Procedure: COLONOSCOPY WITH  PROPOFOL ;  Surgeon: Shane Darling, MD;  Location: [facility] ENDOSCOPY;  Service: Endoscopy;  Laterality: N/A;   ESOPHAGOGASTRODUODENOSCOPY (EGD) WITH PROPOFOL  N/A 09/22/2023   Procedure: ESOPHAGOGASTRODUODENOSCOPY (EGD) WITH PROPOFOL ;  Surgeon: Shane Darling, MD;  Location: [facility] ENDOSCOPY;  Service: Endoscopy;  Laterality: N/A;   TOE SURGERY  2005,2011   ingrown toenails in doctors office   UPPER GI  ENDOSCOPY      Medical History: Past Medical History:  Diagnosis Date   ADHD    Allergy    Anxiety    Colon polyp    Diverticulosis    GERD (gastroesophageal reflux disease)    Hyperlipidemia     Family History: Non contributory to the present illness  Social History: Social History   Socioeconomic History   Marital status: Married    Spouse name: Not on file   Number of children: Not on file   Years of education: Not on file   Highest education level: Not on file  Occupational History   Not on file  Tobacco Use   Smoking status: Never   Smokeless tobacco: Never  Vaping Use   Vaping status: Never Used  Substance and Sexual Activity   Alcohol use: Yes    Comment: ocassionally    Drug use: Yes    Types: Marijuana    Comment: daily use   Sexual activity: Not on file  Other Topics Concern   Not on file  Social History Narrative   Not on file   Social Drivers of Health   Financial Resource Strain: Not on file  Food Insecurity: Not on file  Transportation Needs: Not on file  Physical Activity: Not on file  Stress: Not on file  Social Connections: Not on file  Intimate Partner Violence: Not on file    Vital Signs: Blood pressure 138/83, pulse 78, resp. rate 16, height 5\' 1"  (1.549 m), weight 183 lb (83 kg), SpO2 97%. Body mass index is 34.58 kg/m.    Examination: General Appearance: The patient is well-developed, well-nourished, and in no distress. Neck Circumference: 40 cm Skin: Gross inspection of skin unremarkable. Head: normocephalic, no gross deformities. Eyes: no gross deformities noted. ENT: ears appear grossly normal Neurologic: Alert and oriented. No involuntary movements.  STOP BANG RISK ASSESSMENT S (snore) Have you been told that you snore?     NO   T (tired) Are you often tired, fatigued, or sleepy during the day?   NO  O (obstruction) Do you stop breathing, choke, or gasp during sleep? NO   P (pressure) Do you have or are you being  treated for high blood pressure? NO   B (BMI) Is your body index greater than 35 kg/m? NO   A (age) Are you 83 years old or older? YES   N (neck) Do you have a neck circumference greater than 16 inches?   YES   G (gender) Are you a female? NO   TOTAL STOP/BANG "YES" ANSWERS 2       A STOP-Bang score of 2 or less is considered low risk, and a score of 5 or more is high risk for having either moderate or severe OSA. For people who score 3 or 4, doctors may need to perform further assessment to determine how likely they are to have OSA.         EPWORTH SLEEPINESS SCALE:  Scale:  (0)= no chance of dozing; (1)= slight chance of dozing; (2)= moderate chance of dozing; (3)= high chance of dozing  Chance  Situtation    Sitting and reading: 0    Watching TV: 1    Sitting Inactive in public: 0    As a passenger in car: 0      Lying down to rest: 1    Sitting and talking: 0    Sitting quielty after lunch: 0    In a car, stopped in traffic: 0   TOTAL SCORE:   2 out of 24    SLEEP STUDIES:  PSG (06/27/20) AHI 20.9, REM AHI 67.6, min SPO2 79%    CPAP COMPLIANCE DATA:  Date Range: 11/07/2022-11/06/2023  Average Daily Use: 7 hours 42 minutes  Median Use: 7 hours 33 minutes  Compliance for > 4 Hours: 95%  AHI: 2.1 respiratory events per hour  Days Used: 351/365 days  Mask Leak: 23.5  95th Percentile Pressure: 12.4         LABS: Recent Results (from the past 2160 hours)  Surgical pathology     Status: None   Collection Time: 09/22/23 12:00 AM  Result Value Ref Range   SURGICAL PATHOLOGY      SURGICAL PATHOLOGY [facility] [address], [address] [place], [place] [address] Telephone [phone] or [phone] Fax [phone]  REPORT OF SURGICAL PATHOLOGY   Accession #: [id number] Patient Name: MONSERRAT, VIDAURRI Visit # : [id number]  MRN: [id number] Physician: Leida Puna DOB/Age [birth year] (Age: 79) Gender:  F Collected Date: 09/22/2023 Received Date: 09/22/2023  FINAL DIAGNOSIS       1. Stomach- Antrum, cbx :       REACTIVE GASTROPATHY WITH MINIMAL CHRONIC GASTRITIS      NEGATIVE FOR H. PYLORI, INTESTINAL METAPLASIA, DYSPLASIA AND CARCINOMA       2. Stomach- Body, cbx :       REACTIVE GASTROPATHY WITH MINIMAL CHRONIC GASTRITIS      NEGATIVE FOR H. PYLORI, INTESTINAL METAPLASIA, DYSPLASIA AND CARCINOMA       3. Transverse Colon Polyp, X3 cold snare :       TUBULAR ADENOMA, 4 FRAGMENTS      NEGATIVE FOR HIGH-GRADE DYSPLASIA AND CARCINOMA       4. Cecum Polyp, cold snare :       TUBULAR ADENOMA      NEGATIVE FOR HIG H-GRADE DYSPLASIA AND CARCINOMA       ELECTRONIC SIGNATURE : Picklesimer Md, Fred , Sports administrator, International aid/development worker  MICROSCOPIC DESCRIPTION  CASE COMMENTS STAINS USED IN DIAGNOSIS: H&E H&E H&E H&E    CLINICAL HISTORY  SPECIMEN(S) OBTAINED 1. Stomach- Antrum, Cbx 2. Stomach- Body, Cbx 3. Transverse Colon Polyp, X3 Cold Snare 4. Cecum Polyp, Cold Snare  SPECIMEN COMMENTS: 1. R/O intestinal metaplasia 2. R/O intestinal metaplasia SPECIMEN CLINICAL INFORMATION: 1. Nausea, gerd, H/O polyps.Small hiatal hernia.    Gross Description 1. "CBX antrum stomach R/O intestinal metaplasia", received in formalin is a 1.4 x 0.8 x 0.1 cm aggregate of multiple tan-pink tissue fragments.The specimen is submitted in toto in 1 block (1A). 2. "CBX body stomach R/O intestinal metaplasia", received in formalin is a 1.0 x 0.6 x 0.1 cm aggregate of multiple tan tissue fragments.The specimen is submitted in toto in 1 block (2A). 3. "Cold snare polyp x3 transverse colo n", received in formalin is a 1.5 x 0.8 x 0.1 cm aggregate of multiple tan-white tissue fragments.The specimen is submitted in toto in 1 block (3A). 4. "Cold snare polyp cecum colon", received in formalin is a 2.0 x 0.6 x 0.1 cm aggregate of  multiple tan-white tissue fragments.The specimen is submitted  in toto in 1 block (4A).      AMG 09/22/2023        Report signed out from the following location(s) [facility] [address] [place], [place] [address] CLIA #: [id number]  [facility] [address] [place], [place] [address] CLIA #: [id number]   POCT Urinalysis Dipstick     Status: None   Collection Time: 10/06/23  1:42 PM  Result Value Ref Range   Color, UA     Clarity, UA     Glucose, UA Negative Negative   Bilirubin, UA negative    Ketones, UA trace    Spec Grav, UA 1.010 1.010 - 1.025   Blood, UA moderate    pH, UA 5.0 5.0 - 8.0   Protein, UA Negative Negative   Urobilinogen, UA 0.2 0.2 or 1.0 E.U./dL   Nitrite, UA negative    Leukocytes, UA Negative Negative   Appearance     Odor    CULTURE, URINE COMPREHENSIVE     Status: None   Collection Time: 10/06/23  3:34 PM   Specimen: Urine   Urine  Result Value Ref Range   Urine Culture, Comprehensive Final report    Organism ID, Bacteria Comment     Comment: Mixed urogenital flora 25,000-50,000 colony forming units per mL     Radiology: DG Lumbar Spine Complete Result Date: 10/24/2023 CLINICAL DATA:  Chronic low back pain, bilateral sciatica, right flank pain EXAM: LUMBAR SPINE - COMPLETE 4+ VIEW COMPARISON:  11/05/2010 FINDINGS: Frontal, bilateral oblique, and lateral views of the lumbar spine are obtained. There are 5 non-rib-bearing lumbar type vertebral bodies identified, with grade 1 degenerative anterolisthesis of L5 on S1. No evidence of pars defects. There are no acute bony abnormalities. There is mild spondylosis at the thoracolumbar junction. More moderate spondylosis and facet hypertrophy are seen within the lumbar spine at L2-3, L4-5, and L5-S1. Limbus vertebra at the anterior inferior margin of the L2 vertebral body incidentally noted. Sacroiliac joints are unremarkable. IMPRESSION: 1. Progressive multilevel thoracolumbar spondylosis as above. 2. Grade 1 degenerative  anterolisthesis of L5 on S1. 3. No acute fracture. Electronically Signed   By: Bobbye Burrow M.D.   On: 10/24/2023 16:58    No results found.  DG Lumbar Spine Complete Result Date: 10/24/2023 CLINICAL DATA:  Chronic low back pain, bilateral sciatica, right flank pain EXAM: LUMBAR SPINE - COMPLETE 4+ VIEW COMPARISON:  11/05/2010 FINDINGS: Frontal, bilateral oblique, and lateral views of the lumbar spine are obtained. There are 5 non-rib-bearing lumbar type vertebral bodies identified, with grade 1 degenerative anterolisthesis of L5 on S1. No evidence of pars defects. There are no acute bony abnormalities. There is mild spondylosis at the thoracolumbar junction. More moderate spondylosis and facet hypertrophy are seen within the lumbar spine at L2-3, L4-5, and L5-S1. Limbus vertebra at the anterior inferior margin of the L2 vertebral body incidentally noted. Sacroiliac joints are unremarkable. IMPRESSION: 1. Progressive multilevel thoracolumbar spondylosis as above. 2. Grade 1 degenerative anterolisthesis of L5 on S1. 3. No acute fracture. Electronically Signed   By: Bobbye Burrow M.D.   On: 10/24/2023 16:58      Assessment and Plan: Patient Active Problem List   Diagnosis Date Noted   OSA on CPAP 11/20/2020   CPAP use counseling 11/20/2020   Overweight (BMI 25.0-29.9) 11/20/2020   Excessive daytime sleepiness 06/21/2020   Loud snoring 06/21/2020   BMI 32.0-32.9,adult 03/24/2020  Needs flu shot 03/24/2020   Vasomotor rhinitis 08/22/2019   Encounter for general adult medical examination with abnormal findings 05/16/2019   Encounter for long-term (current) use of medications 04/17/2019   Screening for breast cancer 02/09/2018   Routine cervical smear 02/09/2018   Gastroesophageal reflux disease without esophagitis 11/12/2017   Urinary tract infection with hematuria 07/30/2017   Acute cystitis with hematuria 07/30/2017   Chronic bilateral low back pain without sciatica 07/30/2017    Dysuria 07/30/2017   Mixed hyperlipidemia 07/30/2017   Attention and concentration deficit 07/30/2017   GAD (generalized anxiety disorder) 07/30/2017   Other fatigue 07/30/2017   1. OSA on CPAP (Primary) The patient does tolerate PAP and reports  benefit from PAP use. The patient was reminded how to clean equipment and advised to replace supplies routinely. The patient was also counselled on weight loss. The compliance is excellent. The AHI is 2.1.   OSA on cpap- controlled. Continue with excellent compliance with pap. CPAP continues to be medically necessary to treat this patient's OSA. F/u one year.    2. CPAP use counseling CPAP Counseling: had a lengthy discussion with the patient regarding the importance of PAP therapy in management of the sleep apnea. Patient appears to understand the risk factor reduction and also understands the risks associated with untreated sleep apnea. Patient will try to make a good faith effort to remain compliant with therapy. Also instructed the patient on proper cleaning of the device including the water must be changed daily if possible and use of distilled water is preferred. Patient understands that the machine should be regularly cleaned with appropriate recommended cleaning solutions that do not damage the PAP machine for example given white vinegar and water rinses. Other methods such as ozone treatment may not be as good as these simple methods to achieve cleaning.      General Counseling: I have discussed the findings of the evaluation and examination with Juanda.  I have also discussed any further diagnostic evaluation thatmay be needed or ordered today. Taylorann verbalizes understanding of the findings of todays visit. We also reviewed her medications today and discussed drug interactions and side effects including but not limited excessive drowsiness and altered mental states. We also discussed that there is always a risk not just to her but also people  around her. she has been encouraged to call the office with any questions or concerns that should arise related to todays visit.  No orders of the defined types were placed in this encounter.       I have personally obtained a history, examined the patient, evaluated laboratory and imaging results, formulated the assessment and plan and placed orders. This patient was seen today by Louann Rous, PA-C in collaboration with Dr. Cam Cava.   Cordie Deters, MD [facility] Diplomate ABMS Pulmonary Critical Care Medicine and Sleep Medicine

## 2023-11-10 ENCOUNTER — Ambulatory Visit (INDEPENDENT_AMBULATORY_CARE_PROVIDER_SITE_OTHER): Payer: Self-pay | Admitting: Internal Medicine

## 2023-11-10 VITALS — BP 138/83 | HR 78 | Resp 16 | Ht 61.0 in | Wt 183.0 lb

## 2023-11-10 DIAGNOSIS — G4733 Obstructive sleep apnea (adult) (pediatric): Secondary | ICD-10-CM

## 2023-11-10 DIAGNOSIS — Z7189 Other specified counseling: Secondary | ICD-10-CM

## 2023-11-10 NOTE — Patient Instructions (Signed)

## 2023-12-22 ENCOUNTER — Other Ambulatory Visit: Payer: Self-pay

## 2023-12-22 ENCOUNTER — Emergency Department
Admission: EM | Admit: 2023-12-22 | Discharge: 2023-12-22 | Disposition: A | Discharge status: Home / Self Care | Attending: Emergency Medicine | Admitting: Emergency Medicine

## 2023-12-22 ENCOUNTER — Encounter: Payer: Self-pay | Admitting: Emergency Medicine

## 2023-12-22 ENCOUNTER — Emergency Department

## 2023-12-22 DIAGNOSIS — R079 Chest pain, unspecified: Secondary | ICD-10-CM | POA: Diagnosis not present

## 2023-12-22 DIAGNOSIS — M25512 Pain in left shoulder: Secondary | ICD-10-CM | POA: Diagnosis not present

## 2023-12-22 DIAGNOSIS — M25511 Pain in right shoulder: Secondary | ICD-10-CM | POA: Insufficient documentation

## 2023-12-22 LAB — BASIC METABOLIC PANEL WITH GFR
Anion gap: 11 (ref 5–15)
BUN: 13 mg/dL (ref 8–23)
CO2: 27 mmol/L (ref 22–32)
Calcium: 9.5 mg/dL (ref 8.9–10.3)
Chloride: 103 mmol/L (ref 98–111)
Creatinine, Ser: 0.71 mg/dL (ref 0.44–1.00)
GFR, Estimated: >60 mL/min (ref >60)
Glucose, Bld: 113 mg/dL — ABNORMAL HIGH (ref 70–99)
Potassium: 4.1 mmol/L (ref 3.5–5.1)
Sodium: 141 mmol/L (ref 135–145)

## 2023-12-22 LAB — CBC
HCT: 38.2 % (ref 36.0–46.0)
Hemoglobin: 12.7 g/dL (ref 12.0–15.0)
MCH: 30.8 pg (ref 26.0–34.0)
MCHC: 33.2 g/dL (ref 30.0–36.0)
MCV: 92.7 fL (ref 80.0–100.0)
Platelets: 260 K/uL (ref 150–400)
RBC: 4.12 MIL/uL (ref 3.87–5.11)
RDW: 12.7 % (ref 11.5–15.5)
WBC: 7.1 K/uL (ref 4.0–10.5)
nRBC: 0 % (ref 0.0–0.2)

## 2023-12-22 LAB — TROPONIN I (HIGH SENSITIVITY): Troponin I (High Sensitivity): 3 ng/L (ref <18)

## 2023-12-22 MED ORDER — KETOROLAC TROMETHAMINE 15 MG/ML IJ SOLN
15.0000 mg | Freq: Once | INTRAMUSCULAR | Status: AC
  Administered 2023-12-22: 15 mg via INTRAMUSCULAR
  Filled 2023-12-22: qty 1

## 2023-12-22 MED ORDER — LIDOCAINE 5 % EX PTCH: 1.0000 | MEDICATED_PATCH | CUTANEOUS | 0 refills | Status: AC

## 2023-12-22 MED ORDER — LIDOCAINE 5 % EX PTCH
2.0000 | MEDICATED_PATCH | CUTANEOUS | Status: DC
  Administered 2023-12-22: 2 via TRANSDERMAL
  Filled 2023-12-22: qty 2

## 2023-12-22 NOTE — Discharge Instructions (Addendum)
 You were evaluated in the ED for shoulder and neck pain.  Your lab work is reassuring.  Your troponin level which is your heart enzyme is normal.  Your chest x-ray is normal.  We suspect this to be a musculoskeletal etiology.  Please get plenty of rest and consider applying a heating pad to the affected area.  Alternate Tylenol  and ibuprofen  as needed.  Follow-up with your primary care provider in 1 week.

## 2023-12-22 NOTE — ED Triage Notes (Signed)
 Pt to ED via POV c/o bilateral arm and shoulder pain x 2.5-3 weeks. Pt states that she is also having some pain in her neck as well. Pt denies injury to the area that she is aware of. Pt states that she does pick boxes up at work but they are not usually heavy. Pt states that the pain is intermittent, there is nothing that makes it worse or better. Pt states that the pain is an achy pain and is mostly in her right shoulder.   Pt does states that her husband did have a recent amputation so she is doing more at home but does not remember doing anything to injury herself. Pt denies cardiac hx.

## 2023-12-22 NOTE — ED Provider Notes (Signed)
 [facility] Emergency Department Provider Note     Event Date/Time   First MD Initiated Contact with Patient 12/22/23 2030     (approximate)   History   Arm Pain and Shoulder Pain   HPI  [name] is a 68 y.o. female with a history of HLD, GERD, and anxiety presents to the ED with bilateral shoulder and arm pain for the past 3 weeks associated with some neck discomfort.  She describes the pain as aching in nature more localized around her style cleft on the mastoid region bilaterally. denies injury or trauma.  Denies any cardiac history but has concerns due to father having MI.  No chest pain, shortness of breath, dizziness or palpitations.     Physical Exam   Triage Vital Signs: ED Triage Vitals  Encounter Vitals Group     BP 12/22/23 1728 (!) 148/85     Girls Systolic BP Percentile --      Girls Diastolic BP Percentile --      Boys Systolic BP Percentile --      Boys Diastolic BP Percentile --      Pulse Rate 12/22/23 1728 67     Resp 12/22/23 1728 16     Temp 12/22/23 1728 98.3 F (36.8 C)     Temp Source 12/22/23 1728 Oral     SpO2 12/22/23 1728 98 %     Weight 12/22/23 1729 178 lb (80.7 kg)     Height 12/22/23 1729 5' 2 (1.575 m)     Head Circumference --      Peak Flow --      Pain Score 12/22/23 1729 3     Pain Loc --      Pain Education --      Exclude from Growth Chart --     Most recent vital signs: Vitals:   12/22/23 1728 12/22/23 2200  BP: (!) 148/85 (!) 144/85  Pulse: 67 66  Resp: 16 16  Temp: 98.3 F (36.8 C) 98.2 F (36.8 C)  SpO2: 98% 98%    General Awake, no distress.  HEENT NCAT. CV:  Good peripheral perfusion.  RRR RESP:  Normal effort.  LCTAB ABD:  No distention.  Other:  No midline cervical tenderness.  Mild tenderness over bilateral SCM's, full ROM.  Bilateral shoulder tenderness.  No deformity noted.  Full ROM without difficulty.  Neurovascular status intact all throughout.   ED Results /  Procedures / Treatments   Labs (all labs ordered are listed, but only abnormal results are displayed) Labs Reviewed  BASIC METABOLIC PANEL WITH GFR - Abnormal; Notable for the following components:      Result Value   Glucose, Bld 113 (*)    All other components within normal limits  CBC  TROPONIN I (HIGH SENSITIVITY)   RADIOLOGY  I personally viewed and evaluated these images as part of my medical decision making, as well as reviewing the written report by the radiologist.  ED Provider Interpretation: normal  DG Chest 2 View Result Date: 12/22/2023 CLINICAL DATA:  chest pain EXAM: CHEST - 2 VIEW COMPARISON:  None Available. FINDINGS: The heart and mediastinal contours are within normal limits. No focal consolidation. No pulmonary edema. No pleural effusion. No pneumothorax. No acute osseous abnormality. IMPRESSION: No active cardiopulmonary disease. Electronically Signed   By: Morgane  Naveau M.D.   On: 12/22/2023 18:20    PROCEDURES:  Critical Care performed: No  Procedures   MEDICATIONS ORDERED IN ED:  Medications  lidocaine  (LIDODERM ) 5 % 2 patch (2 patches Transdermal Patch Applied 12/22/23 2157)  ketorolac  (TORADOL ) 15 MG/ML injection 15 mg (15 mg Intramuscular Given 12/22/23 2157)     IMPRESSION / MDM / ASSESSMENT AND PLAN / ED COURSE  I reviewed the triage vital signs and the nursing notes.                               68 y.o. female presents to the emergency department for evaluation and treatment of nontraumatic bilateral upper extremity neck pain. See HPI for further details.   Differential diagnosis includes, but is not limited to strain, ACS, MI  Patient's presentation is most consistent with acute complicated illness / injury requiring diagnostic workup.  Patient is alert and oriented.  She is hemodynamically stable.  No red flags noted.  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  Cardiac cause considered however absence of chest pain, normal EKG and normal troponin makes this less likely.  Chest x-ray normal.  ED pain management with lidocaine  patch and IM Toradol .  I do believe patient is in stable condition for discharge home and outpatient management.  Discharged with supportive care education.  ED return precaution discussed.   FINAL CLINICAL IMPRESSION(S) / ED DIAGNOSES   Final diagnoses:  Acute pain of both shoulders    Rx / DC Orders   ED Discharge Orders          Ordered    lidocaine  (LIDODERM ) 5 %  Every 24 hours        12/22/23 2212             Note:  This document was prepared using Dragon voice recognition software and may include unintentional dictation errors.    Margrette, Alvis Pulcini A, PA-C 12/23/23 0250    Jossie Artist POUR, MD 12/23/23 [phone]

## 2023-12-31 ENCOUNTER — Ambulatory Visit (INDEPENDENT_AMBULATORY_CARE_PROVIDER_SITE_OTHER): Payer: Self-pay | Admitting: Nurse Practitioner

## 2023-12-31 ENCOUNTER — Encounter: Payer: Self-pay | Admitting: Nurse Practitioner

## 2023-12-31 VITALS — BP 138/88 | HR 62 | Temp 98.0°F | Resp 16 | Ht 62.0 in | Wt 181.2 lb

## 2023-12-31 DIAGNOSIS — I4510 Unspecified right bundle-branch block: Secondary | ICD-10-CM

## 2023-12-31 DIAGNOSIS — F411 Generalized anxiety disorder: Secondary | ICD-10-CM

## 2023-12-31 DIAGNOSIS — M545 Low back pain, unspecified: Secondary | ICD-10-CM

## 2023-12-31 DIAGNOSIS — G8929 Other chronic pain: Secondary | ICD-10-CM | POA: Diagnosis not present

## 2023-12-31 DIAGNOSIS — R110 Nausea: Secondary | ICD-10-CM | POA: Diagnosis not present

## 2023-12-31 DIAGNOSIS — R41840 Attention and concentration deficit: Secondary | ICD-10-CM | POA: Diagnosis not present

## 2023-12-31 MED ORDER — AMPHETAMINE-DEXTROAMPHET ER 10 MG PO CP24: 10.0000 mg | ORAL_CAPSULE | Freq: Every day | ORAL | 0 refills | Status: DC

## 2023-12-31 MED ORDER — CLONAZEPAM 0.5 MG PO TABS: ORAL_TABLET | ORAL | 1 refills | Status: DC

## 2023-12-31 MED ORDER — ONDANSETRON 4 MG PO TBDP: 4.0000 mg | ORAL_TABLET | Freq: Three times a day (TID) | ORAL | 3 refills | Status: AC | PRN

## 2023-12-31 MED ORDER — ESCITALOPRAM OXALATE 20 MG PO TABS: 20.0000 mg | ORAL_TABLET | Freq: Every day | ORAL | 1 refills | Status: AC

## 2023-12-31 MED ORDER — BUSPIRONE HCL 10 MG PO TABS: 10.0000 mg | ORAL_TABLET | Freq: Two times a day (BID) | ORAL | 1 refills | Status: DC

## 2023-12-31 MED ORDER — CELECOXIB 200 MG PO CAPS: 200.0000 mg | ORAL_CAPSULE | Freq: Two times a day (BID) | ORAL | 1 refills | Status: DC

## 2023-12-31 NOTE — Progress Notes (Signed)
 [facility] [address] [place], [place] [address]  Internal MEDICINE  Office Visit Note  Patient Name: [name]  [id number]  [id number]  Date of Service: 12/31/2023  Chief Complaint  Patient presents with   Gastroesophageal Reflux   Hyperlipidemia   Follow-up    HPI [name] presents for a follow-up visit for  ADHD --  Recent ED visit, cardiavc workup was negative except for asymptomatic right BBB     Current Medication: Outpatient Encounter Medications as of 12/31/2023  Medication Sig   amphetamine -dextroamphetamine  (ADDERALL XR) 10 MG 24 hr capsule Take 1 capsule (10 mg total) by mouth daily.   amphetamine -dextroamphetamine  (ADDERALL XR) 10 MG 24 hr capsule Take 1 capsule (10 mg total) by mouth daily.   amphetamine -dextroamphetamine  (ADDERALL XR) 10 MG 24 hr capsule Take 1 capsule (10 mg total) by mouth daily.   busPIRone  (BUSPAR ) 10 MG tablet Take 1 tablet (10 mg total) by mouth 2 (two) times daily.   celecoxib  (CELEBREX ) 200 MG capsule Take 1 capsule (200 mg total) by mouth 2 (two) times daily.   clonazePAM  (KLONOPIN ) 0.5 MG tablet TAKE 1 TABLET(0.5 MG) BY MOUTH AT BEDTIME AS NEEDED FOR ANXIETY   clotrimazole -betamethasone  (LOTRISONE ) cream Apply 1 Application topically 2 (two) times daily. Until resolved   diphenoxylate -atropine  (LOMOTIL ) 2.5-0.025 MG tablet Take 1 tablet by mouth 4 (four) times daily as needed for diarrhea or loose stools.   escitalopram  (LEXAPRO ) 20 MG tablet Take 1 tablet (20 mg total) by mouth daily.   fenofibrate  54 MG tablet Take 1 tablet (54 mg total) by mouth daily.   fluticasone  (FLONASE ) 50 MCG/ACT nasal spray Place 2 sprays into both nostrils daily.   lidocaine  (LIDODERM ) 5 % Place 1 patch onto the skin daily for 10 days. Remove & Discard patch within 12 hours or as directed by MD   omeprazole  (PRILOSEC) 40 MG capsule Take 1 capsule (40 mg total) by mouth every morning.   ondansetron  (ZOFRAN ) 4 MG tablet Take 1 tablet (4 mg  total) by mouth every 8 (eight) hours as needed for nausea or vomiting.   rosuvastatin  (CRESTOR ) 10 MG tablet Take 1 tablet (10 mg total) by mouth daily.   sucralfate  (CARAFATE ) 1 g tablet Take 1 tablet (1 g total) by mouth 4 (four) times daily -  with meals and at bedtime.   Facility-Administered Encounter Medications as of 12/31/2023  Medication   cyanocobalamin  ((VITAMIN B-12)) injection 1,000 mcg    Surgical History: Past Surgical History:  Procedure Laterality Date   CARPAL TUNNEL RELEASE Right 11/22/2019   Procedure: CARPAL TUNNEL RELEASE;  Surgeon: Cleotilde Barrio, MD;  Location: [facility];  Service: Orthopedics;  Laterality: Right;   COLONOSCOPY     COLONOSCOPY WITH PROPOFOL  N/A 09/22/2023   Procedure: COLONOSCOPY WITH PROPOFOL ;  Surgeon: Maryruth Ole DASEN, MD;  Location: [facility];  Service: Endoscopy;  Laterality: N/A;   ESOPHAGOGASTRODUODENOSCOPY (EGD) WITH PROPOFOL  N/A 09/22/2023   Procedure: ESOPHAGOGASTRODUODENOSCOPY (EGD) WITH PROPOFOL ;  Surgeon: Maryruth Ole DASEN, MD;  Location: [facility];  Service: Endoscopy;  Laterality: N/A;   TOE SURGERY  2005,2011   ingrown toenails in doctors office   UPPER GI ENDOSCOPY      Medical History: Past Medical History:  Diagnosis Date   ADHD    Allergy    Anxiety    Colon polyp    Diverticulosis    GERD (gastroesophageal reflux disease)    Hyperlipidemia     Family History: Family History  Problem Relation Age of  Onset   Lung cancer Mother    Heart disease Father    Pancreatic cancer Sister     Social History   Socioeconomic History   Marital status: Married    Spouse name: Not on file   Number of children: Not on file   Years of education: Not on file   Highest education level: Not on file  Occupational History   Not on file  Tobacco Use   Smoking status: Never   Smokeless tobacco: Never  Vaping Use   Vaping status: Never Used  Substance and Sexual Activity   Alcohol use: Yes    Comment:  ocassionally    Drug use: Yes    Types: Marijuana    Comment: daily use   Sexual activity: Not on file  Other Topics Concern   Not on file  Social History Narrative   Not on file   Social Drivers of Health   Financial Resource Strain: Not on file  Food Insecurity: Not on file  Transportation Needs: Not on file  Physical Activity: Not on file  Stress: Not on file  Social Connections: Not on file  Intimate Partner Violence: Not on file      Review of Systems  Vital Signs: BP 138/88   Pulse 62   Temp 98 F (36.7 C)   Resp 16   Ht 5' 2 (1.575 m)   Wt 181 lb 3.2 oz (82.2 kg)   SpO2 99%   BMI 33.14 kg/m    Physical Exam     Assessment/Plan:   General Counseling: [name] verbalizes understanding of the findings of todays visit and agrees with plan of treatment. I have discussed any further diagnostic evaluation that may be needed or ordered today. We also reviewed her medications today. she has been encouraged to call the office with any questions or concerns that should arise related to todays visit.    No orders of the defined types were placed in this encounter.   No orders of the defined types were placed in this encounter.   No follow-ups on file.   Total time spent:*** Minutes Time spent includes review of chart, medications, test results, and follow up plan with the patient.   [place] Controlled Substance Database was reviewed by me.  This patient was seen by Mardy Maxin, FNP-C in collaboration with Dr. Sigrid Bathe as a part of collaborative care agreement.   Bianey Tesoro R. Maxin, MSN, FNP-C Internal medicine

## 2024-01-01 ENCOUNTER — Encounter: Payer: Self-pay | Admitting: Nurse Practitioner

## 2024-01-05 DIAGNOSIS — D2271 Melanocytic nevi of right lower limb, including hip: Secondary | ICD-10-CM | POA: Diagnosis not present

## 2024-01-05 DIAGNOSIS — D225 Melanocytic nevi of trunk: Secondary | ICD-10-CM | POA: Diagnosis not present

## 2024-01-05 DIAGNOSIS — D2261 Melanocytic nevi of right upper limb, including shoulder: Secondary | ICD-10-CM | POA: Diagnosis not present

## 2024-01-05 DIAGNOSIS — M71371 Other bursal cyst, right ankle and foot: Secondary | ICD-10-CM | POA: Diagnosis not present

## 2024-01-05 DIAGNOSIS — L538 Other specified erythematous conditions: Secondary | ICD-10-CM | POA: Diagnosis not present

## 2024-01-05 DIAGNOSIS — D2262 Melanocytic nevi of left upper limb, including shoulder: Secondary | ICD-10-CM | POA: Diagnosis not present

## 2024-01-05 DIAGNOSIS — D2272 Melanocytic nevi of left lower limb, including hip: Secondary | ICD-10-CM | POA: Diagnosis not present

## 2024-01-05 DIAGNOSIS — R208 Other disturbances of skin sensation: Secondary | ICD-10-CM | POA: Diagnosis not present

## 2024-01-05 DIAGNOSIS — L820 Inflamed seborrheic keratosis: Secondary | ICD-10-CM | POA: Diagnosis not present

## 2024-01-05 DIAGNOSIS — L821 Other seborrheic keratosis: Secondary | ICD-10-CM | POA: Diagnosis not present

## 2024-01-08 ENCOUNTER — Telehealth: Payer: Self-pay

## 2024-01-08 DIAGNOSIS — M79662 Pain in left lower leg: Secondary | ICD-10-CM | POA: Diagnosis not present

## 2024-01-08 NOTE — Telephone Encounter (Signed)
 Pt called that her left leg is hurting and ist bruise and touch to warm  and its painful  as per alyssa advised her to go to Urgent care

## 2024-02-03 ENCOUNTER — Other Ambulatory Visit: Payer: Self-pay

## 2024-02-03 ENCOUNTER — Telehealth: Payer: Self-pay

## 2024-02-03 MED ORDER — AZITHROMYCIN 250 MG PO TABS: ORAL_TABLET | ORAL | 0 refills | Status: DC

## 2024-02-03 NOTE — Telephone Encounter (Signed)
 As per [name] sent ZPak and advised her to used Flonase  and OTC Mucinex or delsym

## 2024-02-23 NOTE — Progress Notes (Signed)
   02/23/2024  Patient ID: [name], female   DOB: [birth year], 68 y.o.   MRN: [id number]  Pharmacy Quality Measure Review  This patient is appearing on a report for being at risk of failing the adherence measure for cholesterol (statin) medications this calendar year.   Medication: Rosuvastatin  Last fill date: 01/06/24 for 90 day supply  Insurance report was not up to date. No action needed at this time.   Jon VEAR Lindau, PharmD Clinical Pharmacist [phone]

## 2024-02-27 ENCOUNTER — Telehealth: Payer: Self-pay

## 2024-02-27 DIAGNOSIS — R41840 Attention and concentration deficit: Secondary | ICD-10-CM

## 2024-03-02 MED ORDER — AMPHETAMINE-DEXTROAMPHET ER 10 MG PO CP24: 10.0000 mg | ORAL_CAPSULE | Freq: Every day | ORAL | 0 refills | Status: DC

## 2024-03-02 NOTE — Telephone Encounter (Signed)
 Left patient a message about her medicine.

## 2024-03-02 NOTE — Telephone Encounter (Signed)
 Med sent to pharmacy.

## 2024-04-06 ENCOUNTER — Ambulatory Visit (INDEPENDENT_AMBULATORY_CARE_PROVIDER_SITE_OTHER): Payer: Managed Care, Other (non HMO) | Admitting: Nurse Practitioner

## 2024-04-06 ENCOUNTER — Encounter: Payer: Self-pay | Admitting: Nurse Practitioner

## 2024-04-06 VITALS — BP 134/82 | HR 63 | Temp 96.1°F | Resp 16 | Ht 62.0 in | Wt 176.0 lb

## 2024-04-06 DIAGNOSIS — E538 Deficiency of other specified B group vitamins: Secondary | ICD-10-CM

## 2024-04-06 DIAGNOSIS — R41840 Attention and concentration deficit: Secondary | ICD-10-CM | POA: Diagnosis not present

## 2024-04-06 DIAGNOSIS — E559 Vitamin D deficiency, unspecified: Secondary | ICD-10-CM | POA: Diagnosis not present

## 2024-04-06 DIAGNOSIS — Z0001 Encounter for general adult medical examination with abnormal findings: Secondary | ICD-10-CM

## 2024-04-06 DIAGNOSIS — R300 Dysuria: Secondary | ICD-10-CM

## 2024-04-06 DIAGNOSIS — F411 Generalized anxiety disorder: Secondary | ICD-10-CM | POA: Diagnosis not present

## 2024-04-06 DIAGNOSIS — Z152 Genetic susceptibility to obesity: Secondary | ICD-10-CM

## 2024-04-06 DIAGNOSIS — E66811 Obesity, class 1: Secondary | ICD-10-CM | POA: Diagnosis not present

## 2024-04-06 DIAGNOSIS — J300 Vasomotor rhinitis: Secondary | ICD-10-CM

## 2024-04-06 DIAGNOSIS — E8882 Obesity due to disruption of MC4R pathway: Secondary | ICD-10-CM

## 2024-04-06 DIAGNOSIS — Z23 Encounter for immunization: Secondary | ICD-10-CM | POA: Diagnosis not present

## 2024-04-06 DIAGNOSIS — R7303 Prediabetes: Secondary | ICD-10-CM | POA: Diagnosis not present

## 2024-04-06 DIAGNOSIS — Z6832 Body mass index (BMI) 32.0-32.9, adult: Secondary | ICD-10-CM

## 2024-04-06 DIAGNOSIS — E782 Mixed hyperlipidemia: Secondary | ICD-10-CM

## 2024-04-06 MED ORDER — AMPHETAMINE-DEXTROAMPHET ER 10 MG PO CP24
10.0000 mg | ORAL_CAPSULE | Freq: Every day | ORAL | 0 refills | Status: AC
Start: 2024-06-01 — End: ?

## 2024-04-06 MED ORDER — AMPHETAMINE-DEXTROAMPHET ER 10 MG PO CP24
10.0000 mg | ORAL_CAPSULE | Freq: Every day | ORAL | 0 refills | Status: AC
Start: 2024-04-06 — End: ?

## 2024-04-06 MED ORDER — ONDANSETRON 4 MG PO TBDP: 4.0000 mg | ORAL_TABLET | Freq: Three times a day (TID) | ORAL | 3 refills | Status: AC | PRN

## 2024-04-06 MED ORDER — AMPHETAMINE-DEXTROAMPHET ER 10 MG PO CP24: 10.0000 mg | ORAL_CAPSULE | Freq: Every day | ORAL | 0 refills | Status: AC

## 2024-04-06 MED ORDER — FLUTICASONE PROPIONATE 50 MCG/ACT NA SUSP
2.0000 | Freq: Every day | NASAL | 3 refills | Status: AC
Start: 2024-04-06 — End: ?

## 2024-04-06 MED ORDER — PNEUMOCOCCAL 20-VAL CONJ VACC 0.5 ML IM SUSY: 0.5000 mL | PREFILLED_SYRINGE | Freq: Once | INTRAMUSCULAR | 0 refills | Status: AC | PRN

## 2024-04-06 MED ORDER — CLONAZEPAM 0.5 MG PO TABS: ORAL_TABLET | ORAL | 1 refills | Status: AC

## 2024-04-06 NOTE — Progress Notes (Signed)
 [facility] [address] [place], [place] [address]  Internal MEDICINE  Office Visit Note  Patient Name: [name]  [id number]  [id number]  Date of Service: 04/06/2024  Chief Complaint  Patient presents with   Gastroesophageal Reflux   Hyperlipidemia   Annual Exam    HPI [name] presents for a medicare annual wellness visit.  Well-appearing 68 y.o. female with ADHD, low back pain, GERD, OSA, GAD, high cholesterol, and rhinitis.  Routine CRC screening: done in April this year, due again in 5 years in 2030.  Routine mammogram: done in January this year DEXA scan: done in January this year and result was normal Labs: due for routine labs  Due for pneumonia vaccine  New or worsening pain: chronic joint pains, takes celebrex   Other concerns: none   The 10-year ASCVD risk score (Arnett DK, et al., 2019) is: 8.5%   Values used to calculate the score:     Age: 12 years     Clincally relevant sex: Female     Is Non-Hispanic African American: No     Diabetic: No     Tobacco smoker: No     Systolic Blood Pressure: 134 mmHg     Is BP treated: No     HDL Cholesterol: 47 mg/dL     Total Cholesterol: 184 mg/dL        88/10/7972    0:79 AM  MMSE - Mini Mental State Exam  Orientation to time 5  Orientation to Place 5  Registration 3  Attention/ Calculation 5  Recall 2  Language- name 2 objects 2  Language- repeat 1  Language- follow 3 step command 3  Language- read & follow direction 1  Write a sentence 1  Copy design 1  Total score 29    Functional Status Survey: Is the patient deaf or have difficulty hearing?: No Does the patient have difficulty seeing, even when wearing glasses/contacts?: No Does the patient have difficulty concentrating, remembering, or making decisions?: Yes Does the patient have difficulty walking or climbing stairs?: No Does the patient have difficulty dressing or bathing?: No Does the patient have difficulty doing errands alone  such as visiting a doctor's office or shopping?: No     06/24/2022   10:51 AM 03/11/2023    8:44 AM 04/01/2023    9:57 AM 04/06/2024    9:03 AM 04/06/2024    9:16 AM  Fall Risk  Falls in the past year? 0 0 1 0   Was there an injury with Fall? 0  0 0   Fall Risk Category Calculator 0  1 0   Patient at Risk for Falls Due to No Fall Risks No Fall Risks  No Fall Risks No Fall Risks  Fall risk Follow up Falls evaluation completed  Falls evaluation completed Falls evaluation completed Falls evaluation completed Falls evaluation completed     Data saved with a previous flowsheet row definition       04/06/2024    9:03 AM  Depression screen PHQ 2/9  Decreased Interest 0  Down, Depressed, Hopeless 0  PHQ - 2 Score 0       Current Medication: Outpatient Encounter Medications as of 04/06/2024  Medication Sig   ondansetron  (ZOFRAN -ODT) 4 MG disintegrating tablet Take 1 tablet (4 mg total) by mouth every 8 (eight) hours as needed for nausea or vomiting.   pneumococcal 20-valent conjugate vaccine (PREVNAR 20) 0.5 ML injection Inject 0.5 mLs into the muscle once as needed for  up to 1 dose.   [START ON 06/01/2024] amphetamine -dextroamphetamine  (ADDERALL XR) 10 MG 24 hr capsule Take 1 capsule (10 mg total) by mouth daily.   [START ON 05/04/2024] amphetamine -dextroamphetamine  (ADDERALL XR) 10 MG 24 hr capsule Take 1 capsule (10 mg total) by mouth daily.   amphetamine -dextroamphetamine  (ADDERALL XR) 10 MG 24 hr capsule Take 1 capsule (10 mg total) by mouth daily.   busPIRone  (BUSPAR ) 10 MG tablet Take 1 tablet (10 mg total) by mouth 2 (two) times daily.   celecoxib  (CELEBREX ) 200 MG capsule Take 1 capsule (200 mg total) by mouth 2 (two) times daily.   clonazePAM  (KLONOPIN ) 0.5 MG tablet TAKE 1 TABLET(0.5 MG) BY MOUTH AT BEDTIME AS NEEDED FOR ANXIETY   escitalopram  (LEXAPRO ) 20 MG tablet Take 1 tablet (20 mg total) by mouth daily.   fenofibrate  54 MG tablet Take 1 tablet (54 mg total) by mouth daily.    fluticasone  (FLONASE ) 50 MCG/ACT nasal spray Place 2 sprays into both nostrils daily.   omeprazole  (PRILOSEC) 40 MG capsule Take 1 capsule (40 mg total) by mouth every morning.   ondansetron  (ZOFRAN -ODT) 4 MG disintegrating tablet Take 1 tablet (4 mg total) by mouth every 8 (eight) hours as needed for nausea or vomiting.   rosuvastatin  (CRESTOR ) 10 MG tablet Take 1 tablet (10 mg total) by mouth daily.   sucralfate  (CARAFATE ) 1 g tablet Take 1 tablet (1 g total) by mouth 4 (four) times daily -  with meals and at bedtime.   [DISCONTINUED] amphetamine -dextroamphetamine  (ADDERALL XR) 10 MG 24 hr capsule Take 1 capsule (10 mg total) by mouth daily.   [DISCONTINUED] amphetamine -dextroamphetamine  (ADDERALL XR) 10 MG 24 hr capsule Take 1 capsule (10 mg total) by mouth daily.   [DISCONTINUED] amphetamine -dextroamphetamine  (ADDERALL XR) 10 MG 24 hr capsule Take 1 capsule (10 mg total) by mouth daily.   [DISCONTINUED] azithromycin  (ZITHROMAX ) 250 MG tablet Use as directed for 5 days (Patient not taking: Reported on 04/06/2024)   [DISCONTINUED] clonazePAM  (KLONOPIN ) 0.5 MG tablet TAKE 1 TABLET(0.5 MG) BY MOUTH AT BEDTIME AS NEEDED FOR ANXIETY   [DISCONTINUED] clotrimazole -betamethasone  (LOTRISONE ) cream Apply 1 Application topically 2 (two) times daily. Until resolved (Patient not taking: Reported on 04/06/2024)   [DISCONTINUED] fluticasone  (FLONASE ) 50 MCG/ACT nasal spray Place 2 sprays into both nostrils daily.   Facility-Administered Encounter Medications as of 04/06/2024  Medication   cyanocobalamin  ((VITAMIN B-12)) injection 1,000 mcg    Surgical History: Past Surgical History:  Procedure Laterality Date   CARPAL TUNNEL RELEASE Right 11/22/2019   Procedure: CARPAL TUNNEL RELEASE;  Surgeon: Cleotilde Barrio, MD;  Location: [facility];  Service: Orthopedics;  Laterality: Right;   COLONOSCOPY     COLONOSCOPY WITH PROPOFOL  N/A 09/22/2023   Procedure: COLONOSCOPY WITH PROPOFOL ;  Surgeon: Maryruth Ole DASEN,  MD;  Location: [facility];  Service: Endoscopy;  Laterality: N/A;   ESOPHAGOGASTRODUODENOSCOPY (EGD) WITH PROPOFOL  N/A 09/22/2023   Procedure: ESOPHAGOGASTRODUODENOSCOPY (EGD) WITH PROPOFOL ;  Surgeon: Maryruth Ole DASEN, MD;  Location: [facility];  Service: Endoscopy;  Laterality: N/A;   TOE SURGERY  2005,2011   ingrown toenails in doctors office   UPPER GI ENDOSCOPY      Medical History: Past Medical History:  Diagnosis Date   ADHD    Allergy    Anxiety    Colon polyp    Diverticulosis    GERD (gastroesophageal reflux disease)    Hyperlipidemia     Family History: Family History  Problem Relation Age of Onset   Lung cancer Mother  Heart disease Father    Pancreatic cancer Sister     Social History   Socioeconomic History   Marital status: Married    Spouse name: Not on file   Number of children: Not on file   Years of education: Not on file   Highest education level: Not on file  Occupational History   Not on file  Tobacco Use   Smoking status: Never   Smokeless tobacco: Never  Vaping Use   Vaping status: Never Used  Substance and Sexual Activity   Alcohol use: Yes    Comment: ocassionally    Drug use: Yes    Types: Marijuana    Comment: daily use   Sexual activity: Not on file  Other Topics Concern   Not on file  Social History Narrative   Not on file   Social Drivers of Health   Financial Resource Strain: Low Risk  (01/08/2024)   Received from [facility]   Overall Financial Resource Strain (CARDIA)    Difficulty of Paying Living Expenses: Not very hard  Food Insecurity: No Food Insecurity (01/08/2024)   Received from [facility]   Hunger Vital Sign    Within the past 12 months, you worried that your food would run out before you got the money to buy more.: Never true    Within the past 12 months, the food you bought just didn't last and you didn't have money to get more.: Never true  Transportation Needs:  No Transportation Needs (01/08/2024)   Received from [facility] - [facility]    In the past 12 months, has lack of transportation kept you from medical appointments or from getting medications?: No    Lack of Transportation (Non-Medical): No  Physical Activity: Not on file  Stress: Not on file  Social Connections: Not on file  Intimate Partner Violence: Not on file      Review of Systems  Constitutional:  Positive for fatigue. Negative for activity change, appetite change, chills, fever and unexpected weight change.  HENT: Negative.  Negative for congestion, ear pain, rhinorrhea, sore throat and trouble swallowing.   Eyes: Negative.   Respiratory: Negative.  Negative for cough, chest tightness, shortness of breath and wheezing.   Cardiovascular: Negative.  Negative for chest pain.  Gastrointestinal: Negative.  Negative for abdominal pain, blood in stool, constipation, diarrhea, nausea and vomiting.  Endocrine: Negative.   Genitourinary: Negative.  Negative for difficulty urinating, dysuria, frequency, hematuria and urgency.  Musculoskeletal:  Positive for arthralgias and back pain. Negative for joint swelling, myalgias and neck pain.  Skin:  Negative for rash.  Allergic/Immunologic: Negative.  Negative for immunocompromised state.  Neurological: Negative.  Negative for dizziness, seizures, numbness and headaches.  Hematological: Negative.   Psychiatric/Behavioral:  Positive for decreased concentration. Negative for behavioral problems, self-injury, sleep disturbance and suicidal ideas. The patient is nervous/anxious.     Vital Signs: BP 134/82   Pulse 63   Temp (!) 96.1 F (35.6 C)   Resp 16   Ht 5' 2 (1.575 m)   Wt 176 lb (79.8 kg) Comment: at home  SpO2 98%   BMI 32.19 kg/m    Physical Exam Vitals reviewed.  Constitutional:      General: She is not in acute distress.    Appearance: Normal appearance. She is well-developed. She is obese.  She is not ill-appearing or diaphoretic.  HENT:     Head: Normocephalic and atraumatic.  Right Ear: Tympanic membrane, ear canal and external ear normal.     Left Ear: Tympanic membrane, ear canal and external ear normal.     Nose: Nose normal. No congestion or rhinorrhea.     Mouth/Throat:     Mouth: Mucous membranes are moist.     Pharynx: Oropharynx is clear. No oropharyngeal exudate or posterior oropharyngeal erythema.  Eyes:     General: No scleral icterus.       Right eye: No discharge.        Left eye: No discharge.     Extraocular Movements: Extraocular movements intact.     Conjunctiva/sclera: Conjunctivae normal.     Pupils: Pupils are equal, round, and reactive to light.  Neck:     Thyroid: No thyromegaly.     Vascular: No JVD.     Trachea: No tracheal deviation.  Cardiovascular:     Rate and Rhythm: Normal rate and regular rhythm.     Heart sounds: Normal heart sounds. No murmur heard.    No friction rub. No gallop.  Pulmonary:     Effort: Pulmonary effort is normal. No respiratory distress.     Breath sounds: Normal breath sounds. No stridor. No wheezing or rales.  Chest:     Chest wall: No tenderness.  Abdominal:     General: Bowel sounds are normal. There is no distension.     Palpations: Abdomen is soft. There is no mass.     Tenderness: There is no abdominal tenderness. There is no guarding or rebound.  Musculoskeletal:        General: No tenderness or deformity. Normal range of motion.     Cervical back: Normal range of motion and neck supple.  Lymphadenopathy:     Cervical: No cervical adenopathy.  Skin:    General: Skin is warm and dry.     Capillary Refill: Capillary refill takes less than 2 seconds.     Coloration: Skin is not pale.     Findings: No erythema or rash.  Neurological:     Mental Status: She is alert and oriented to person, place, and time.     Cranial Nerves: No cranial nerve deficit.     Motor: No abnormal muscle tone.      Coordination: Coordination normal.     Deep Tendon Reflexes: Reflexes are normal and symmetric.  Psychiatric:        Mood and Affect: Mood normal.        Behavior: Behavior normal.        Thought Content: Thought content normal.        Judgment: Judgment normal.        Assessment/Plan: 1. Encounter for Medicare annual examination with abnormal findings (Primary) Age-appropriate preventive screenings and vaccinations discussed. Routine labs for health maintenance ordered, see below. PHM updated.  Medication list reviewed, updated and refills ordered.  - ondansetron  (ZOFRAN -ODT) 4 MG disintegrating tablet; Take 1 tablet (4 mg total) by mouth every 8 (eight) hours as needed for nausea or vomiting.  Dispense: 90 tablet; Refill: 3 - CBC with Differential/Platelet - CMP14+EGFR - Lipid Profile - Hgb A1C w/o eAG - B12 and Folate Panel - Vitamin D  (25 hydroxy)  2. Vasomotor rhinitis Continue flonase  nasal spray as prescribed.  - fluticasone  (FLONASE ) 50 MCG/ACT nasal spray; Place 2 sprays into both nostrils daily.  Dispense: 48 g; Refill: 3  3. Prediabetes Routine labs ordered  - CBC with Differential/Platelet - CMP14+EGFR - Lipid Profile - Hgb A1C w/o eAG  4. Mixed hyperlipidemia Routine labs ordered. Continue fenofibrate  as prescribed  - CBC with Differential/Platelet - CMP14+EGFR - Lipid Profile - Hgb A1C w/o eAG  5. B12 deficiency Routine labs ordered  - CBC with Differential/Platelet - B12 and Folate Panel  6. 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   8. Dysuria Urine sent to lab - UA/M w/rflx Culture, Routine - Microscopic Examination  9. Attention and concentration deficit Continue adderall as prescribed. Follow up in 3 months for refills  and UDS due at next visit.  - amphetamine -dextroamphetamine  (ADDERALL XR) 10 MG 24 hr capsule; Take 1 capsule (10 mg total) by mouth daily.  Dispense: 30 capsule; Refill: 0 - amphetamine -dextroamphetamine  (ADDERALL XR) 10 MG 24 hr capsule; Take 1 capsule (10 mg total) by mouth daily.  Dispense: 30 capsule; Refill: 0 - amphetamine -dextroamphetamine  (ADDERALL XR) 10 MG 24 hr capsule; Take 1 capsule (10 mg total) by mouth daily.  Dispense: 30 capsule; Refill: 0  10. Need for vaccination against Streptococcus pneumoniae - pneumococcal 20-valent conjugate vaccine (PREVNAR 20) 0.5 ML injection; Inject 0.5 mLs into the muscle once as needed for up to 1 dose.  Dispense: 0.5 mL; Refill: 0  11. GAD (generalized anxiety disorder) Continue clonazepam  as prescribed. Follow up for refills as needed  - clonazePAM  (KLONOPIN ) 0.5 MG tablet; TAKE 1 TABLET(0.5 MG) BY MOUTH AT BEDTIME AS NEEDED FOR ANXIETY  Dispense: 20 tablet; Refill: 1     General Counseling: [name] verbalizes understanding of the findings of todays visit and agrees with plan of treatment. I have discussed any further diagnostic evaluation that may be needed or ordered today. We also reviewed her medications today. she has been encouraged to call the office with any questions or concerns that should arise related to todays visit.    Orders Placed This Encounter  Procedures   Pneumococcal conjugate vaccine 20-valent (PCV20)   CBC with Differential/Platelet   CMP14+EGFR   Lipid Profile   Hgb A1C w/o eAG   B12 and Folate Panel   Vitamin D  (25 hydroxy)    Meds ordered this encounter  Medications   amphetamine -dextroamphetamine  (ADDERALL XR) 10 MG 24 hr capsule    Sig: Take 1 capsule (10 mg total) by mouth daily.    Dispense:  30 capsule    Refill:  0    Fill for December 30   amphetamine -dextroamphetamine  (ADDERALL XR) 10 MG 24 hr capsule    Sig: Take 1 capsule (10 mg total) by mouth daily.    Dispense:  30 capsule    Refill:  0     Fill for December 2   amphetamine -dextroamphetamine  (ADDERALL XR) 10 MG 24 hr capsule    Sig: Take 1 capsule (10 mg total) by mouth daily.    Dispense:  30 capsule    Refill:  0    Fill for november   clonazePAM  (KLONOPIN ) 0.5 MG tablet    Sig: TAKE 1 TABLET(0.5 MG) BY MOUTH AT BEDTIME AS NEEDED FOR ANXIETY    Dispense:  20 tablet    Refill:  1   ondansetron  (ZOFRAN -ODT) 4 MG disintegrating tablet    Sig: Take 1 tablet (4 mg total) by mouth every 8 (eight) hours as needed for nausea or vomiting.    Dispense:  90 tablet  Refill:  3    Fill new script today   fluticasone  (FLONASE ) 50 MCG/ACT nasal spray    Sig: Place 2 sprays into both nostrils daily.    Dispense:  48 g    Refill:  3    This is for 90 day prescription.   pneumococcal 20-valent conjugate vaccine (PREVNAR 20) 0.5 ML injection    Sig: Inject 0.5 mLs into the muscle once as needed for up to 1 dose.    Dispense:  0.5 mL    Refill:  0    Due for prevnar 20    Return in about 3 months (around 06/30/2024) for F/U, ADHD med check, Rula [place] PCP, and have labs done prior to visit in january. .   Total time spent:30 Minutes Time spent includes review of chart, medications, test results, and follow up plan with the patient.   [place] Controlled Substance Database was reviewed by me.  This patient was seen by Mardy Maxin, FNP-C in collaboration with Dr. Sigrid Bathe as a part of collaborative care agreement.  Naiyana Barbian R. Maxin, MSN, FNP-C Internal medicine

## 2024-04-07 LAB — UA/M W/RFLX CULTURE, ROUTINE
Bilirubin, UA: NEGATIVE
Glucose, UA: NEGATIVE
Ketones, UA: NEGATIVE
Leukocytes,UA: NEGATIVE
Nitrite, UA: NEGATIVE
Protein,UA: NEGATIVE
RBC, UA: NEGATIVE
Specific Gravity, UA: 1.019 (ref 1.005–1.030)
Urobilinogen, Ur: 0.2 mg/dL (ref 0.2–1.0)
pH, UA: 5.5 (ref 5.0–7.5)

## 2024-04-07 LAB — MICROSCOPIC EXAMINATION
Casts: NONE SEEN /LPF
RBC, Urine: NONE SEEN /HPF (ref 0–2)
WBC, UA: NONE SEEN /HPF (ref 0–5)

## 2024-04-09 NOTE — Progress Notes (Signed)
   04/09/2024  Patient ID: [name], female   DOB: [birth year], 68 y.o.   MRN: [id number]  Pharmacy Quality Measure Review  This patient is appearing on a report for being at risk of failing the adherence measure for cholesterol (statin) medications this calendar year.   Medication: Rosuvastatin  Last fill date: 03/19/24/25 for 90 day supply  Insurance report was not up to date. No action needed at this time.   Jon VEAR Lindau, PharmD Clinical Pharmacist [phone]

## 2024-04-27 ENCOUNTER — Encounter: Payer: Self-pay | Admitting: Nurse Practitioner

## 2024-04-27 DIAGNOSIS — E559 Vitamin D deficiency, unspecified: Secondary | ICD-10-CM | POA: Insufficient documentation

## 2024-04-27 DIAGNOSIS — R7303 Prediabetes: Secondary | ICD-10-CM | POA: Insufficient documentation

## 2024-06-10 ENCOUNTER — Other Ambulatory Visit: Payer: Self-pay | Admitting: Nurse Practitioner

## 2024-06-10 DIAGNOSIS — Z1231 Encounter for screening mammogram for malignant neoplasm of breast: Secondary | ICD-10-CM

## 2024-06-29 ENCOUNTER — Encounter

## 2024-06-29 ENCOUNTER — Ambulatory Visit: Admitting: Nurse Practitioner

## 2024-07-01 ENCOUNTER — Other Ambulatory Visit: Payer: Self-pay | Admitting: Nurse Practitioner

## 2024-07-01 DIAGNOSIS — F411 Generalized anxiety disorder: Secondary | ICD-10-CM

## 2024-07-01 DIAGNOSIS — G8929 Other chronic pain: Secondary | ICD-10-CM

## 2025-04-07 ENCOUNTER — Encounter: Admitting: Nurse Practitioner
